# Patient Record
Sex: Male | Born: 1955
Health system: Southern US, Community
[De-identification: ages and names within clinical notes are randomized; demographics above are authoritative.]

## PROBLEM LIST (undated history)

## (undated) DIAGNOSIS — F909 Attention-deficit hyperactivity disorder, unspecified type: Secondary | ICD-10-CM

## (undated) DIAGNOSIS — M1990 Unspecified osteoarthritis, unspecified site: Secondary | ICD-10-CM

## (undated) DIAGNOSIS — N529 Male erectile dysfunction, unspecified: Secondary | ICD-10-CM

## (undated) DIAGNOSIS — T7840XA Allergy, unspecified, initial encounter: Secondary | ICD-10-CM

## (undated) DIAGNOSIS — K648 Other hemorrhoids: Secondary | ICD-10-CM

## (undated) DIAGNOSIS — G44209 Tension-type headache, unspecified, not intractable: Secondary | ICD-10-CM

## (undated) DIAGNOSIS — Z8669 Personal history of other diseases of the nervous system and sense organs: Secondary | ICD-10-CM

## (undated) DIAGNOSIS — K602 Anal fissure, unspecified: Secondary | ICD-10-CM

## (undated) DIAGNOSIS — G479 Sleep disorder, unspecified: Secondary | ICD-10-CM

## (undated) DIAGNOSIS — K649 Unspecified hemorrhoids: Secondary | ICD-10-CM

## (undated) DIAGNOSIS — R480 Dyslexia and alexia: Secondary | ICD-10-CM

## (undated) HISTORY — DX: Tension-type headache, unspecified, not intractable: G44.209

## (undated) HISTORY — DX: Allergy, unspecified, initial encounter: T78.40XA

## (undated) HISTORY — DX: Anal fissure, unspecified: K60.2

## (undated) HISTORY — DX: Sleep disorder, unspecified: G47.9

## (undated) HISTORY — DX: Unspecified hemorrhoids: K64.9

## (undated) HISTORY — DX: Unspecified osteoarthritis, unspecified site: M19.90

## (undated) HISTORY — DX: Personal history of other diseases of the nervous system and sense organs: Z86.69

## (undated) HISTORY — DX: Male erectile dysfunction, unspecified: N52.9

## (undated) HISTORY — PX: HEMORRHOID BANDING: SHX5850

## (undated) HISTORY — DX: Other hemorrhoids: K64.8

## (undated) HISTORY — PX: TONSILLECTOMY: SUR1361

## (undated) HISTORY — DX: Attention-deficit hyperactivity disorder, unspecified type: F90.9

## (undated) HISTORY — PX: APPENDECTOMY: SHX54

## (undated) HISTORY — DX: Dyslexia and alexia: R48.0

---

## 1998-03-19 ENCOUNTER — Emergency Department (HOSPITAL_COMMUNITY): Admission: EM | Admit: 1998-03-19 | Discharge: 1998-03-19 | Payer: Self-pay | Admitting: Emergency Medicine

## 2002-10-13 ENCOUNTER — Encounter: Admission: RE | Admit: 2002-10-13 | Discharge: 2002-10-13 | Payer: Self-pay | Admitting: Family Medicine

## 2002-10-13 ENCOUNTER — Encounter: Payer: Self-pay | Admitting: Family Medicine

## 2004-08-24 HISTORY — PX: COLONOSCOPY: SHX174

## 2005-01-07 ENCOUNTER — Ambulatory Visit: Payer: Self-pay | Admitting: Gastroenterology

## 2005-01-27 ENCOUNTER — Ambulatory Visit: Payer: Self-pay | Admitting: Gastroenterology

## 2005-02-05 ENCOUNTER — Ambulatory Visit: Payer: Self-pay | Admitting: Gastroenterology

## 2005-02-05 LAB — HM COLONOSCOPY: HM Colonoscopy: NORMAL

## 2006-03-19 ENCOUNTER — Ambulatory Visit: Payer: Self-pay | Admitting: Family Medicine

## 2006-04-20 ENCOUNTER — Ambulatory Visit: Payer: Self-pay | Admitting: Family Medicine

## 2006-08-27 ENCOUNTER — Ambulatory Visit: Payer: Self-pay | Admitting: Family Medicine

## 2006-09-14 ENCOUNTER — Ambulatory Visit: Payer: Self-pay | Admitting: Family Medicine

## 2006-11-22 ENCOUNTER — Ambulatory Visit: Payer: Self-pay | Admitting: Family Medicine

## 2007-06-30 ENCOUNTER — Ambulatory Visit: Payer: Self-pay | Admitting: Family Medicine

## 2007-07-18 ENCOUNTER — Ambulatory Visit: Payer: Self-pay | Admitting: Family Medicine

## 2007-09-06 ENCOUNTER — Ambulatory Visit: Payer: Self-pay | Admitting: Family Medicine

## 2007-09-21 ENCOUNTER — Ambulatory Visit: Payer: Self-pay | Admitting: Family Medicine

## 2007-10-12 ENCOUNTER — Ambulatory Visit: Payer: Self-pay | Admitting: Family Medicine

## 2007-10-27 ENCOUNTER — Ambulatory Visit: Payer: Self-pay | Admitting: Family Medicine

## 2008-02-17 ENCOUNTER — Ambulatory Visit: Payer: Self-pay | Admitting: Family Medicine

## 2008-08-28 ENCOUNTER — Ambulatory Visit: Payer: Self-pay | Admitting: Family Medicine

## 2008-09-04 ENCOUNTER — Ambulatory Visit: Payer: Self-pay | Admitting: Family Medicine

## 2008-11-13 ENCOUNTER — Ambulatory Visit: Payer: Self-pay | Admitting: Family Medicine

## 2009-01-25 ENCOUNTER — Ambulatory Visit: Payer: Self-pay | Admitting: Family Medicine

## 2009-02-15 ENCOUNTER — Ambulatory Visit: Payer: Self-pay | Admitting: Family Medicine

## 2009-02-19 ENCOUNTER — Telehealth: Payer: Self-pay | Admitting: Gastroenterology

## 2009-02-20 ENCOUNTER — Ambulatory Visit: Payer: Self-pay | Admitting: Gastroenterology

## 2009-02-20 DIAGNOSIS — K625 Hemorrhage of anus and rectum: Secondary | ICD-10-CM | POA: Insufficient documentation

## 2009-02-20 DIAGNOSIS — K602 Anal fissure, unspecified: Secondary | ICD-10-CM | POA: Insufficient documentation

## 2009-05-22 ENCOUNTER — Ambulatory Visit: Payer: Self-pay | Admitting: Family Medicine

## 2009-07-01 ENCOUNTER — Ambulatory Visit: Payer: Self-pay | Admitting: Family Medicine

## 2009-07-15 ENCOUNTER — Ambulatory Visit: Payer: Self-pay | Admitting: Family Medicine

## 2009-12-20 ENCOUNTER — Ambulatory Visit: Payer: Self-pay | Admitting: Family Medicine

## 2010-03-11 ENCOUNTER — Ambulatory Visit: Payer: Self-pay | Admitting: Family Medicine

## 2010-07-08 ENCOUNTER — Ambulatory Visit: Payer: Self-pay | Admitting: Family Medicine

## 2010-09-10 ENCOUNTER — Encounter
Admission: RE | Admit: 2010-09-10 | Discharge: 2010-09-10 | Payer: Self-pay | Source: Home / Self Care | Attending: Family Medicine | Admitting: Family Medicine

## 2010-09-10 ENCOUNTER — Ambulatory Visit
Admission: RE | Admit: 2010-09-10 | Discharge: 2010-09-10 | Payer: Self-pay | Source: Home / Self Care | Attending: Family Medicine | Admitting: Family Medicine

## 2010-09-14 ENCOUNTER — Encounter: Payer: Self-pay | Admitting: Family Medicine

## 2010-09-23 NOTE — Progress Notes (Signed)
Summary: TRIAGE  Phone Note From Other Clinic Call back at [phone]   Initial call taken by: Guadlupe Spanish [facility],  February 19, 2009 2:04 PM Caller: [email or web address]@ DR Susann Givens Call For: [facility]  Reason for Call: Schedule Patient Appt Summary of Call: would like pt seen sooner than 7/26 pt has an anal fissure  Initial call taken by: Guadlupe Spanish [facility],  February 19, 2009 2:05 PM  Follow-up for Phone Call        Appt. rescheduled to 02-20-09 at 9am with [facility]. Anna with Dr.Lalonde, will advise pt. of new appt. date/time, med.list/co-pay/cx. policy.   Follow-up by: Laureen Ochs LPN,  February 19, 2009 4:20 PM

## 2010-09-23 NOTE — Assessment & Plan Note (Signed)
Summary: ANAL FISSURE            (DR.KAPLAN PT.)     DEBORAH   History of Present Illness Visit Type: Initial Consult Primary GI MD: Melvia Heaps MD [facility] Primary Provider: Billy Fischer Chief Complaint: anal fissures History of Present Illness:   55 YO MALE KNOWN TO DR Arlyce Dice FROM PRIOR EVALUATION IN 2006 FOR RECTAL BLEEDING. COLONOSCOPY 6/06 WAS NORMAL. HE IS REFERRED TODAY FOR SEVERAL MONTH HX OF ANAL IRRITATION, INTERMITTENT BLEEDING OF SMALL AMTS OF BRB. HE DENIES ANY ABDOMINAL PAIN OR CHANGE IN BOWEL HABITS. HAS BM once daily, NO EXCESSIVE STRAINING ETC. HE SAYS HE HAS AREAS OF RAWNESS THAT SEEM TO COME AND GO, PLACES THAT OPEN THEN CLEAR. HE USES PREPH WITH SOME BENEFIT. HE WAS SEEN BY DR Susann Givens 6/4 ;FELT TO HAVE A FISSURE AT 12 AND 6 POSITION, NO DEFINITE WARTS.   GI Review of Systems      Denies abdominal pain, acid reflux, belching, bloating, chest pain, dysphagia with liquids, dysphagia with solids, heartburn, loss of appetite, nausea, vomiting, vomiting blood, and  weight loss.      Reports rectal bleeding and  rectal pain.     Denies anal fissure, black tarry stools, change in bowel habit, constipation, diarrhea, diverticulosis, fecal incontinence, heme positive stool, hemorrhoids, irritable bowel syndrome, jaundice, light color stool, and  liver problems.   Current Medications (verified): 1)  Fexofenadine Hcl 180 Mg Tabs (Fexofenadine Hcl) .Marland Kitchen.. 1 By Mouth Once Daily 2)  Zolpidem Tartrate 10 Mg Tabs (Zolpidem Tartrate) .... As Needed 3)  Concerta 36 Mg Cr-Tabs (Methylphenidate Hcl) .Marland Kitchen.. 1 By Mouth Once Daily 4)  Nefazodone Hcl 100 Mg Tabs (Nefazodone Hcl) .Marland Kitchen.. 1 By Mouth Once Daily 5)  Topamax 25 Mg Tabs (Topiramate) .... 2 By Mouth At Bedtime  Allergies (verified): No Known Drug Allergies  Past History:  Past Medical History: CHRONIC HEADACHES/MIGRAINES SEASONAL ALLERGIES  Past Surgical History: Appendectomy  Family History: No FH of Colon Cancer:  Social  History: Patient has never smoked.  Alcohol Use - no Illicit Drug Use - no Patient gets regular exercise. SINGLE Smoking Status:  never Drug Use:  no Does Patient Exercise:  yes  Review of Systems  The patient denies allergy/sinus, anemia, anxiety-new, arthritis/joint pain, back pain, blood in urine, breast changes/lumps, change in vision, confusion, cough, coughing up blood, depression-new, fainting, fatigue, fever, headaches-new, hearing problems, heart murmur, heart rhythm changes, itching, menstrual pain, muscle pains/cramps, night sweats, nosebleeds, pregnancy symptoms, shortness of breath, skin rash, sleeping problems, sore throat, swelling of feet/legs, swollen lymph glands, thirst - excessive , urination - excessive , urination changes/pain, urine leakage, vision changes, and voice change.    Vital Signs:  Patient profile:   55 year old male Height:      72 inches Weight:      191.4 pounds BMI:     26.05 Pulse rate:   78 / minute Pulse rhythm:   regular BP sitting:   118 / 82  (left arm)  Vitals Entered By: Merri Ray CMA (February 20, 2009 9:01 AM)  Physical Exam  General:  Well developed, well nourished, no acute distress. Head:  Normocephalic and atraumatic. Eyes:  PERRLA, no icterus. Lungs:  Clear throughout to auscultation. Heart:  Regular rate and rhythm; no murmurs, rubs,  or bruits. Abdomen:  SOFT,NONTENDER, NO MASS OR HSM ,BS+ Rectal:  RECTAL EXAM AND ANOSCOPY;SMALL  SHALLOW FISSURE AT 6 OCLOCK,2 OTHER SMALL PERIANAL TEARS,NONTENDER EXAM Msk:  Symmetrical with no gross  deformities. Normal posture. Neurologic:  Alert and  oriented x4;  grossly normal neurologically. Psych:  Alert and cooperative. Normal mood and affect.   Impression & Recommendations:  Problem # 1:  ANAL FISSURE (ICD-565.0) Assessment New 55 YO MALE WITH VERY SMALL ANAL TEARS/FISSURE RECTAL CARE INSTRUCTIONS;AVOID ANY ANAL TRAUMA /INTERCOURSE FOR 2-3 WEEKS MOISTENED TISSUE,USE BALNEOL  LOTION 2-3 TIMES DILY ANALPRAM 2.5 % 2-3 X DAILY  PT ADVISED TO CALL FOR ROV IF SXS DO NOT IMPROVE IN THE NEXT FEW WEEKS.  Problem # 2:  COLON NEOPLASIA SURVEILLANCE Assessment: Comment Only LAST COLON 2006 NORMAL /FOLLOW UP 2016 DR. KAPLAN  Patient Instructions: 1)  We sent a prescription for Analpram cream to [facility]. Use 2-3 times daily x 14 days. Samples provided.  2)  Use the Balneol lotion samples after bowel movements.  Use a moist tissue, baby wipes are good.  You can get the Balneol lotion at the pharmacy, [facility] may have it as well. 3)  Copy sent to : Sharlot Gowda, MD 4)  Call in 3-4 weeks if symptoms not resolved and we will make you an appointment to come in.   5)  The medication list was reviewed and reconciled.  All changed / newly prescribed medications were explained.  A complete medication list was provided to the patient / caregiver.  Prescriptions: ANALPRAM-HC 1-2.5 % CREA (HYDROCORTISONE ACE-PRAMOXINE) Use 2-3 times daily x 14 days then as needed  #1 tube x 0   Entered by:   Lowry Ram CMA   Authorized by:   Sammuel Cooper PA-c   Signed by:   Lowry Ram CMA on 02/20/2009   Method used:   Electronically to        [facility]* (retail)       [address]       [place], [place]  [id number]       Ph: [phone]       Fax: [phone]   RxID:   [id number]

## 2010-09-25 ENCOUNTER — Ambulatory Visit (INDEPENDENT_AMBULATORY_CARE_PROVIDER_SITE_OTHER): Payer: BC Managed Care – PPO | Admitting: Family Medicine

## 2010-09-25 DIAGNOSIS — R51 Headache: Secondary | ICD-10-CM

## 2010-09-25 DIAGNOSIS — Z209 Contact with and (suspected) exposure to unspecified communicable disease: Secondary | ICD-10-CM

## 2010-10-02 ENCOUNTER — Other Ambulatory Visit: Payer: Self-pay | Admitting: Neurology

## 2010-10-02 DIAGNOSIS — G4453 Primary thunderclap headache: Secondary | ICD-10-CM

## 2010-10-03 ENCOUNTER — Other Ambulatory Visit: Payer: BC Managed Care – PPO

## 2010-10-03 ENCOUNTER — Ambulatory Visit
Admission: RE | Admit: 2010-10-03 | Discharge: 2010-10-03 | Disposition: A | Discharge status: Home / Self Care | Payer: BC Managed Care – PPO | Source: Ambulatory Visit | Attending: Neurology | Admitting: Neurology

## 2010-10-03 DIAGNOSIS — G4453 Primary thunderclap headache: Secondary | ICD-10-CM

## 2010-10-03 MED ORDER — GADOBENATE DIMEGLUMINE 529 MG/ML IV SOLN: 17.0000 mL | Freq: Once | INTRAVENOUS | Status: AC | PRN

## 2010-10-13 ENCOUNTER — Other Ambulatory Visit: Payer: Self-pay | Admitting: Neurology

## 2010-10-13 ENCOUNTER — Ambulatory Visit
Admission: RE | Admit: 2010-10-13 | Discharge: 2010-10-13 | Disposition: A | Discharge status: Home / Self Care | Payer: BC Managed Care – PPO | Source: Ambulatory Visit | Attending: Neurology | Admitting: Neurology

## 2010-10-13 DIAGNOSIS — M542 Cervicalgia: Secondary | ICD-10-CM

## 2011-01-06 ENCOUNTER — Telehealth: Payer: Self-pay | Admitting: Family Medicine

## 2011-02-06 NOTE — Telephone Encounter (Signed)
DONE

## 2011-03-16 ENCOUNTER — Other Ambulatory Visit: Payer: Self-pay

## 2011-03-16 MED ORDER — BUPROPION HCL ER (SR) 150 MG PO TB12: 150.0000 mg | ORAL_TABLET | Freq: Every day | ORAL | Status: DC

## 2011-03-17 ENCOUNTER — Other Ambulatory Visit: Payer: Self-pay | Admitting: Family Medicine

## 2011-03-17 MED ORDER — METHYLPHENIDATE HCL ER (OSM) 36 MG PO TBCR: 36.0000 mg | EXTENDED_RELEASE_TABLET | Freq: Every day | ORAL | Status: DC

## 2011-04-06 ENCOUNTER — Ambulatory Visit (INDEPENDENT_AMBULATORY_CARE_PROVIDER_SITE_OTHER): Payer: BC Managed Care – PPO | Admitting: Family Medicine

## 2011-04-06 ENCOUNTER — Encounter: Payer: Self-pay | Admitting: Family Medicine

## 2011-04-06 ENCOUNTER — Other Ambulatory Visit: Payer: Self-pay | Admitting: Family Medicine

## 2011-04-06 VITALS — BP 118/82 | HR 73 | Temp 97.8°F | Wt 188.0 lb

## 2011-04-06 DIAGNOSIS — K219 Gastro-esophageal reflux disease without esophagitis: Secondary | ICD-10-CM

## 2011-04-06 DIAGNOSIS — R5383 Other fatigue: Secondary | ICD-10-CM

## 2011-04-06 DIAGNOSIS — R5381 Other malaise: Secondary | ICD-10-CM

## 2011-04-06 LAB — CBC WITH DIFFERENTIAL/PLATELET
Basophils Absolute: 0 10*3/uL (ref 0.0–0.1)
Basophils Relative: 0 % (ref 0–1)
Eosinophils Absolute: 0.1 10*3/uL (ref 0.0–0.7)
Eosinophils Relative: 1 % (ref 0–5)
HCT: 44.7 % (ref 39.0–52.0)
Hemoglobin: 15.8 g/dL (ref 13.0–17.0)
Lymphocytes Relative: 29 % (ref 12–46)
Lymphs Abs: 2.3 10*3/uL (ref 0.7–4.0)
MCH: 29.8 pg (ref 26.0–34.0)
MCHC: 35.3 g/dL (ref 30.0–36.0)
MCV: 84.2 fL (ref 78.0–100.0)
Monocytes Absolute: 0.8 10*3/uL (ref 0.1–1.0)
Monocytes Relative: 10 % (ref 3–12)
Neutro Abs: 4.7 10*3/uL (ref 1.7–7.7)
Neutrophils Relative %: 59 % (ref 43–77)
Platelets: 224 10*3/uL (ref 150–400)
RBC: 5.31 MIL/uL (ref 4.22–5.81)
RDW: 12.2 % (ref 11.5–15.5)
WBC: 8 10*3/uL (ref 4.0–10.5)

## 2011-04-06 LAB — T4: T4, Total: 9 ug/dL (ref 5.0–12.5)

## 2011-04-06 LAB — COMPREHENSIVE METABOLIC PANEL
ALT: 15 U/L (ref 0–53)
AST: 17 U/L (ref 0–37)
Albumin: 4.8 g/dL (ref 3.5–5.2)
Alkaline Phosphatase: 49 U/L (ref 39–117)
BUN: 13 mg/dL (ref 6–23)
CO2: 24 mEq/L (ref 19–32)
Calcium: 9.9 mg/dL (ref 8.4–10.5)
Chloride: 106 mEq/L (ref 96–112)
Creat: 1.03 mg/dL (ref 0.50–1.35)
Glucose, Bld: 80 mg/dL (ref 70–99)
Potassium: 4.1 mEq/L (ref 3.5–5.3)
Sodium: 138 mEq/L (ref 135–145)
Total Bilirubin: 0.4 mg/dL (ref 0.3–1.2)
Total Protein: 6.4 g/dL (ref 6.0–8.3)

## 2011-04-06 LAB — T3: T3, Total: 112.2 ng/dL (ref 80.0–204.0)

## 2011-04-06 LAB — TSH: TSH: 2.013 u[IU]/mL (ref 0.350–4.500)

## 2011-04-06 LAB — TESTOSTERONE: Testosterone: 619.49 ng/dL (ref 250–890)

## 2011-04-06 NOTE — Patient Instructions (Signed)
Take 2 Prilosec every night before you go to bed

## 2011-04-06 NOTE — Progress Notes (Signed)
  Subjective:    Patient ID: Parker Silva, male    DOB: 1956/04/06, 55 y.o.   MRN: [id number]  HPI He complains of a six-month history of having increasing difficulty with hoarse voice especially after he uses his voice for long period of time. He also complains of decreased stamina as well as strength and fatigue. He has had intermittent fever and chills. He is also noted difficulty with a cough mainly in the evenings. He also complains of difficulty with not being able to lose weight. He has had no skin or hair changes. He does not describe hot or cold intolerance.   Review of Systems Negative except as above    Objective:   Physical Exam alert and in no distress. Tympanic membranes and canals are normal. Throat is clear. Tonsils are normal. Neck is supple without adenopathy or thyromegaly. Cardiac exam shows a regular sinus rhythm without murmurs or gallops. Lungs are clear to auscultation. Abdominal exam shows no masses or tenderness. Reflexes are normal.        Assessment & Plan:  Fatigue. Fever or chills. Hoarse voice. Routine blood screening including thyroid function and testosterone. Also recommend he try Prilosec 40 mg at night.

## 2011-04-07 LAB — HIV ANTIBODY (ROUTINE TESTING W REFLEX): HIV: NONREACTIVE

## 2011-04-07 MED ORDER — AMOXICILLIN-POT CLAVULANATE 875-125 MG PO TABS: 1.0000 | ORAL_TABLET | Freq: Two times a day (BID) | ORAL | Status: AC

## 2011-04-07 NOTE — Progress Notes (Signed)
Addended by: Ronnald Nian on: 04/07/2011 01:21 PM   Modules accepted: Orders

## 2011-04-07 NOTE — Progress Notes (Signed)
  Subjective:    Patient ID: Parker Silva, male    DOB: 05/20/1956, 55 y.o.   MRN: [id number]  HPI    Review of Systems     Objective:   Physical Exam        Assessment & Plan:  His blood work came back essentially normal. I discussed this with him in detail. Although there is no clear indication for an infection, I will place him on Augmentin and see how he does.

## 2011-04-15 ENCOUNTER — Telehealth: Payer: Self-pay | Admitting: Family Medicine

## 2011-04-16 NOTE — Telephone Encounter (Signed)
Schedule him to come back for an exam whenever it's good for his schedule.

## 2011-04-17 ENCOUNTER — Ambulatory Visit (INDEPENDENT_AMBULATORY_CARE_PROVIDER_SITE_OTHER): Payer: BC Managed Care – PPO | Admitting: Family Medicine

## 2011-04-17 ENCOUNTER — Telehealth: Payer: Self-pay

## 2011-04-17 ENCOUNTER — Encounter: Payer: Self-pay | Admitting: Family Medicine

## 2011-04-17 VITALS — BP 110/70 | HR 67 | Wt 185.0 lb

## 2011-04-17 DIAGNOSIS — N4889 Other specified disorders of penis: Secondary | ICD-10-CM

## 2011-04-17 DIAGNOSIS — N489 Disorder of penis, unspecified: Secondary | ICD-10-CM

## 2011-04-17 DIAGNOSIS — R0602 Shortness of breath: Secondary | ICD-10-CM

## 2011-04-17 DIAGNOSIS — R5381 Other malaise: Secondary | ICD-10-CM

## 2011-04-17 DIAGNOSIS — R5383 Other fatigue: Secondary | ICD-10-CM

## 2011-04-17 LAB — RPR

## 2011-04-17 LAB — SEDIMENTATION RATE: Sed Rate: 1 mm/hr (ref 0–16)

## 2011-04-17 LAB — C-REACTIVE PROTEIN: CRP: 0.1 mg/dL (ref <0.6)

## 2011-04-17 LAB — CK: Total CK: 111 U/L (ref 7–232)

## 2011-04-17 MED ORDER — TRIAMCINOLONE ACETONIDE 0.5 % EX OINT: TOPICAL_OINTMENT | Freq: Two times a day (BID) | CUTANEOUS | Status: DC

## 2011-04-17 NOTE — Telephone Encounter (Signed)
Called pt to let him know Dr.Lalonde would like for him to make appt left message

## 2011-04-17 NOTE — Progress Notes (Signed)
  Subjective:    Patient ID: Renee Rival, male    DOB: 08-08-1956, 55 y.o.   MRN: [id number]  HPI He is here for recheck. He is really noted no improvement since being placed on the Augmentin. He continues to complain of myalgias, fatigue, weakness. He has had no chest pain, earache, sore throat or shortness of breath. He continues to have difficulty with a lesion present near the glans penis that does respond to steroids but does not completely go away.   Review of Systems     Objective:   Physical Exam alert and in no distress. Tympanic membranes and canals are normal. Throat is clear. Tonsils are normal. Neck is supple without adenopathy or thyromegaly. Cardiac exam shows a regular sinus rhythm without murmurs or gallops. Lungs are clear to auscultation. Abdominal exam shows no masses or tenderness with normal bowel sounds. Penile exam does show a linear fissure-type lesion.       Assessment & Plan:  Etiology of his symptoms is unclear. Penile lesion  I will do more blood work including CMP, sedimentation rate, CRP, CPK, Lyme disease titer. If these are not helpful, I will refer to infectious disease.

## 2011-04-20 ENCOUNTER — Telehealth: Payer: Self-pay

## 2011-04-20 LAB — CYTOMEGALOVIRUS ANTIBODY, IGG: Cytomegalovirus(CMV) Antibody, IgG: POSITIVE — AB

## 2011-04-20 LAB — B. BURGDORFI ANTIBODIES: B burgdorferi Ab IgG+IgM: 0.16 {ISR}

## 2011-04-20 NOTE — Telephone Encounter (Signed)
Called pt to advise him of appt with Dr.Truslow Sept. 4 @ 10;30 am he  Wants to talk to you first

## 2011-04-20 NOTE — Telephone Encounter (Signed)
Got pt appt with Dr.Truslow sept 4 @ 10:30 pt dosent want appt till he talks with Dr.Lalonde

## 2011-04-25 ENCOUNTER — Other Ambulatory Visit: Payer: Self-pay | Admitting: Family Medicine

## 2011-05-02 ENCOUNTER — Other Ambulatory Visit: Payer: Self-pay | Admitting: Family Medicine

## 2011-05-02 MED ORDER — AZITHROMYCIN 500 MG PO TABS: 500.0000 mg | ORAL_TABLET | Freq: Every day | ORAL | Status: AC

## 2011-05-19 ENCOUNTER — Encounter: Payer: Self-pay | Admitting: Family Medicine

## 2011-05-20 ENCOUNTER — Ambulatory Visit: Payer: BC Managed Care – PPO | Admitting: Family Medicine

## 2011-05-22 ENCOUNTER — Ambulatory Visit: Payer: BC Managed Care – PPO | Admitting: Family Medicine

## 2011-05-26 ENCOUNTER — Encounter: Payer: Self-pay | Admitting: Family Medicine

## 2011-05-26 ENCOUNTER — Ambulatory Visit (INDEPENDENT_AMBULATORY_CARE_PROVIDER_SITE_OTHER): Payer: BC Managed Care – PPO | Admitting: Family Medicine

## 2011-05-26 VITALS — BP 114/78 | HR 80 | Wt 180.0 lb

## 2011-05-26 DIAGNOSIS — N489 Disorder of penis, unspecified: Secondary | ICD-10-CM

## 2011-05-26 DIAGNOSIS — F909 Attention-deficit hyperactivity disorder, unspecified type: Secondary | ICD-10-CM

## 2011-05-26 DIAGNOSIS — N4889 Other specified disorders of penis: Secondary | ICD-10-CM

## 2011-05-26 MED ORDER — SILDENAFIL CITRATE 100 MG PO TABS: 100.0000 mg | ORAL_TABLET | ORAL | Status: DC | PRN

## 2011-05-26 MED ORDER — METHYLPHENIDATE HCL ER (OSM) 27 MG PO TBCR: 27.0000 mg | EXTENDED_RELEASE_TABLET | Freq: Every day | ORAL | Status: DC

## 2011-05-26 NOTE — Patient Instructions (Signed)
Call me next week and let me know how the reduced dosing of Concerta is working.

## 2011-05-26 NOTE — Progress Notes (Signed)
  Subjective:    Patient ID: Parker Silva, male    DOB: 1956/02/28, 55 y.o.   MRN: [id number]  HPI He is here for consultation. He was recently seen by rheumatology. The note was reviewed and indicates that his ADD medication might possibly be causing his sleep disturbance. He also continues to have difficulty with a penile lesion that does tend to respond to steroid but does not go away completely.  Review of Systems     Objective:   Physical Exam Alert and in no distress. Exam of the penis does show a healing lesion present on the left side near the glans       Assessment & Plan:  Penile lesion, etiology unclear.  I will refer to dermatology to have a lesion further evaluated. ADD. Sleep disturbance I discussed various options with him concerning his ADD and the use of other medicines or possibly reducing the dosing on his present medicine. At this time he would like to try a reduced dose of the Concerta. If this is not beneficial, I will entertain the use of other ADD meds.

## 2011-06-17 ENCOUNTER — Other Ambulatory Visit: Payer: Self-pay | Admitting: Family Medicine

## 2011-06-17 NOTE — Telephone Encounter (Signed)
rx refill for Requip.

## 2011-07-20 ENCOUNTER — Telehealth: Payer: Self-pay | Admitting: Family Medicine

## 2011-07-20 MED ORDER — METHYLPHENIDATE HCL ER (OSM) 27 MG PO TBCR: 27.0000 mg | EXTENDED_RELEASE_TABLET | Freq: Every day | ORAL | Status: DC

## 2011-07-20 NOTE — Telephone Encounter (Signed)
Concerta refilled.  

## 2011-07-20 NOTE — Telephone Encounter (Signed)
DONE

## 2011-10-19 ENCOUNTER — Telehealth: Payer: Self-pay | Admitting: Internal Medicine

## 2011-10-19 MED ORDER — METHYLPHENIDATE HCL ER (OSM) 36 MG PO TBCR: 36.0000 mg | EXTENDED_RELEASE_TABLET | ORAL | Status: DC

## 2011-10-19 NOTE — Telephone Encounter (Signed)
Concerta renewed at 36 mg. Apparently the 27 mg not strong enough.

## 2011-11-17 ENCOUNTER — Other Ambulatory Visit: Payer: Self-pay | Admitting: Family Medicine

## 2011-11-30 ENCOUNTER — Encounter: Payer: Self-pay | Admitting: Family Medicine

## 2011-11-30 ENCOUNTER — Ambulatory Visit (INDEPENDENT_AMBULATORY_CARE_PROVIDER_SITE_OTHER): Payer: BC Managed Care – PPO | Admitting: Family Medicine

## 2011-11-30 VITALS — BP 108/72 | HR 72 | Ht 72.0 in | Wt 181.0 lb

## 2011-11-30 DIAGNOSIS — IMO0002 Reserved for concepts with insufficient information to code with codable children: Secondary | ICD-10-CM

## 2011-11-30 DIAGNOSIS — M545 Low back pain, unspecified: Secondary | ICD-10-CM

## 2011-11-30 DIAGNOSIS — M5416 Radiculopathy, lumbar region: Secondary | ICD-10-CM

## 2011-11-30 LAB — POCT URINALYSIS DIPSTICK
Bilirubin, UA: NEGATIVE
Blood, UA: NEGATIVE
Glucose, UA: NEGATIVE
Ketones, UA: NEGATIVE
Leukocytes, UA: NEGATIVE
Nitrite, UA: NEGATIVE
Protein, UA: NEGATIVE
Spec Grav, UA: 1.02
Urobilinogen, UA: NEGATIVE
pH, UA: 7

## 2011-11-30 MED ORDER — METHYLPREDNISOLONE 4 MG PO KIT: PACK | ORAL | Status: AC

## 2011-11-30 MED ORDER — HYDROCODONE-ACETAMINOPHEN 5-500 MG PO TABS: ORAL_TABLET | ORAL | Status: DC

## 2011-11-30 NOTE — Patient Instructions (Signed)
Take the steroids as directed. Use the vicodin as needed for severe pain.  This may make you sleepy--back off on the trazadone so you aren't too drowsy from taking both together.   Do not take any other pain medications (ie no ketoprofen), and you can EITHER take Tylenol OR the vicodin/pain pill--the pain pill contains tylenol, so don't take both together or there is increased risk for liver toxicity.  Continue with heat and/or ice, and gentle stretches.

## 2011-11-30 NOTE — Progress Notes (Signed)
Chief complaint:  bent over on Friday and twisted and hurt his back. Pain was on the left side and down through his hamstring. Now pain is B/L. Tried to obtain urine specimen to perform UA and patient had used restroom while in watiting room  HPI:  Acute onset of sharp pain after standing up from bending down--wasn't lifting anything heavy.  Pain began 4 days ago.  Tried ketoprofen, which helped some, but was afraid to keep taking. Hot showers helped initially.  Saw chiropractor last week, Friday with partial improvement.  Having ongoing pain in is low back, and into his buttocks and upper hamstrings.  Pain initially was at L lower back (SI joint)--this pain improved after manipulation at chiropractor.  Currently pain is across the sacrum, and radiates around to the ASIS (anterior hips), and runs down from the buttocks, down the back of the hamstrings.  Hurts to sit, hurts to lie down, can't sit still.  Can't find any position to relieve the pain.  Describes his hips as being "on fire".  Muscular pain in the buttocks, but periodical electric "surges" of pain.  He feels like it started out as muscular, but now in the nerves.  Denies significant weakness, numbness or tingling.  Last dose of ketoprofen was this morning.  Past Medical History  Diagnosis Date  . Allergy     RHINITIS  . ADD (attention deficit disorder)   . Dyslexia   . History of migraine headaches   . Tension headache     Past Surgical History  Procedure Date  . Appendectomy     History   Social History  . Marital Status: Single    Spouse Name: N/A    Number of Children: N/A  . Years of Education: N/A   Occupational History  . 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 Smoking status: Never Smoker   . Smokeless tobacco: Never Used  . Alcohol Use: No  . Drug Use: No  . Sexually Active: Not on file   Other Topics Concern  . Not on file   Social History Narrative  . No narrative on file     History reviewed. No pertinent family history.  Current Outpatient Prescriptions on File Prior to Visit  Medication Sig Dispense Refill  . fexofenadine (ALLEGRA) 180 MG tablet Take 180 mg by mouth daily.        Marland Kitchen FLONASE 50 MCG/ACT nasal spray USE 2 SPRAYS IN EACH NOSTRIL ONCE DAILY  16 g  1  . ketoprofen (ORUDIS) 50 MG capsule Take 50 mg by mouth 3 (three) times daily as needed.        . methylphenidate (CONCERTA) 36 MG CR tablet Take 1 tablet (36 mg total) by mouth every morning.  30 tablet  0  . methylphenidate (CONCERTA) 36 MG CR tablet Take 1 tablet (36 mg total) by mouth every morning.  30 tablet  0  . rizatriptan (MAXALT) 10 MG tablet Take 10 mg by mouth as needed. May repeat in 2 hours if needed       . sildenafil (VIAGRA) 100 MG tablet Take 1 tablet (100 mg total) by mouth as needed for erectile dysfunction.  4 tablet  11  . topiramate (TOPAMAX) 25 MG capsule Take 50 mg by mouth daily.       . traZODone (DESYREL) 50 MG tablet Take 200 mg by mouth at bedtime.      Brigitte Pulse SR 150 MG 12 hr tablet  TAKE 1 TABLET ONCE DAILY.  60 each  11  . methylphenidate (CONCERTA) 36 MG CR tablet Take 1 tablet (36 mg total) by mouth 1 day or 1 dose.  30 tablet  0    No Known Allergies  ROS:  Denies fevers, urinary complaints, hematuria, numbness, tingling, skin rash, bladder or bowel complaints.  PHYSICAL EXAM: BP 108/72  Pulse 72  Ht 6' (1.829 m)  Wt 181 lb (82.101 kg)  BMI 24.55 kg/m2 Considerable discomfort, unable to sit comfortably, periodically moving around room, changing position. Heart: regular rate and rhythm Lungs: clear Back: Tender across lower lumbar spine (L2 down).  No significant muscle spasm in paraspinous muscles or pyriformis muscles.  Also tender L>R SI joint, and tender across both buttock muscles. DTR's are 2+ at both knees, somewhat diminished but symmetric at both ankles.  Normal strength, sensation in lower extremities.  +SLR--pain in low back with testing on  R, and pain into L buttock/hamstring on L Skin without rashes  ASSESSMENT/PLAN: 1. Lumbago  HYDROcodone-acetaminophen (VICODIN) 5-500 MG per tablet, POCT Urinalysis Dipstick  2. Lumbar radiculopathy  methylPREDNISolone (MEDROL, PAK,) 4 MG tablet, POCT Urinalysis Dipstick   Medrol dosepak. Stop ketoprofen while on steroids. No significant spasm noted. Pain pills--risks/side effects of steroids and narcotics reviewed. Stretches/heat recommended  F/u 1 week with Dr. Susann Givens.  If no better, may need further evaluation/imaging, possibly PT

## 2011-12-29 ENCOUNTER — Ambulatory Visit: Payer: BC Managed Care – PPO | Admitting: Family Medicine

## 2012-01-14 ENCOUNTER — Other Ambulatory Visit: Payer: Self-pay | Admitting: Family Medicine

## 2012-01-20 ENCOUNTER — Telehealth: Payer: Self-pay | Admitting: Family Medicine

## 2012-01-20 MED ORDER — METHYLPHENIDATE HCL ER (OSM) 36 MG PO TBCR: 36.0000 mg | EXTENDED_RELEASE_TABLET | ORAL | Status: DC

## 2012-01-20 NOTE — Telephone Encounter (Signed)
Concerta renewed 

## 2012-01-20 NOTE — Telephone Encounter (Signed)
LM

## 2012-02-02 ENCOUNTER — Other Ambulatory Visit: Payer: Self-pay | Admitting: Family Medicine

## 2012-04-22 ENCOUNTER — Telehealth: Payer: Self-pay | Admitting: Family Medicine

## 2012-04-22 ENCOUNTER — Other Ambulatory Visit: Payer: Self-pay | Admitting: Medical

## 2012-04-22 MED ORDER — METHYLPHENIDATE HCL ER (OSM) 36 MG PO TBCR: 36.0000 mg | EXTENDED_RELEASE_TABLET | ORAL | Status: DC

## 2012-04-22 MED ORDER — FLUTICASONE PROPIONATE 50 MCG/ACT NA SUSP: 1.0000 | Freq: Every day | NASAL | Status: DC

## 2012-04-22 NOTE — Telephone Encounter (Signed)
SHANE WROTE SCRIPT. CALLED PT TO ADVISE THAT SCRIPT READY FOR PICK UP

## 2012-04-26 ENCOUNTER — Ambulatory Visit (INDEPENDENT_AMBULATORY_CARE_PROVIDER_SITE_OTHER): Payer: BC Managed Care – PPO | Admitting: Family Medicine

## 2012-04-26 ENCOUNTER — Encounter: Payer: Self-pay | Admitting: Family Medicine

## 2012-04-26 ENCOUNTER — Ambulatory Visit: Payer: BC Managed Care – PPO | Admitting: Family Medicine

## 2012-04-26 VITALS — BP 110/70 | HR 69 | Wt 182.0 lb

## 2012-04-26 DIAGNOSIS — L259 Unspecified contact dermatitis, unspecified cause: Secondary | ICD-10-CM

## 2012-04-26 DIAGNOSIS — F909 Attention-deficit hyperactivity disorder, unspecified type: Secondary | ICD-10-CM

## 2012-04-26 DIAGNOSIS — L309 Dermatitis, unspecified: Secondary | ICD-10-CM

## 2012-04-26 MED ORDER — METHYLPHENIDATE HCL ER (OSM) 36 MG PO TBCR: 36.0000 mg | EXTENDED_RELEASE_TABLET | ORAL | Status: DC

## 2012-04-26 NOTE — Progress Notes (Signed)
  Subjective:    Patient ID: Parker Silva, male    DOB: 11-Feb-1956, 56 y.o.   MRN: [id number]  HPI He is here for evaluation of skin lesions that he is noted over the last several days. He did remove one dear check and is concerned about this but has not developed a rash from it, fever or chills. Most of his lesions on his feet. He did indicate his house to eliminate any possibility of fleas. Does not complain of any itching in his hands or scrotal area. He continues on his Concerta and gets roughly 12 hours from it. He needs to use this every day. He states when he doesn't take it he can have withdrawal symptoms. It does not cause any anorexia.   Review of Systems     Objective:   Physical Exam Alert and in no distress. Exam of the skin does show several erythematous macular lesions present on his ankles but none on his hands. He does have 2 small warts present on one of his right toes. No large erythematous areas noted.       Assessment & Plan:   1. Dermatitis   2. ADHD (attention deficit hyperactivity disorder)    I did renew his Concerta and did get 31 pills for October. Recommend conservative care for the dermatitis.

## 2012-04-26 NOTE — Telephone Encounter (Signed)
TSD  

## 2012-06-21 ENCOUNTER — Telehealth: Payer: Self-pay | Admitting: Family Medicine

## 2012-06-21 NOTE — Telephone Encounter (Signed)
Verbal permission was given for them to fill a prescription of Concerta early.

## 2012-07-19 ENCOUNTER — Other Ambulatory Visit: Payer: Self-pay | Admitting: Family Medicine

## 2012-07-22 ENCOUNTER — Telehealth: Payer: Self-pay | Admitting: Family Medicine

## 2012-07-22 MED ORDER — METHYLPHENIDATE HCL ER (OSM) 36 MG PO TBCR: 36.0000 mg | EXTENDED_RELEASE_TABLET | ORAL | Status: DC

## 2012-07-22 NOTE — Telephone Encounter (Signed)
Needs refill on Concerta, he is out, please call when ready

## 2012-09-13 ENCOUNTER — Other Ambulatory Visit: Payer: Self-pay | Admitting: Family Medicine

## 2012-10-11 ENCOUNTER — Other Ambulatory Visit: Payer: Self-pay | Admitting: Family Medicine

## 2012-10-20 ENCOUNTER — Telehealth: Payer: Self-pay | Admitting: Internal Medicine

## 2012-10-20 MED ORDER — METHYLPHENIDATE HCL ER (OSM) 36 MG PO TBCR: 36.0000 mg | EXTENDED_RELEASE_TABLET | ORAL | Status: DC

## 2012-10-20 NOTE — Telephone Encounter (Signed)
Concerta renewed 

## 2012-12-29 ENCOUNTER — Telehealth: Payer: Self-pay | Admitting: Internal Medicine

## 2012-12-29 MED ORDER — FLUTICASONE PROPIONATE 50 MCG/ACT NA SUSP: NASAL | Status: DC

## 2012-12-29 NOTE — Telephone Encounter (Signed)
Sent in flonase  

## 2012-12-29 NOTE — Telephone Encounter (Signed)
Refill request for fluticasone to [facility]

## 2013-01-19 ENCOUNTER — Telehealth: Payer: Self-pay | Admitting: Family Medicine

## 2013-01-19 MED ORDER — METHYLPHENIDATE HCL ER (OSM) 36 MG PO TBCR: 36.0000 mg | EXTENDED_RELEASE_TABLET | ORAL | Status: DC

## 2013-01-19 NOTE — Telephone Encounter (Signed)
LM

## 2013-01-19 NOTE — Telephone Encounter (Signed)
PT CALLED AND REQUESTED REFILL ON CONCERTA. CALL WHEN READY.

## 2013-03-22 ENCOUNTER — Other Ambulatory Visit: Payer: Self-pay | Admitting: Family Medicine

## 2013-03-22 NOTE — Telephone Encounter (Signed)
IS THIS OK 

## 2013-03-25 ENCOUNTER — Other Ambulatory Visit: Payer: Self-pay | Admitting: Family Medicine

## 2013-04-20 ENCOUNTER — Telehealth: Payer: Self-pay | Admitting: Family Medicine

## 2013-04-20 NOTE — Telephone Encounter (Signed)
rx ready, but he will need f/u with Dr. Susann Givens before he runs out.  No more refills until appt.  Last visit here 9/13

## 2013-04-21 ENCOUNTER — Other Ambulatory Visit: Payer: Self-pay | Admitting: Medical

## 2013-04-21 MED ORDER — METHYLPHENIDATE HCL ER (OSM) 36 MG PO TBCR: 36.0000 mg | EXTENDED_RELEASE_TABLET | ORAL | Status: DC

## 2013-04-21 NOTE — Telephone Encounter (Signed)
Called pt to inform that script ready for pick up and that he will need to schedule F/U appt with Dr Susann Givens in order to get another refill. Pt states he will schedule appt when he comes in to pick up script.

## 2013-04-28 ENCOUNTER — Telehealth: Payer: Self-pay | Admitting: Internal Medicine

## 2013-04-28 MED ORDER — FLUTICASONE PROPIONATE 50 MCG/ACT NA SUSP: NASAL | Status: DC

## 2013-04-28 NOTE — Telephone Encounter (Signed)
Refill request for fluticasone #[id number] to [facility]

## 2013-05-24 ENCOUNTER — Telehealth: Payer: Self-pay | Admitting: Family Medicine

## 2013-05-24 NOTE — Telephone Encounter (Signed)
Pt is requesting a refill on his Concerta.. He will be out tomorrow, but he is coming in for a visit on Fri 05/26/13.  He wanted to go ahead and get a refill before visit so the med. Doesn't throw him off schedule.

## 2013-05-25 ENCOUNTER — Ambulatory Visit: Payer: Self-pay | Admitting: Family Medicine

## 2013-05-25 MED ORDER — METHYLPHENIDATE HCL ER (OSM) 36 MG PO TBCR: 36.0000 mg | EXTENDED_RELEASE_TABLET | ORAL | Status: DC

## 2013-05-26 ENCOUNTER — Encounter: Payer: Self-pay | Admitting: Family Medicine

## 2013-05-26 ENCOUNTER — Ambulatory Visit (INDEPENDENT_AMBULATORY_CARE_PROVIDER_SITE_OTHER): Payer: BC Managed Care – PPO | Admitting: Family Medicine

## 2013-05-26 VITALS — BP 118/72 | HR 74 | Wt 182.0 lb

## 2013-05-26 DIAGNOSIS — I83893 Varicose veins of bilateral lower extremities with other complications: Secondary | ICD-10-CM

## 2013-05-26 DIAGNOSIS — F909 Attention-deficit hyperactivity disorder, unspecified type: Secondary | ICD-10-CM

## 2013-05-26 DIAGNOSIS — Z8669 Personal history of other diseases of the nervous system and sense organs: Secondary | ICD-10-CM

## 2013-05-26 DIAGNOSIS — Z23 Encounter for immunization: Secondary | ICD-10-CM

## 2013-05-26 DIAGNOSIS — N529 Male erectile dysfunction, unspecified: Secondary | ICD-10-CM | POA: Insufficient documentation

## 2013-05-26 DIAGNOSIS — G43009 Migraine without aura, not intractable, without status migrainosus: Secondary | ICD-10-CM | POA: Insufficient documentation

## 2013-05-26 DIAGNOSIS — G479 Sleep disorder, unspecified: Secondary | ICD-10-CM | POA: Insufficient documentation

## 2013-05-26 DIAGNOSIS — J309 Allergic rhinitis, unspecified: Secondary | ICD-10-CM | POA: Insufficient documentation

## 2013-05-26 MED ORDER — SILDENAFIL CITRATE 100 MG PO TABS: ORAL_TABLET | ORAL | Status: DC

## 2013-05-26 NOTE — Progress Notes (Signed)
  Subjective:    Patient ID: Parker Silva, male    DOB: Jan 14, 1956, 58 y.o.   MRN: [id number]  HPI He is here for an ADD check. He continues on Concerta 36 mg. He gets roughly 10 hours worth of benefit out of it. When it wears off he really has no symptoms. He also notes that the Wellbutrin does help with his ADD as well as his mood.  He does have a history of migraine headaches and is being followed at the headache clinic. They seem to be under good control his present medications. He also takes trazodone to help with sleep which also helps with his headaches. He would like a refill on his Viagra. He does have an imaging difficulty with ED. He also complains of bilateral medial calf discomfort when he stands for long periods of time. He also notes swelling in the veins in that area. His work continues to go well. Family and social history is unchanged.  Review of Systems     Objective:   Physical Exam alert and in no distress. Tympanic membranes and canals are normal. Throat is clear. Tonsils are normal. Neck is supple without adenopathy or thyromegaly. Cardiac exam shows a regular sinus rhythm without murmurs or gallops. Lungs are clear to auscultation. Varicosities noted in the medial calf bilaterally       Assessment & Plan:  ADHD (attention deficit hyperactivity disorder)  Need for prophylactic vaccination and inoculation against influenza  History of migraine headaches  Sleep disturbance  Allergic rhinitis  ED (erectile dysfunction)  Need for prophylactic vaccination and inoculation against unspecified single disease  Varicose veins of lower extremities with other complications He will continue on his present medication regimen. I will call inViagra. His immunizations were updated with tetanus and flu. Risks and benefits were discussed.

## 2013-06-23 ENCOUNTER — Other Ambulatory Visit: Payer: Self-pay | Admitting: Family Medicine

## 2013-08-23 ENCOUNTER — Telehealth: Payer: Self-pay | Admitting: Family Medicine

## 2013-08-23 MED ORDER — BUPROPION HCL ER (SR) 150 MG PO TB12: ORAL_TABLET | ORAL | Status: DC

## 2013-08-23 MED ORDER — METHYLPHENIDATE HCL ER (OSM) 36 MG PO TBCR: 36.0000 mg | EXTENDED_RELEASE_TABLET | ORAL | Status: DC

## 2013-08-23 NOTE — Telephone Encounter (Signed)
Concerta and Wellbutrin renewed

## 2013-08-23 NOTE — Telephone Encounter (Signed)
Needs refill Conerta,on last pill    - needs today    Please call when ready

## 2013-09-11 ENCOUNTER — Ambulatory Visit (INDEPENDENT_AMBULATORY_CARE_PROVIDER_SITE_OTHER): Payer: BC Managed Care – PPO | Admitting: Family Medicine

## 2013-09-11 ENCOUNTER — Encounter: Payer: Self-pay | Admitting: Family Medicine

## 2013-09-11 VITALS — BP 120/78 | HR 98 | Wt 185.0 lb

## 2013-09-11 DIAGNOSIS — J309 Allergic rhinitis, unspecified: Secondary | ICD-10-CM

## 2013-09-11 DIAGNOSIS — J019 Acute sinusitis, unspecified: Secondary | ICD-10-CM

## 2013-09-11 MED ORDER — AMOXICILLIN 875 MG PO TABS: 875.0000 mg | ORAL_TABLET | Freq: Two times a day (BID) | ORAL | Status: DC

## 2013-09-11 NOTE — Progress Notes (Signed)
   Subjective:    Patient ID: Parker Silva, male    DOB: December 08, 1955, 58 y.o.   MRN: [id number]  HPI\ He has a two-week history of nasal congestion rhinorrhea, sneezing, postnasal drainage with frontal headache, sore throat, upper tooth discomfort. He also feels as ears are congested. Yesterday he noted purulent nasal and postnasal drainage as well as increased difficulty with fatigue. He does have underlying allergies and continues on treatment for them. He does not smoke.   Review of Systems     Objective:   Physical Exam alert and in no distress. Tympanic membranes and canals are normal. Throat is clear. Tonsils are normal. Neck is supple without adenopathy or thyromegaly. Cardiac exam shows a regular sinus rhythm without murmurs or gallops. Lungs are clear to auscultation. Nasal mucosa is red with tenderness over frontal and maxillary sinuses.       Assessment & Plan:  Allergic rhinitis  Acute sinusitis - Plan: amoxicillin (AMOXIL) 875 MG tablet  Encouraged him to call me if not entirely better when he finishes the course of the antibiotics. He is to continue on his allergy medications.

## 2013-09-11 NOTE — Patient Instructions (Addendum)
Take all the antibiotic and if not totally back to normal at the end give me a call. Stay on your allergy meds

## 2013-09-21 ENCOUNTER — Telehealth: Payer: Self-pay | Admitting: Internal Medicine

## 2013-09-21 MED ORDER — AMOXICILLIN-POT CLAVULANATE 875-125 MG PO TABS: 1.0000 | ORAL_TABLET | Freq: Two times a day (BID) | ORAL | Status: DC

## 2013-09-21 NOTE — Telephone Encounter (Signed)
Pt states that he is not any better and would like another round of antibiotics. Send to [facility]

## 2013-11-22 ENCOUNTER — Telehealth: Payer: Self-pay | Admitting: Family Medicine

## 2013-11-22 MED ORDER — METHYLPHENIDATE HCL ER (OSM) 36 MG PO TBCR: 36.0000 mg | EXTENDED_RELEASE_TABLET | Freq: Every day | ORAL | Status: DC

## 2013-11-22 MED ORDER — METHYLPHENIDATE HCL ER (OSM) 36 MG PO TBCR: 36.0000 mg | EXTENDED_RELEASE_TABLET | ORAL | Status: DC

## 2013-11-22 NOTE — Telephone Encounter (Signed)
Concerta was renewed

## 2013-11-23 ENCOUNTER — Telehealth: Payer: Self-pay | Admitting: Family Medicine

## 2013-11-23 NOTE — Telephone Encounter (Signed)
Called pt reached voice mail lm that rx ready to pick up

## 2014-01-02 ENCOUNTER — Ambulatory Visit (INDEPENDENT_AMBULATORY_CARE_PROVIDER_SITE_OTHER): Payer: BC Managed Care – PPO | Admitting: Family Medicine

## 2014-01-02 ENCOUNTER — Encounter: Payer: Self-pay | Admitting: Family Medicine

## 2014-01-02 VITALS — BP 112/70 | HR 64 | Wt 177.0 lb

## 2014-01-02 DIAGNOSIS — M67919 Unspecified disorder of synovium and tendon, unspecified shoulder: Secondary | ICD-10-CM

## 2014-01-02 DIAGNOSIS — F909 Attention-deficit hyperactivity disorder, unspecified type: Secondary | ICD-10-CM

## 2014-01-02 DIAGNOSIS — M7582 Other shoulder lesions, left shoulder: Secondary | ICD-10-CM

## 2014-01-02 DIAGNOSIS — M719 Bursopathy, unspecified: Secondary | ICD-10-CM

## 2014-01-02 MED ORDER — LIDOCAINE HCL 1 % IJ SOLN
10.0000 mL | Freq: Once | INTRAMUSCULAR | Status: AC
  Administered 2014-01-02: 10 mL via INTRADERMAL

## 2014-01-02 MED ORDER — TRIAMCINOLONE ACETONIDE 40 MG/ML IJ SUSP
40.0000 mg | Freq: Once | INTRAMUSCULAR | Status: AC
  Administered 2014-01-02: 40 mg via INTRAMUSCULAR

## 2014-01-02 NOTE — Progress Notes (Signed)
   Subjective:    Patient ID: Parker Silva, male    DOB: 12-Apr-1956, 58 y.o.   MRN: [id number]  HPI He has a several month history of left shoulder pain. No history of injury to the shoulder. He notes pain with abduction and external rotation as well as abduction and internal rotation. He also notes when he drives and uses his left arm he has more difficulty. He will sometimes wake up at night when he rolls over on the left side. He continues on his Concerta. He gets roughly 10 hours of benefit out of it. He cannot really tell when it wears off. It does not interfere with his eating.   Review of Systems     Objective:   Physical Exam Full motion of the shoulder. There is some pain in the posterior aspect of the left shoulder with motion. The arm test was uncomfortable. Supraspinatus testing was also uncomfortable. No laxity. Neer's and Hawkins test negative.       Assessment & Plan:  Tendinitis of left rotator cuff - Plan: lidocaine (XYLOCAINE) 1 % (with pres) injection 10 mL, triamcinolone acetonide (KENALOG-40) injection 40 mg  ADHD (attention deficit hyperactivity disorder)  after discussion with him he decided to have it injected. The left shoulder was prepped posteriorly with Betadine. 40 mg of Kenalog and 3 cc of Xylocaine was injected into the subacromial bursa without difficulty. Within several minutes he did obtain partial leaf of his symptoms. He will also continue on his ADHD medications.

## 2014-02-21 ENCOUNTER — Other Ambulatory Visit: Payer: Self-pay | Admitting: Family Medicine

## 2014-02-21 ENCOUNTER — Telehealth: Payer: Self-pay | Admitting: Family Medicine

## 2014-02-21 MED ORDER — METHYLPHENIDATE HCL ER (OSM) 36 MG PO TBCR: 36.0000 mg | EXTENDED_RELEASE_TABLET | Freq: Every day | ORAL | Status: DC

## 2014-02-21 MED ORDER — METHYLPHENIDATE HCL ER (OSM) 36 MG PO TBCR: 36.0000 mg | EXTENDED_RELEASE_TABLET | ORAL | Status: DC

## 2014-02-21 NOTE — Telephone Encounter (Signed)
Concerta renewed 

## 2014-02-21 NOTE — Telephone Encounter (Signed)
Called pt to inform that three Concerta 36 MG #31 scripts dated 02/22/14, 03/25/14, 04/25/14. Placed the scripts in the folder for pick up

## 2014-02-22 NOTE — Telephone Encounter (Signed)
Is this okay to refill? 

## 2014-04-25 ENCOUNTER — Telehealth: Payer: Self-pay | Admitting: Family Medicine

## 2014-04-26 ENCOUNTER — Encounter: Payer: Self-pay | Admitting: Family Medicine

## 2014-04-26 ENCOUNTER — Telehealth: Payer: Self-pay

## 2014-04-26 ENCOUNTER — Ambulatory Visit (INDEPENDENT_AMBULATORY_CARE_PROVIDER_SITE_OTHER): Payer: BC Managed Care – PPO | Admitting: Family Medicine

## 2014-04-26 VITALS — BP 102/70 | HR 84 | Wt 173.0 lb

## 2014-04-26 DIAGNOSIS — G8929 Other chronic pain: Secondary | ICD-10-CM

## 2014-04-26 DIAGNOSIS — M542 Cervicalgia: Secondary | ICD-10-CM

## 2014-04-26 DIAGNOSIS — M25512 Pain in left shoulder: Secondary | ICD-10-CM

## 2014-04-26 DIAGNOSIS — M25519 Pain in unspecified shoulder: Secondary | ICD-10-CM

## 2014-04-26 NOTE — Telephone Encounter (Signed)
CALLED TO INFORM PT OF MRI 04/28/14 [facility] AND DR.ZAK SMITH [facility] 05/14/14 AT 10:30 AM LEFT PT WORD FOR WORD MESSAGE

## 2014-04-26 NOTE — Progress Notes (Signed)
   Subjective:    Patient ID: Parker Silva, male    DOB: 08-21-1956, 58 y.o.   MRN: [id number]  HPI He is here for evaluation of continued difficulty with neck pain. He has a several year history of difficulty with this. He has a previous MRI which did show some degenerative changes. Over the last year the pain has gotten worse especially in the last several months. He now complains of pain radiating down his arm to the area of the thumb and index finger. He also complains of dropping issues and pain in her during with his sleep. He has been through physical therapy as well as chiropractic manipulation without much success. He also continues to have difficulty with left shoulder pain. Now he notes any abduction, internal or external rotation will cause some pain. The chiropractor has helped with this but he continues to have trouble.   Review of Systems     Objective:   Physical Exam Alert and complaining of neck pain. Motion in any direction causes discomfort. Negative compression test. Strength is normal. DTRs normal. Sensation is questionably decreased in the C6 distribution in his thumb and index finger. Left shoulder exam shows pain with abduction and any rotation. He complains of posterior shoulder pain. No laxity noted. Drop arm test was negative but Neer's and Hawkins test could not be done due to pain. Review of x-rays from chiropractor indicates significant degenerative changes in the C5-6 area as well as narrowing.      Assessment & Plan:  Chronic neck pain - Plan: MR Cervical Spine Wo Contrast  Left shoulder pain  I will also set him up to have ultrasound of the left shoulder with a Dr. Renella Cunas, [phone]

## 2014-04-26 NOTE — Telephone Encounter (Signed)
Pt made appt for 04/26/14

## 2014-04-27 ENCOUNTER — Telehealth: Payer: Self-pay | Admitting: Family Medicine

## 2014-04-27 MED ORDER — TRAMADOL HCL 50 MG PO TABS: 50.0000 mg | ORAL_TABLET | Freq: Four times a day (QID) | ORAL | Status: DC | PRN

## 2014-04-27 NOTE — Telephone Encounter (Signed)
Pt is lot of pain with shoulder & neck, can't sleep, wants to know if you can give him something for pain and/or muscle relaxer.  Having MRI tomorrow.  Also wants to know what are we doing about the neck, it's hurting badly.  Says appt with Dr. Lewie Loron is 05/14/14, but it's only for his shoulder.   Uses [facility].  Asked that you call him ASAP

## 2014-04-27 NOTE — Telephone Encounter (Signed)
Per JCL called in Tramadol & called pt & advised MRI for neck & [place] appt with Dr Lewie Loron for shoulder.

## 2014-04-28 ENCOUNTER — Ambulatory Visit
Admission: RE | Admit: 2014-04-28 | Discharge: 2014-04-28 | Disposition: A | Discharge status: Home / Self Care | Payer: BC Managed Care – PPO | Source: Ambulatory Visit | Attending: Family Medicine | Admitting: Family Medicine

## 2014-04-28 DIAGNOSIS — M542 Cervicalgia: Principal | ICD-10-CM

## 2014-04-28 DIAGNOSIS — G8929 Other chronic pain: Secondary | ICD-10-CM

## 2014-05-01 ENCOUNTER — Telehealth: Payer: Self-pay | Admitting: Family Medicine

## 2014-05-01 ENCOUNTER — Encounter: Payer: Self-pay | Admitting: Family Medicine

## 2014-05-01 NOTE — Telephone Encounter (Signed)
Pt says has appt at [facility] Dr. Ethelene Hal office 05/07/14 & they can do ultrasound and injection both at this visit.  They need an order for them to do the ultrasound.   Said no need to call him back unless there is a problem with sending the order.

## 2014-05-01 NOTE — Telephone Encounter (Signed)
Cancel the visit with Dr. Katrinka Blazing and send authorization for shoulder ultrasound to evaluate for rotator cuff

## 2014-05-01 NOTE — Progress Notes (Signed)
   Subjective:    Patient ID: Parker Silva, male    DOB: 1955/11/26, 58 y.o.   MRN: [id number]  HPI    Review of Systems     Objective:   Physical Exam        Assessment & Plan:  His MRI did, showing stenosis. He will followup with orthopedics concerning this.

## 2014-05-02 NOTE — Telephone Encounter (Signed)
I HAVE CALLED AND CANCELED APPOINTMENT WITH DR.SMITH

## 2014-05-02 NOTE — Telephone Encounter (Signed)
I have sent note to dr.ramos office for the ultra sound

## 2014-05-08 ENCOUNTER — Telehealth: Payer: Self-pay | Admitting: Family Medicine

## 2014-05-08 NOTE — Telephone Encounter (Signed)
I HAVE TAKEN CARE OF THIS HAD TO SEND TO [facility]

## 2014-05-14 ENCOUNTER — Ambulatory Visit: Payer: BC Managed Care – PPO | Admitting: Family Medicine

## 2014-05-25 ENCOUNTER — Telehealth: Payer: Self-pay | Admitting: Family Medicine

## 2014-05-25 ENCOUNTER — Other Ambulatory Visit: Payer: Self-pay | Admitting: Medical

## 2014-05-25 MED ORDER — METHYLPHENIDATE HCL ER (OSM) 36 MG PO TBCR: 36.0000 mg | EXTENDED_RELEASE_TABLET | ORAL | Status: DC

## 2014-05-25 NOTE — Telephone Encounter (Signed)
Dr. Susann GivensLalonde called and stated this pt called him on his private line. He is requesting that you give this pt 30 days worth today.

## 2014-05-25 NOTE — Telephone Encounter (Signed)
Pt needs refill on Concerta. Pt is completely out. Pt was informed JCL not in and states he cant wait. Please call [phone] when ready.

## 2014-05-25 NOTE — Telephone Encounter (Signed)
done

## 2014-05-25 NOTE — Telephone Encounter (Signed)
Pt informed rx is ready.  

## 2014-06-22 ENCOUNTER — Telehealth: Payer: Self-pay | Admitting: Family Medicine

## 2014-06-22 MED ORDER — METHYLPHENIDATE HCL ER (OSM) 36 MG PO TBCR: 36.0000 mg | EXTENDED_RELEASE_TABLET | Freq: Every day | ORAL | Status: DC

## 2014-06-22 MED ORDER — METHYLPHENIDATE HCL ER (OSM) 36 MG PO TBCR: 36.0000 mg | EXTENDED_RELEASE_TABLET | ORAL | Status: DC

## 2014-06-22 NOTE — Telephone Encounter (Signed)
Pt picked up Rx's & states only has 1 pill left.  Called pharmacy & verified that pt only got #30 pills & gave ok to fill early per JCL-lm

## 2014-06-22 NOTE — Telephone Encounter (Signed)
Pt called and requested a refill on concerta. Please call [phone] when ready.

## 2014-07-03 ENCOUNTER — Encounter: Payer: Self-pay | Admitting: Family Medicine

## 2014-07-03 ENCOUNTER — Ambulatory Visit (INDEPENDENT_AMBULATORY_CARE_PROVIDER_SITE_OTHER): Payer: BC Managed Care – PPO | Admitting: Family Medicine

## 2014-07-03 VITALS — BP 120/72 | HR 86 | Wt 166.0 lb

## 2014-07-03 DIAGNOSIS — R634 Abnormal weight loss: Secondary | ICD-10-CM

## 2014-07-03 DIAGNOSIS — M25512 Pain in left shoulder: Secondary | ICD-10-CM

## 2014-07-03 LAB — CBC WITH DIFFERENTIAL/PLATELET
Basophils Absolute: 0.1 10*3/uL (ref 0.0–0.1)
Basophils Relative: 1 % (ref 0–1)
Eosinophils Absolute: 0.1 10*3/uL (ref 0.0–0.7)
Eosinophils Relative: 1 % (ref 0–5)
HCT: 45.8 % (ref 39.0–52.0)
Hemoglobin: 15.9 g/dL (ref 13.0–17.0)
Lymphocytes Relative: 39 % (ref 12–46)
Lymphs Abs: 2.5 10*3/uL (ref 0.7–4.0)
MCH: 29.7 pg (ref 26.0–34.0)
MCHC: 34.7 g/dL (ref 30.0–36.0)
MCV: 85.4 fL (ref 78.0–100.0)
Monocytes Absolute: 0.6 10*3/uL (ref 0.1–1.0)
Monocytes Relative: 9 % (ref 3–12)
Neutro Abs: 3.2 10*3/uL (ref 1.7–7.7)
Neutrophils Relative %: 50 % (ref 43–77)
Platelets: 234 10*3/uL (ref 150–400)
RBC: 5.36 MIL/uL (ref 4.22–5.81)
RDW: 12.6 % (ref 11.5–15.5)
WBC: 6.4 10*3/uL (ref 4.0–10.5)

## 2014-07-03 LAB — COMPREHENSIVE METABOLIC PANEL
ALT: 15 U/L (ref 0–53)
AST: 16 U/L (ref 0–37)
Albumin: 4.7 g/dL (ref 3.5–5.2)
Alkaline Phosphatase: 55 U/L (ref 39–117)
BUN: 11 mg/dL (ref 6–23)
CO2: 26 mEq/L (ref 19–32)
Calcium: 9.6 mg/dL (ref 8.4–10.5)
Chloride: 105 mEq/L (ref 96–112)
Creat: 1.22 mg/dL (ref 0.50–1.35)
Glucose, Bld: 93 mg/dL (ref 70–99)
Potassium: 3.9 mEq/L (ref 3.5–5.3)
Sodium: 138 mEq/L (ref 135–145)
Total Bilirubin: 0.6 mg/dL (ref 0.2–1.2)
Total Protein: 6.4 g/dL (ref 6.0–8.3)

## 2014-07-03 NOTE — Progress Notes (Signed)
   Subjective:    Patient ID: Parker Silva, male    DOB: January 24, 1956, 58 y.o.   MRN: [id number]  HPI He is here for consultation concerning continued difficulty with left shoulder pain. He was seen by Dr. Penni BombardKendall and apparently given an injection. I do not have that record to evaluate it. He states shot really give him very little relief. He is very frustrated over lack of good communication with that office in terms of getting pain medications. He did have an epidural injection for his cervical disease and states that the neck is doing much better. Review his record also indicates a significant weight loss. He has not had any abdominal pain, early satiety. He states his appetite is fairly good although he has been under stress recently due to the death of his mother.   Review of Systems     Objective:   Physical Exam Alert and in no distress otherwise not examined       Assessment & Plan:  Left shoulder pain  Loss of weight - Plan: CBC with Differential, Comprehensive metabolic panel I discussed extensively the left shoulder pain with him. I will get the information from Dr. Markus JarvisKendall's office. I will help expedite his care. I will also do routine blood screening on him concerning his weight loss and reevaluate later.

## 2014-07-04 ENCOUNTER — Telehealth: Payer: Self-pay | Admitting: Family Medicine

## 2014-07-04 NOTE — Telephone Encounter (Signed)
Pt stopped by and wanted lab results. Pt was informed that JCL not in today. Pt requested that labs be available as soon as possible. You may reach pt on cell phone.

## 2014-07-05 ENCOUNTER — Other Ambulatory Visit: Payer: Self-pay

## 2014-07-05 DIAGNOSIS — R634 Abnormal weight loss: Secondary | ICD-10-CM

## 2014-07-09 ENCOUNTER — Encounter: Payer: Self-pay | Admitting: Family Medicine

## 2014-09-19 ENCOUNTER — Encounter: Payer: Self-pay | Admitting: Gastroenterology

## 2014-09-20 ENCOUNTER — Telehealth: Payer: Self-pay | Admitting: Family Medicine

## 2014-09-20 NOTE — Telephone Encounter (Signed)
Requesting refill on Concerta 36 MG. Also pt wants to know what OTC meds he can take for sinus issues he started having last night. He has pressure behind eyes and across forehead, ears stopped up, nose stopped up. Call [phone] when scripts ready for pick up

## 2014-09-21 ENCOUNTER — Encounter: Payer: Self-pay | Admitting: Physician Assistant

## 2014-09-21 ENCOUNTER — Ambulatory Visit (INDEPENDENT_AMBULATORY_CARE_PROVIDER_SITE_OTHER): Payer: BLUE CROSS/BLUE SHIELD | Admitting: Physician Assistant

## 2014-09-21 VITALS — BP 110/76 | HR 88 | Ht 71.5 in | Wt 172.1 lb

## 2014-09-21 DIAGNOSIS — K648 Other hemorrhoids: Secondary | ICD-10-CM

## 2014-09-21 DIAGNOSIS — K644 Residual hemorrhoidal skin tags: Secondary | ICD-10-CM

## 2014-09-21 MED ORDER — HYDROCORTISONE ACETATE 25 MG RE SUPP: 25.0000 mg | Freq: Two times a day (BID) | RECTAL | Status: DC

## 2014-09-21 MED ORDER — METHYLPHENIDATE HCL ER (OSM) 36 MG PO TBCR: 36.0000 mg | EXTENDED_RELEASE_TABLET | ORAL | Status: DC

## 2014-09-21 MED ORDER — METHYLPHENIDATE HCL ER (OSM) 36 MG PO TBCR: 36.0000 mg | EXTENDED_RELEASE_TABLET | Freq: Every day | ORAL | Status: DC

## 2014-09-21 NOTE — Progress Notes (Signed)
Patient ID: TIMOFEY Silva, male   DOB: 1955/12/23, 59 y.o.   MRN: [id number]    HPI:   Mall is a 59 year old male referred for evaluation by Dr. Susann Givens due to rectal bleeding as well as needed for surveillance colonoscopy.  Parker Silva had a colonoscopy in June 2006 with Dr. Arlyce Dice. She says it was normal and he was had advised to have surveillance in 10 years. He would like to wait until June to schedule this.  Parker Silva is here today because over the past several months he has had bright red blood per rectum with just about every bowel movement. He was evaluated by his PCP and told he may have a fissure. He was given a trial of Analpram cream but continues to have bleeding. He denies the sensation of passing glass or razors when he moves his bowels. He has a formed bowel movement on a daily basis and doesn't strain. He reports that he has an occasional soreness near the rectum. He frequently has a sensation of incomplete evacuation and has to wipe copiously after move minutes to be clean. He has no abdominal pain. He has had no loss of appetite or unexplained weight loss. There is no known family history of colon cancer, colon polyps, or inflammatory bowel disease.  Past Medical History  Diagnosis Date  . Allergy     RHINITIS  . ADHD (attention deficit hyperactivity disorder)   . Dyslexia   . History of migraine headaches   . Tension headache   . Sleep disturbance   . ED (erectile dysfunction)   . Anal fissure     Past Surgical History  Procedure Laterality Date  . Appendectomy    . Tonsillectomy     Family History  Problem Relation Age of Onset  . Breast cancer Mother    History  Substance Use Topics  . Smoking status: Never Smoker   . Smokeless tobacco: Never Used  . Alcohol Use: Yes     Comment: rare   Current Outpatient Prescriptions  Medication Sig Dispense Refill  . buPROPion (WELLBUTRIN SR) 150 MG 12 hr tablet TAKE 1 TABLET ONCE DAILY. 90 tablet 1  . fexofenadine  (ALLEGRA) 180 MG tablet Take 180 mg by mouth daily.      . fluticasone (FLONASE) 50 MCG/ACT nasal spray USE 2 SPRAYS IN EACH NOSTRIL ONCE DAILY 16 g 11  . Multiple Vitamins-Minerals (MULTIVITAMIN WITH MINERALS) tablet Take 1 tablet by mouth daily.    . rizatriptan (MAXALT) 10 MG tablet Take 10 mg by mouth as needed. May repeat in 2 hours if needed     . sildenafil (VIAGRA) 100 MG tablet TAKE (1) TABLET AS NEEDED. 4 tablet 11  . topiramate (TOPAMAX) 25 MG capsule Take 75 mg by mouth daily.     . traZODone (DESYREL) 50 MG tablet Take 200 mg by mouth at bedtime.    . hydrocortisone (ANUSOL-HC) 25 MG suppository Place 1 suppository (25 mg total) rectally 2 (two) times daily. 20 suppository 1  . [START ON 09/25/2014] methylphenidate (CONCERTA) 36 MG PO CR tablet Take 1 tablet (36 mg total) by mouth every morning. 31 tablet 0  . [START ON 10/24/2014] methylphenidate (CONCERTA) 36 MG PO CR tablet Take 1 tablet (36 mg total) by mouth daily. 31 tablet 0  . [START ON 11/24/2014] methylphenidate (CONCERTA) 36 MG PO CR tablet Take 1 tablet (36 mg total) by mouth daily. 31 tablet 0   No current facility-administered medications for this visit.   No  Known Allergies   Review of Systems: Gen: Denies any fever, chills, sweats, anorexia, fatigue, weakness, malaise, weight loss, and sleep disorder CV: Denies chest pain, angina, palpitations, syncope, orthopnea, PND, peripheral edema, and claudication. Resp: Denies dyspnea at rest, dyspnea with exercise, cough, sputum, wheezing, coughing up blood, and pleurisy. GI: Denies vomiting blood, jaundice, and fecal incontinence.   Denies dysphagia or odynophagia. GU : Denies urinary burning, blood in urine, urinary frequency, urinary hesitancy, nocturnal urination, and urinary incontinence. MS: Denies joint pain, limitation of movement, and swelling, stiffness, low back pain, extremity pain. Denies muscle weakness, cramps, atrophy.  Derm: Denies rash, itching, dry skin,  hives, moles, warts, or unhealing ulcers.  Psych: Denies depression, anxiety, memory loss, suicidal ideation, hallucinations, paranoia, and confusion. Heme: Denies bruising, bleeding, and enlarged lymph nodes. Neuro:  Denies any headaches, dizziness, paresthesias. Endo:  Denies any problems with DM, thyroid, adrenal function   Prior Endoscopies:   Patient had a colonoscopy in June 2006 which was reportedly normal and he was advised to have surveillance in 10 years. Unfortunately, the report is not available at this time.  Physical Exam: BP 110/76 mmHg  Pulse 88  Ht 5' 11.5" (1.816 m)  Wt 172 lb 2 oz (78.075 kg)  BMI 23.67 kg/m2 Constitutional: Pleasant,well-developed,male in no acute distress. HEENT: Normocephalic and atraumatic. Conjunctivae are normal. No scleral icterus. Neck supple.  Cardiovascular: Normal rate, regular rhythm.  Pulmonary/chest: Effort normal and breath sounds normal. No wheezing, rales or rhonchi. Abdominal: Soft, nondistended, nontender. Bowel sounds active throughout. There are no masses palpable. No hepatomegaly. Rectal: small external hemorrhoid, anoscopy not painful for pt. Internal hemorrhoid noted posteriorly. No fissure appreciated at this exam. Extremities: no edema Lymphadenopathy: No cervical adenopathy noted. Neurological: Alert and oriented to person place and time. Skin: Skin is warm and dry. No rashes noted. Psychiatric: Normal mood and affect. Behavior is normal.  ASSESSMENT AND PLAN: #1. Internal and external hemorrhoids. He's been advised to use Tucks wipes. He will be given a trial of Anusol HCC suppositories 1 per rectum twice daily for 10 days. He is to call us in 10-14 days to let us know if his bleeding is improving and if there has been improvement in his sensation of incomplete evacuation. He may be a candidate at some point for hemorrhoidal banding.  #2. Colorectal cancer screening. Patient is due for surveillance in June. He was offered  the option to be scheduled sooner but prefers to wait until June and will be put on the colonoscopy recall list for June 2016.    Berkley Cronkright, Tollie PizzaLori P PA-C 09/21/2014, 5:04 PM

## 2014-09-21 NOTE — Patient Instructions (Signed)
We have sent the following medications to your pharmacy for you to pick up at your convenience:  [facility] Suppositories  You may use Tucks wipes.  Call our office in 10-14 days and let us know if the bleeding is improving.  You are scheduled for a recall colonoscopy in June 2016.  You will receive a letter a month ahead of time reminding you to call to schedule it

## 2014-09-26 NOTE — Progress Notes (Signed)
Reviewed and agree with management.  He may be a candidate for band ligation of internal hemorrhoids.  I would defer this procedure until he has his colonoscopy. Barbette Hairobert D. Arlyce DiceKaplan, M.D., [facility]

## 2014-10-08 ENCOUNTER — Ambulatory Visit (INDEPENDENT_AMBULATORY_CARE_PROVIDER_SITE_OTHER): Payer: BLUE CROSS/BLUE SHIELD | Admitting: Family Medicine

## 2014-10-08 ENCOUNTER — Encounter: Payer: Self-pay | Admitting: Family Medicine

## 2014-10-08 VITALS — BP 116/74 | HR 89 | Wt 173.0 lb

## 2014-10-08 DIAGNOSIS — J019 Acute sinusitis, unspecified: Secondary | ICD-10-CM

## 2014-10-08 DIAGNOSIS — J301 Allergic rhinitis due to pollen: Secondary | ICD-10-CM

## 2014-10-08 DIAGNOSIS — L503 Dermatographic urticaria: Secondary | ICD-10-CM

## 2014-10-08 MED ORDER — AMOXICILLIN-POT CLAVULANATE 875-125 MG PO TABS: 1.0000 | ORAL_TABLET | Freq: Two times a day (BID) | ORAL | Status: DC

## 2014-10-08 NOTE — Patient Instructions (Signed)
Avoid taking long, hot and soapy showers. Use cortisone cream on the areas that itch.

## 2014-10-08 NOTE — Progress Notes (Signed)
   Subjective:    Patient ID: Parker Silva, male    DOB: 07-11-1956, 59 y.o.   MRN: [id number]  HPI He has a two-week history that started with slight sore throat followed by sneezing, itchy watery eyes, nasal congestion, PND. He does have underlying allergies. He did use a Netti pot as well as Benadryl with some relief. He now also has difficulty with redness and itching especially when he gets out of the shower in the morning. He has been using cortisone cream on this. He does not smoke.   Review of Systems     Objective:   Physical Exam Alert and in no distress. Tympanic membranes and canals are normal. Pharyngeal area is normal. Neck is supple without adenopathy or thyromegaly. Cardiac exam shows a regular sinus rhythm without murmurs or gallops. Lungs are clear to auscultation. Nasal mucosa is red with tenderness over both maxillary sinuses. Scan does demonstrate dermatographia.        Assessment & Plan:  Dermatographia  Acute sinusitis, recurrence not specified, unspecified location - Plan: amoxicillin-clavulanate (AUGMENTIN) 875-125 MG per tablet  Allergic rhinitis due to pollen  recommend avoiding long, hot and soapy showers and also using cortisone cream on the areas that itch. [place] and I discussed the use of sunscreens.

## 2014-10-22 ENCOUNTER — Other Ambulatory Visit: Payer: Self-pay | Admitting: Family Medicine

## 2014-10-22 ENCOUNTER — Telehealth: Payer: Self-pay | Admitting: Family Medicine

## 2014-10-22 MED ORDER — METHYLPHENIDATE HCL ER (OSM) 36 MG PO TBCR: 36.0000 mg | EXTENDED_RELEASE_TABLET | ORAL | Status: DC

## 2014-10-22 MED ORDER — METHYLPHENIDATE HCL ER (OSM) 36 MG PO TBCR: 36.0000 mg | EXTENDED_RELEASE_TABLET | Freq: Every day | ORAL | Status: DC

## 2014-10-22 NOTE — Telephone Encounter (Signed)
[facility] called and states she received the handwritten rx for Concerta for pt and it is dated for 10/24/14 and this needs to be 10/22/14 as last fill was 09/20/14 and the insurance only allows #30.  This would also change the date for the next one to be 11/20/14 #30.  Ok [facility] [employer] [facility].    #31 written but can't be given.

## 2014-11-20 ENCOUNTER — Other Ambulatory Visit: Payer: Self-pay | Admitting: Family Medicine

## 2014-11-26 ENCOUNTER — Other Ambulatory Visit: Payer: Self-pay | Admitting: Family Medicine

## 2014-11-27 NOTE — Telephone Encounter (Signed)
Is this Okay?

## 2014-12-01 ENCOUNTER — Other Ambulatory Visit: Payer: Self-pay | Admitting: Family Medicine

## 2014-12-01 MED ORDER — AZITHROMYCIN 500 MG PO TABS: 500.0000 mg | ORAL_TABLET | Freq: Every day | ORAL | Status: DC

## 2014-12-17 ENCOUNTER — Encounter: Payer: Self-pay | Admitting: Gastroenterology

## 2014-12-19 ENCOUNTER — Telehealth: Payer: Self-pay | Admitting: Family Medicine

## 2014-12-19 MED ORDER — METHYLPHENIDATE HCL ER (OSM) 36 MG PO TBCR: 36.0000 mg | EXTENDED_RELEASE_TABLET | ORAL | Status: DC

## 2014-12-19 MED ORDER — METHYLPHENIDATE HCL ER (OSM) 36 MG PO TBCR: 36.0000 mg | EXTENDED_RELEASE_TABLET | Freq: Every day | ORAL | Status: DC

## 2014-12-19 NOTE — Telephone Encounter (Signed)
Pt called for refills of concerta. He is out. Please call [phone] when ready.

## 2014-12-19 NOTE — Telephone Encounter (Signed)
Advised pt Rx Concerta x3 ready for pick up

## 2015-01-23 ENCOUNTER — Ambulatory Visit (AMBULATORY_SURGERY_CENTER): Payer: Self-pay | Admitting: *Deleted

## 2015-01-23 VITALS — Ht 72.0 in | Wt 169.2 lb

## 2015-01-23 DIAGNOSIS — Z1211 Encounter for screening for malignant neoplasm of colon: Secondary | ICD-10-CM

## 2015-01-23 MED ORDER — NA SULFATE-K SULFATE-MG SULF 17.5-3.13-1.6 GM/177ML PO SOLN: 1.0000 | Freq: Once | ORAL | Status: DC

## 2015-01-23 NOTE — Progress Notes (Signed)
No egg or soy allergy-soy did increase headaches at one point but no allergy  No home 02 use  No diet pills  No issues with past sedation  emmi video declined  Pt concerned about not eating for a day and a half.  Instructed on clears, no solids and he states he may call and RS until a morning procedure. We looked and first available was 8-1 at 0830 am . Pt aware and for now wishes to keep 6-14 as scheduled.

## 2015-01-30 ENCOUNTER — Encounter: Payer: Self-pay | Admitting: Gastroenterology

## 2015-02-01 ENCOUNTER — Ambulatory Visit (INDEPENDENT_AMBULATORY_CARE_PROVIDER_SITE_OTHER): Payer: BLUE CROSS/BLUE SHIELD | Admitting: Family Medicine

## 2015-02-01 VITALS — BP 100/70 | Wt 168.0 lb

## 2015-02-01 DIAGNOSIS — Z8669 Personal history of other diseases of the nervous system and sense organs: Secondary | ICD-10-CM | POA: Diagnosis not present

## 2015-02-01 DIAGNOSIS — R1012 Left upper quadrant pain: Secondary | ICD-10-CM | POA: Diagnosis not present

## 2015-02-01 NOTE — Progress Notes (Signed)
   Subjective:    Patient ID: Parker Silva, male    DOB: [birth year], 59 y.o.   MRN: [id number]  HPI He complains of a six-day history of anorexia, left upper quadrant pain and some slight nausea but no vomiting, diarrhea, black tarry stools. He is presently being treated for his migraine headaches and has been on an NSAID. He did state that food made the pain worse but when he would get a smoothie, the pain would be relieved. He has not tried milk or Maalox. He is scheduled for a routine colonoscopy next Tuesday.  Review of Systems     Objective:   Physical Exam Alert and in moderate distress with his headache. Cardiac exam shows regular rhythm without murmurs or gallops. Lungs are clear to auscultation. Abdominal exam shows decreased bowel sounds without masses and minimal discomfort in the left upper quadrant but no rebound.       Assessment & Plan:  Abdominal pain, left upper quadrant  History of migraine headaches The abdominal symptoms could possibly be NSAID induced and I have therefore asked him to hold the use of NSAIDs and switch to Tylenol as well as his triptan.He is to take 40 mg of Prilosec, maintain adequate hydration. Recommend smooth these since he can tolerate them. He is to call me Monday and if continued difficulty I will alert Dr. Arlyce Dice to possibly evaluate for NSAID induced ulcer.

## 2015-02-01 NOTE — Patient Instructions (Signed)
You can use Tylenol for pain relief but avoid the NSAID. Take 40 mg of Prilosec daily. Keep using the smoothies to keep yourself hydrated and get some calories. Eat whatever you comfortable eating

## 2015-02-04 ENCOUNTER — Telehealth: Payer: Self-pay | Admitting: Family Medicine

## 2015-02-04 NOTE — Telephone Encounter (Signed)
Pt was to call you and let you know if his stomach was better and it it not better.  Please add scope to colonoscopy for tomorrow with Dr Arlyce Dice

## 2015-02-05 ENCOUNTER — Encounter: Payer: Self-pay | Admitting: Gastroenterology

## 2015-02-05 ENCOUNTER — Ambulatory Visit (AMBULATORY_SURGERY_CENTER): Payer: BLUE CROSS/BLUE SHIELD | Admitting: Gastroenterology

## 2015-02-05 VITALS — BP 117/65 | HR 78 | Temp 97.7°F | Resp 16 | Ht 72.0 in | Wt 169.0 lb

## 2015-02-05 DIAGNOSIS — Z1211 Encounter for screening for malignant neoplasm of colon: Secondary | ICD-10-CM

## 2015-02-05 DIAGNOSIS — K921 Melena: Secondary | ICD-10-CM

## 2015-02-05 DIAGNOSIS — K648 Other hemorrhoids: Secondary | ICD-10-CM

## 2015-02-05 MED ORDER — SODIUM CHLORIDE 0.9 % IV SOLN: 500.0000 mL | INTRAVENOUS | Status: DC

## 2015-02-05 MED ORDER — PANTOPRAZOLE SODIUM 40 MG PO TBEC: 40.0000 mg | DELAYED_RELEASE_TABLET | Freq: Every day | ORAL | Status: DC

## 2015-02-05 MED ORDER — HYOSCYAMINE SULFATE ER 0.375 MG PO TBCR: EXTENDED_RELEASE_TABLET | ORAL | Status: DC

## 2015-02-05 NOTE — Progress Notes (Signed)
Report to PACU, RN, vss, BBS= Clear.  

## 2015-02-05 NOTE — Patient Instructions (Addendum)
Call office in one week to report progress regarding abdominal pain. Pick up new medications from your pharmacy.   Hemorrhoids seen today, handouts given on hemorrhoids and banding.  Repeat colonoscopy in 10 years.   YOU HAD AN ENDOSCOPIC PROCEDURE TODAY AT THE [facility]:   Refer to the procedure report that was given to you for any specific questions about what was found during the examination.  If the procedure report does not answer your questions, please call your gastroenterologist to clarify.  If you requested that your care partner not be given the details of your procedure findings, then the procedure report has been included in a sealed envelope for you to review at your convenience later.  YOU SHOULD EXPECT: Some feelings of bloating in the abdomen. Passage of more gas than usual.  Walking can help get rid of the air that was put into your GI tract during the procedure and reduce the bloating. If you had a lower endoscopy (such as a colonoscopy or flexible sigmoidoscopy) you may notice spotting of blood in your stool or on the toilet paper. If you underwent a bowel prep for your procedure, you may not have a normal bowel movement for a few days.  Please Note:  You might notice some irritation and congestion in your nose or some drainage.  This is from the oxygen used during your procedure.  There is no need for concern and it should clear up in a day or so.  SYMPTOMS TO REPORT IMMEDIATELY:   Following lower endoscopy (colonoscopy or flexible sigmoidoscopy):  Excessive amounts of blood in the stool  Significant tenderness or worsening of abdominal pains  Swelling of the abdomen that is new, acute  Fever of 100F or higher   For urgent or emergent issues, a gastroenterologist can be reached at any hour by calling [phone].   DIET: Your first meal following the procedure should be a small meal and then it is ok to progress to your normal diet. Heavy or fried foods  are harder to digest and may make you feel nauseous or bloated.  Likewise, meals heavy in dairy and vegetables can increase bloating.  Drink plenty of fluids but you should avoid alcoholic beverages for 24 hours.  ACTIVITY:  You should plan to take it easy for the rest of today and you should NOT DRIVE or use heavy machinery until tomorrow (because of the sedation medicines used during the test).    FOLLOW UP: Our staff will call the number listed on your records the next business day following your procedure to check on you and address any questions or concerns that you may have regarding the information given to you following your procedure. If we do not reach you, we will leave a message.  However, if you are feeling well and you are not experiencing any problems, there is no need to return our call.  We will assume that you have returned to your regular daily activities without incident.  If any biopsies were taken you will be contacted by phone or by letter within the next 1-3 weeks.  Please call us at [phone] if you have not heard about the biopsies in 3 weeks.    SIGNATURES/CONFIDENTIALITY: You and/or your care partner have signed paperwork which will be entered into your electronic medical record.  These signatures attest to the fact that that the information above on your After Visit Summary has been reviewed and is understood.  Full responsibility of  the confidentiality of this discharge information lies with you and/or your care-partner. 

## 2015-02-05 NOTE — Op Note (Signed)
[facility] [address] [place], [address]   COLONOSCOPY PROCEDURE REPORT  PATIENT: Parker Silva, Parker Silva  MR#: [id number] BIRTHDATE: Apr 30, 1956 , 58  yrs. old GENDER: male ENDOSCOPIST: Louis Meckel, MD REFERRED ML:[id number] Susann Givens, M.D. PROCEDURE DATE:  02/05/2015 PROCEDURE:   Colonoscopy, diagnostic First Screening Colonoscopy - Avg.  risk and is 50 yrs.  old or older - No.  Prior Negative Screening - Now for repeat screening. 10 or more years since last screening  History of Adenoma - Now for follow-up colonoscopy & has been > or = to 3 yrs.  N/A  Polyps removed today? No Recommend repeat exam, <10 yrs? No ASA CLASS:   Class II INDICATIONS:Colorectal Neoplasm Risk Assessment for this procedure is average risk and anal bleeding. MEDICATIONS: Monitored anesthesia care and Propofol 200 mg IV  DESCRIPTION OF PROCEDURE:   After the risks benefits and alternatives of the procedure were thoroughly explained, informed consent was obtained.  The digital rectal exam revealed no abnormalities of the rectum.   The LB Olympus Loaner [id number] endoscope was introduced through the anus and advanced to the ileum. No adverse events experienced.   The quality of the prep was (Suprep was used) excellent.  The instrument was then slowly withdrawn as the colon was fully examined. Estimated blood loss is zero unless otherwise noted in this procedure report.      COLON FINDINGS: Internal hemorrhoids were found.   The examination was otherwise normal.  Retroflexed views revealed no abnormalities. The time to cecum = 3.4 Withdrawal time = 6.4   The scope was withdrawn and the procedure completed. COMPLICATIONS: There were no immediate complications.  ENDOSCOPIC IMPRESSION: 1.   Internal hemorrhoids - Source for rectal bleeding 2.   The examination was otherwise normal  RECOMMENDATIONS: Band ligation of internal hemorrhoids repeat colonoscopy 10 years  eSigned:  Louis Meckel, MD 02/05/2015 4:10 PM   cc:   PATIENT NAME:  Yossi, Donze MR#: [id number]

## 2015-02-06 ENCOUNTER — Telehealth: Payer: Self-pay | Admitting: *Deleted

## 2015-02-06 NOTE — Telephone Encounter (Signed)
Please find a time that he can be worked in for an office visit sometime in the next 1-2 weeks for hemorrhoidal banding

## 2015-02-06 NOTE — Telephone Encounter (Signed)
  Follow up Call-  Call back number 02/05/2015  Post procedure Call Back phone  # [phone]  Permission to leave phone message Yes     Patient questions:  Do you have a fever, pain , or abdominal swelling? No. Pain Score  0 *  Have you tolerated food without any problems? Yes.    Have you been able to return to your normal activities? Yes.    Do you have any questions about your discharge instructions: Diet   No. Medications  No. Follow up visit  No.  Do you have questions or concerns about your Care? Yes.    Pt. States that he is very "disappointed" that he was not able to have his hemorrhoid banding with colonoscopy.  He states that he was told prior to colonoscopy that if he did Have the hemorrhoids, they could be removed during the colonoscopy procedure.  I told him that the banding was a separate procedure in office. He was advised that he could not be seen until August.  He is bleeding and is not happy About waiting until August.  Is there any way that he could be worked in before that date?  Actions: * If pain score is 4 or above: No action needed, pain <4.

## 2015-02-06 NOTE — Telephone Encounter (Signed)
appt 03/08/15

## 2015-02-07 ENCOUNTER — Encounter: Payer: Self-pay | Admitting: Gastroenterology

## 2015-02-07 ENCOUNTER — Telehealth: Payer: Self-pay | Admitting: Gastroenterology

## 2015-02-07 ENCOUNTER — Telehealth: Payer: Self-pay | Admitting: Family Medicine

## 2015-02-07 NOTE — Telephone Encounter (Signed)
Schedule him to go to Dr. Loreta Ave or Dr. Elnoria Howard for follow-up on abdominal pain

## 2015-02-07 NOTE — Telephone Encounter (Signed)
Pt called and wants you to call him.  They did not do the scope.  Pt ph [phone]

## 2015-02-07 NOTE — Telephone Encounter (Signed)
Patient in office. Discussed options for appointment for banding. Per his request I called his office. He had an assistant speak with me and check his schedule and availability. An appointment is scheduled for 03/04/15 for the first banding. He is interested in changing doctors within the practice. Discussed the policy. He does want to go forward with this, but keep the appointment for the banding.

## 2015-02-11 ENCOUNTER — Telehealth: Payer: Self-pay

## 2015-02-11 NOTE — Telephone Encounter (Signed)
Spoke with the patient. Offered him an appointment on 02/13/15 at 11:15 am. He agrees to this appointment.

## 2015-02-12 NOTE — Telephone Encounter (Signed)
I have talked with Dr.Mann and Dr.Hung office they want records sent first called pt to inform him

## 2015-02-12 NOTE — Telephone Encounter (Signed)
Patient was informed Their office would need his records asked if he could sign a release there and have them send them to [facility] office . He said he has appointment tomorrow to have scope done that if it didn't go well he would have records sent

## 2015-02-13 ENCOUNTER — Ambulatory Visit (INDEPENDENT_AMBULATORY_CARE_PROVIDER_SITE_OTHER): Payer: BLUE CROSS/BLUE SHIELD | Admitting: Gastroenterology

## 2015-02-13 ENCOUNTER — Encounter: Payer: Self-pay | Admitting: Gastroenterology

## 2015-02-13 ENCOUNTER — Telehealth: Payer: Self-pay | Admitting: Family Medicine

## 2015-02-13 ENCOUNTER — Telehealth: Payer: Self-pay | Admitting: *Deleted

## 2015-02-13 VITALS — BP 106/70 | HR 88 | Ht 72.0 in | Wt 169.6 lb

## 2015-02-13 DIAGNOSIS — K648 Other hemorrhoids: Secondary | ICD-10-CM | POA: Diagnosis not present

## 2015-02-13 DIAGNOSIS — G8929 Other chronic pain: Secondary | ICD-10-CM

## 2015-02-13 DIAGNOSIS — R1013 Epigastric pain: Secondary | ICD-10-CM

## 2015-02-13 HISTORY — DX: Other hemorrhoids: K64.8

## 2015-02-13 NOTE — Patient Instructions (Signed)
HEMORRHOID BANDING PROCEDURE    FOLLOW-UP CARE   1. The procedure you have had should have been relatively painless since the banding of the area involved does not have nerve endings and there is no pain sensation.  The rubber band cuts off the blood supply to the hemorrhoid and the band may fall off as soon as 48 hours after the banding (the band may occasionally be seen in the toilet bowl following a bowel movement). You may notice a temporary feeling of fullness in the rectum which should respond adequately to plain Tylenol or Motrin.  2. Following the banding, avoid strenuous exercise that evening and resume full activity the next day.  A sitz bath (soaking in a warm tub) or bidet is soothing, and can be useful for cleansing the area after bowel movements.     3. To avoid constipation, take two tablespoons of natural wheat bran, natural oat bran, flax, Benefiber or any over the counter fiber supplement and increase your water intake to 7-8 glasses daily.    4. Unless you have been prescribed anorectal medication, do not put anything inside your rectum for two weeks: No suppositories, enemas, fingers, etc.  5. Occasionally, you may have more bleeding than usual after the banding procedure.  This is often from the untreated hemorrhoids rather than the treated one.  Don't be concerned if there is a tablespoon or so of blood.  If there is more blood than this, lie flat with your bottom higher than your head and apply an ice pack to the area. If the bleeding does not stop within a half an hour or if you feel faint, call our office at [phone] or go to the emergency room.  6. Problems are not common; however, if there is a substantial amount of bleeding, severe pain, chills, fever or difficulty passing urine (very rare) or other problems, you should call us at [phone] or report to the nearest emergency room.  7. Do not stay seated continuously for more than 2-3 hours for a day or two  after the procedure.  Tighten your buttock muscles 10-15 times every two hours and take 10-15 deep breaths every 1-2 hours.  Do not spend more than a few minutes on the toilet if you cannot empty your bowel; instead re-visit the toilet at a later time.  Your 2nd banding is scheduled on 04/15/2015 at 8:30am  Hold NSAIDS if no better in 2 weeks call the office back

## 2015-02-13 NOTE — Telephone Encounter (Signed)
Called patient to inform 2nd banding will be on 7-11  Instead of that appt being a follow up we will just do 2nd banding at that time  3rd banding on 8/22 with patient on Wait list    Patient aware of all appointments

## 2015-02-13 NOTE — Assessment & Plan Note (Signed)
Abdominal pain very likely is medication-related.  Patient has been taking NSAIDs or Excedrin for migraine headaches.  He may have active ulcer.  He was empirically placed on Protonix and instructed to use hyomax as needed.  Recommendations #1 hold NSAIDs and ASA products; if not improved in the next 2 weeks would proceed with upper endoscopy #2 to consider therapy with Cytotec for his headaches if he requires NSAIDs for headache control.  Should this be decided then I would proceed with upper endoscopy to make sure that he does not have active ulcer before resuming NSAIDs  CC Dr. Lalonde    

## 2015-02-13 NOTE — Progress Notes (Signed)
_                                                                                                                History of Present Illness:  Mr. Parker Silva is a 59 year old white male referred at the request of Dr. Susann Givens for evaluation of abdominal pain and rectal bleeding.  He's had burning and fairly constant left upper quadrant pain for at least 2 months.  He has been taking NSAIDs regularly for headaches.  He recently switched from NSAID to Excedrin.  Symptoms are worse postprandially and a copy by bloating.  He occasionally has pyrosis.  Symptoms have slightly improved with Protonix.  He denies nausea or dysphagia.  He also complains of fairly constant rectal bleeding with a bowel movement.  There is blood on the stools and in the water.  Colonoscopy last week demonstrated internal hemorrhoids.  He is without rectal pain.   Past Medical History  Diagnosis Date  . ADHD (attention deficit hyperactivity disorder)   . Dyslexia   . History of migraine headaches   . Tension headache   . Sleep disturbance   . ED (erectile dysfunction)   . Anal fissure   . Hemorrhoids   . Allergy     RHINITIS-seasonal  . Arthritis     back,neck   Past Surgical History  Procedure Laterality Date  . Appendectomy    . Tonsillectomy    . Colonoscopy  2006   family history includes Breast cancer in his mother. There is no history of Colon cancer, Rectal cancer, or Stomach cancer. Current Outpatient Prescriptions  Medication Sig Dispense Refill  . buPROPion (WELLBUTRIN SR) 150 MG 12 hr tablet TAKE 1 TABLET ONCE DAILY. 90 tablet 0  . fexofenadine (ALLEGRA) 180 MG tablet Take 180 mg by mouth daily.      . fluticasone (FLONASE) 50 MCG/ACT nasal spray USE 2 SPRAYS IN EACH NOSTRIL ONCE DAILY 16 g 11  . Hyoscyamine Sulfate 0.375 MG TBCR Take one tab twice a day for 5 days, then as needed, abdominal pain 25 tablet 1  . methylphenidate (CONCERTA) 36 MG PO CR tablet Take 1 tablet (36 mg total) by  mouth daily. 30 tablet 0  . Multiple Vitamins-Minerals (MULTIVITAMIN WITH MINERALS) tablet Take 1 tablet by mouth daily.    . pantoprazole (PROTONIX) 40 MG tablet Take 1 tablet (40 mg total) by mouth daily. 90 tablet 3  . rizatriptan (MAXALT) 10 MG tablet Take 10 mg by mouth as needed (not had in 1 year as of 01-23-2015). May repeat in 2 hours if needed    . topiramate (TOPAMAX) 25 MG capsule Take 75 mg by mouth daily.     . traZODone (DESYREL) 50 MG tablet Take 250 mg by mouth at bedtime.     . flurbiprofen (ANSAID) 50 MG tablet Take 50 mg by mouth as needed.     No current facility-administered medications for this visit.   Allergies as of 02/13/2015  . (No Known Allergies)    reports  that he has never smoked. He has never used smokeless tobacco. He reports that he drinks alcohol. He reports that he does not use illicit drugs.   Review of Systems: Pertinent positive and negative review of systems were noted in the above HPI section. All other review of systems were otherwise negative.  Vital signs were reviewed in today's medical record Physical Exam: General: Well developed , well nourished, no acute distress Skin: anicteric Head: Normocephalic and atraumatic Eyes:  sclerae anicteric, EOMI Ears: Normal auditory acuity Mouth: No deformity or lesions Neck: Supple, no masses or thyromegaly Lymph Nodes: no lymphadenopathy Lungs: Clear throughout to auscultation Heart: Regular rate and rhythm; no murmurs, rubs or bruits Gastroinestinal: Soft, non tender and non distended. No masses, hepatosplenomegaly or hernias noted. Normal Bowel sounds Rectal:deferred Musculoskeletal: Symmetrical with no gross deformities  Skin: No lesions on visible extremities Pulses:  Normal pulses noted Extremities: No clubbing, cyanosis, edema or deformities noted Neurological: Alert oriented x 4, grossly nonfocal Cervical Nodes:  No significant cervical adenopathy Inguinal Nodes: No significant inguinal  adenopathy Psychological:  Alert and cooperative. Normal mood and affect  PROCEDURE NOTE: The patient presents with symptomatic grade *2**  hemorrhoids, requesting rubber band ligation of his/her hemorrhoidal disease.  All risks, benefits and alternative forms of therapy were described and informed consent was obtained.   The anorectum was pre-medicated with lubricant and nitroglycerine ointment The decision was made to band the *right posterior** internal hemorrhoid, and the [facility] O'Regan System was used to perform band ligation without complication.  Digital anorectal examination was then performed to assure proper positioning of the band, and to adjust the banded tissue as required.  The patient was discharged home without pain or other issues.  Dietary and behavioral recommendations were given and along with follow-up instructions.    The patient will return in *4** weeks for  follow-up and possible additional banding as required. No complications were encountered and the patient tolerated the procedure well.

## 2015-02-13 NOTE — Telephone Encounter (Signed)
Pt asked that Cheri call him when she retuns tomorrow. He says he was to talk to [facility] about something

## 2015-02-14 NOTE — Telephone Encounter (Signed)
I have called patient and Dr.kaplen has taken care of all issues

## 2015-02-19 ENCOUNTER — Other Ambulatory Visit: Payer: Self-pay | Admitting: Family Medicine

## 2015-02-19 NOTE — Telephone Encounter (Signed)
Is this okay?

## 2015-03-04 ENCOUNTER — Ambulatory Visit (INDEPENDENT_AMBULATORY_CARE_PROVIDER_SITE_OTHER): Payer: BLUE CROSS/BLUE SHIELD | Admitting: Gastroenterology

## 2015-03-04 ENCOUNTER — Encounter: Payer: Self-pay | Admitting: Gastroenterology

## 2015-03-04 VITALS — BP 110/70 | HR 76 | Ht 71.5 in | Wt 166.2 lb

## 2015-03-04 DIAGNOSIS — R1013 Epigastric pain: Principal | ICD-10-CM

## 2015-03-04 DIAGNOSIS — K641 Second degree hemorrhoids: Secondary | ICD-10-CM | POA: Diagnosis not present

## 2015-03-04 DIAGNOSIS — K648 Other hemorrhoids: Secondary | ICD-10-CM

## 2015-03-04 DIAGNOSIS — G8929 Other chronic pain: Secondary | ICD-10-CM

## 2015-03-04 NOTE — Progress Notes (Signed)
PROCEDURE NOTE: The patient presents with symptomatic grade **2*  hemorrhoids, requesting rubber band ligation of his/her hemorrhoidal disease.  All risks, benefits and alternative forms of therapy were described and informed consent was obtained.   The anorectum was pre-medicated with lubricant and nitroglycerine ointment The decision was made to band the *right anterior** internal hemorrhoid, and the [facility] O'Regan System was used to perform band ligation without complication.  Digital anorectal examination was then performed to assure proper positioning of the band, and to adjust the banded tissue as required.  The patient was discharged home without pain or other issues.  Dietary and behavioral recommendations were given and along with follow-up instructions.    The patient will return in *4** weeks for  follow-up and possible additional banding as required. No complications were encountered and the patient tolerated the procedure well.  2

## 2015-03-04 NOTE — Assessment & Plan Note (Signed)
Abdominal pain very likely is medication-related.  Patient has been taking NSAIDs or Excedrin for migraine headaches.  He may have active ulcer.  He was empirically placed on Protonix and instructed to use hyomax as needed.  Recommendations #1 hold NSAIDs and ASA products; if not improved in the next 2 weeks would proceed with upper endoscopy #2 to consider therapy with Cytotec for his headaches if he requires NSAIDs for headache control.  Should this be decided then I would proceed with upper endoscopy to make sure that he does not have active ulcer before resuming NSAIDs  CC Dr. Susann GivensLalonde

## 2015-03-04 NOTE — Patient Instructions (Signed)
HEMORRHOID BANDING PROCEDURE    FOLLOW-UP CARE   1. The procedure you have had should have been relatively painless since the banding of the area involved does not have nerve endings and there is no pain sensation.  The rubber band cuts off the blood supply to the hemorrhoid and the band may fall off as soon as 48 hours after the banding (the band may occasionally be seen in the toilet bowl following a bowel movement). You may notice a temporary feeling of fullness in the rectum which should respond adequately to plain Tylenol or Motrin.  2. Following the banding, avoid strenuous exercise that evening and resume full activity the next day.  A sitz bath (soaking in a warm tub) or bidet is soothing, and can be useful for cleansing the area after bowel movements.     3. To avoid constipation, take two tablespoons of natural wheat bran, natural oat bran, flax, Benefiber or any over the counter fiber supplement and increase your water intake to 7-8 glasses daily.    4. Unless you have been prescribed anorectal medication, do not put anything inside your rectum for two weeks: No suppositories, enemas, fingers, etc.  5. Occasionally, you may have more bleeding than usual after the banding procedure.  This is often from the untreated hemorrhoids rather than the treated one.  Don't be concerned if there is a tablespoon or so of blood.  If there is more blood than this, lie flat with your bottom higher than your head and apply an ice pack to the area. If the bleeding does not stop within a half an hour or if you feel faint, call our office at [phone] or go to the emergency room.  6. Problems are not common; however, if there is a substantial amount of bleeding, severe pain, chills, fever or difficulty passing urine (very rare) or other problems, you should call us at [phone] or report to the nearest emergency room.  7. Do not stay seated continuously for more than 2-3 hours for a day or two  after the procedure.  Tighten your buttock muscles 10-15 times every two hours and take 10-15 deep breaths every 1-2 hours.  Do not spend more than a few minutes on the toilet if you cannot empty your bowel; instead re-visit the toilet at a later time.  Your 3rd banding appointment is scheduled on 04/15/2015 at 8:30am

## 2015-03-08 ENCOUNTER — Encounter: Payer: BLUE CROSS/BLUE SHIELD | Admitting: Gastroenterology

## 2015-03-19 ENCOUNTER — Telehealth: Payer: Self-pay | Admitting: Internal Medicine

## 2015-03-19 MED ORDER — METHYLPHENIDATE HCL ER (OSM) 36 MG PO TBCR: 36.0000 mg | EXTENDED_RELEASE_TABLET | Freq: Every day | ORAL | Status: DC

## 2015-03-19 NOTE — Telephone Encounter (Signed)
Pt called and needs a refill on his concerta . Call when ready @ [phone]

## 2015-04-15 ENCOUNTER — Ambulatory Visit (INDEPENDENT_AMBULATORY_CARE_PROVIDER_SITE_OTHER): Payer: BLUE CROSS/BLUE SHIELD | Admitting: Gastroenterology

## 2015-04-15 ENCOUNTER — Encounter: Payer: Self-pay | Admitting: Gastroenterology

## 2015-04-15 VITALS — BP 102/70 | HR 72 | Ht 71.5 in | Wt 166.0 lb

## 2015-04-15 DIAGNOSIS — K648 Other hemorrhoids: Secondary | ICD-10-CM

## 2015-04-15 NOTE — Progress Notes (Signed)
PROCEDURE NOTE: The patient presents with symptomatic grade *2**  hemorrhoids, requesting rubber band ligation of his/her hemorrhoidal disease.  All risks, benefits and alternative forms of therapy were described and informed consent was obtained.   The anorectum was pre-medicated with lubricant and nitroglycerine ointment The decision was made to band the *left lateral** internal hemorrhoid, and the [facility] O'Regan System was used to perform band ligation without complication.  Digital anorectal examination was then performed to assure proper positioning of the band, and to adjust the banded tissue as required.  The patient was discharged home without pain or other issues.  Dietary and behavioral recommendations were given and along with follow-up instructions.    The patient will return in **4* weeks for  follow-up and possible additional banding as required. No complications were encountered and the patient tolerated the procedure well.   

## 2015-04-15 NOTE — Patient Instructions (Signed)
HEMORRHOID BANDING PROCEDURE    FOLLOW-UP CARE   1. The procedure you have had should have been relatively painless since the banding of the area involved does not have nerve endings and there is no pain sensation.  The rubber band cuts off the blood supply to the hemorrhoid and the band may fall off as soon as 48 hours after the banding (the band may occasionally be seen in the toilet bowl following a bowel movement). You may notice a temporary feeling of fullness in the rectum which should respond adequately to plain Tylenol or Motrin.  2. Following the banding, avoid strenuous exercise that evening and resume full activity the next day.  A sitz bath (soaking in a warm tub) or bidet is soothing, and can be useful for cleansing the area after bowel movements.     3. To avoid constipation, take two tablespoons of natural wheat bran, natural oat bran, flax, Benefiber or any over the counter fiber supplement and increase your water intake to 7-8 glasses daily.    4. Unless you have been prescribed anorectal medication, do not put anything inside your rectum for two weeks: No suppositories, enemas, fingers, etc.  5. Occasionally, you may have more bleeding than usual after the banding procedure.  This is often from the untreated hemorrhoids rather than the treated one.  Don't be concerned if there is a tablespoon or so of blood.  If there is more blood than this, lie flat with your bottom higher than your head and apply an ice pack to the area. If the bleeding does not stop within a half an hour or if you feel faint, call our office at [phone] or go to the emergency room.  6. Problems are not common; however, if there is a substantial amount of bleeding, severe pain, chills, fever or difficulty passing urine (very rare) or other problems, you should call us at [phone] or report to the nearest emergency room.  7. Do not stay seated continuously for more than 2-3 hours for a day or two  after the procedure.  Tighten your buttock muscles 10-15 times every two hours and take 10-15 deep breaths every 1-2 hours.  Do not spend more than a few minutes on the toilet if you cannot empty your bowel; instead re-visit the toilet at a later time.   Your follow up appointment is scheduled on 06/14/2015 at 3:30pm

## 2015-05-07 ENCOUNTER — Encounter: Payer: BLUE CROSS/BLUE SHIELD | Admitting: Gastroenterology

## 2015-05-31 ENCOUNTER — Telehealth: Payer: Self-pay | Admitting: Internal Medicine

## 2015-05-31 NOTE — Telephone Encounter (Signed)
Yes, my pleasure

## 2015-06-05 NOTE — Telephone Encounter (Signed)
Spoke w/pt.  Will CB to sch'd w/Dr. Marina GoodellPerry

## 2015-06-05 NOTE — Telephone Encounter (Signed)
His appointment with Dr Arlyce DiceKaplan needs to be cancelled.

## 2015-06-07 ENCOUNTER — Telehealth: Payer: Self-pay | Admitting: Internal Medicine

## 2015-06-07 NOTE — Telephone Encounter (Signed)
Patient was being treated by Dr. Arlyce DiceKaplan and has switched to Dr. Marina GoodellPerry.  Please advise and routed this patient to appropriate provider.

## 2015-06-07 NOTE — Telephone Encounter (Signed)
Dr. Marina GoodellPerry does not do the banding. Dr. Leone PayorGessner and Dr. Terri PiedraPyrlte do the banding in the office.

## 2015-06-10 NOTE — Telephone Encounter (Signed)
Left message for patient to call back to discuss scheduling with Dr. Leone PayorGessner or Dr. Rhea BeltonPyrtle for banding

## 2015-06-14 ENCOUNTER — Ambulatory Visit: Payer: BLUE CROSS/BLUE SHIELD | Admitting: Gastroenterology

## 2015-06-17 ENCOUNTER — Telehealth: Payer: Self-pay | Admitting: Family Medicine

## 2015-06-17 MED ORDER — METHYLPHENIDATE HCL ER (OSM) 36 MG PO TBCR: 36.0000 mg | EXTENDED_RELEASE_TABLET | Freq: Every day | ORAL | Status: DC

## 2015-06-17 NOTE — Telephone Encounter (Signed)
Pt needs refill Concerta, please call when ready

## 2015-08-01 ENCOUNTER — Telehealth: Payer: Self-pay

## 2015-08-01 NOTE — Telephone Encounter (Signed)
-----   Message from Hilarie FredricksonJohn N Perry, MD sent at 07/31/2015  5:07 PM EST ----- Regarding: Establish with Dr. Marianna FussPerry Linda, I spoke with Mr. Clarene DukeMcmanus (who I know well) this afternoon. He wishes to have me provide his primary GI care ([facility] Please contact him and let him know that I have reviewed his chart and will happily do this for him. Also, he wishes to have hemorrhoidal banding. Schedule him with Dr. Rhea BeltonPyrtle or Leone PayorGessner at a time that works for Mr. Clarene DukeMcmanus. Otherwise, I will provide his continuity GI care. Covert to phone note for record. Thanks  Dr. Marina GoodellPerry

## 2015-08-01 NOTE — Telephone Encounter (Signed)
Pt aware. Pt scheduled to see Dr. Leone PayorGessner 09/06/15@3 :15pm for hem banding. Pt aware of appt.

## 2015-08-14 ENCOUNTER — Other Ambulatory Visit: Payer: Self-pay | Admitting: Family Medicine

## 2015-08-14 NOTE — Telephone Encounter (Signed)
Is this ok to refill?  

## 2015-08-15 NOTE — Telephone Encounter (Signed)
done

## 2015-09-03 ENCOUNTER — Encounter: Payer: Self-pay | Admitting: Family Medicine

## 2015-09-03 ENCOUNTER — Other Ambulatory Visit: Payer: Self-pay | Admitting: Family Medicine

## 2015-09-03 ENCOUNTER — Ambulatory Visit (INDEPENDENT_AMBULATORY_CARE_PROVIDER_SITE_OTHER): Payer: BLUE CROSS/BLUE SHIELD | Admitting: Family Medicine

## 2015-09-03 VITALS — BP 130/80 | HR 83 | Ht 71.5 in | Wt 178.0 lb

## 2015-09-03 DIAGNOSIS — I868 Varicose veins of other specified sites: Secondary | ICD-10-CM

## 2015-09-03 DIAGNOSIS — N341 Nonspecific urethritis: Secondary | ICD-10-CM | POA: Diagnosis not present

## 2015-09-03 DIAGNOSIS — R300 Dysuria: Secondary | ICD-10-CM

## 2015-09-03 DIAGNOSIS — K648 Other hemorrhoids: Secondary | ICD-10-CM

## 2015-09-03 DIAGNOSIS — Z202 Contact with and (suspected) exposure to infections with a predominantly sexual mode of transmission: Secondary | ICD-10-CM | POA: Diagnosis not present

## 2015-09-03 DIAGNOSIS — F4310 Post-traumatic stress disorder, unspecified: Secondary | ICD-10-CM | POA: Diagnosis not present

## 2015-09-03 DIAGNOSIS — I8390 Asymptomatic varicose veins of unspecified lower extremity: Secondary | ICD-10-CM

## 2015-09-03 LAB — POCT URINALYSIS DIPSTICK
Bilirubin, UA: NEGATIVE
Blood, UA: NEGATIVE
Glucose, UA: NEGATIVE
Ketones, UA: NEGATIVE
Leukocytes, UA: NEGATIVE
Nitrite, UA: NEGATIVE
Protein, UA: NEGATIVE
Spec Grav, UA: >=1.03
Urobilinogen, UA: NEGATIVE
pH, UA: 6

## 2015-09-03 NOTE — Progress Notes (Signed)
   Subjective:    Patient ID: Parker Silva, male    DOB: 22-Feb-1956, 60 y.o.   MRN: [id number]  HPI He complains of a Four-week history of dysuria no discharge, pain, urgency or frequency. He did have sex proximally one week prior to that with the same partner. Also has concerns over slightly painful varicose veins especially on the right. He has a history of internal hemorrhoids and has had difficulty getting them control. He has had banding in the past however it was not totally successful. He is scheduled for these to be taking care of within the next month or 2. He also has concerns over intermittent episodes of depression. He admits that this is an ongoing issue and he recognizes that some of this goes back to his childhood and some trauma that he had as a child especially being bullied by his older brother. He has been in counseling in the past but apparently has not specifically on that.   Review of Systems     Objective:   Physical Exam Alert and in no distress. Varicose veins noted on the medial distal right leg. They're slightly tender to palpation.       Assessment & Plan:  Dysuria - Plan: POCT urinalysis dipstick, RPR, HIV antibody, GC/chlamydia probe amp, urine  STD exposure - Plan: RPR, HIV antibody, GC/chlamydia probe amp, urine  Varicose veins  Internal hemorrhoids  Stress disorder, post traumatic I discussed the STD exposure with him. We will wait on the results before treatment. Explained involvement of the health department if the tests are positive. Discussed treatment of the varicose veins in regard to possibly having them sclerosed. Also recommended using support stockings. He will follow-up with his gastroenterologist concerning the internal hemorrhoids. I discussed the stress with him in terms of trauma from his childhood. Encouraged him to get involved in counseling. He does admit that it still affects him today interfering with social interaction with other  people.Over 25 mnutes, greater than 50% of this spent in counseling and coordination of care.

## 2015-09-03 NOTE — Patient Instructions (Signed)
Parker Silva [phone] 

## 2015-09-04 LAB — GC/CHLAMYDIA PROBE AMP
CT Probe RNA: NOT DETECTED
GC Probe RNA: NOT DETECTED

## 2015-09-04 LAB — RPR

## 2015-09-04 LAB — HIV ANTIBODY (ROUTINE TESTING W REFLEX): HIV 1&2 Ab, 4th Generation: NONREACTIVE

## 2015-09-04 MED ORDER — DOXYCYCLINE HYCLATE 100 MG PO TABS
100.0000 mg | ORAL_TABLET | Freq: Two times a day (BID) | ORAL | Status: DC
Start: 2015-09-04 — End: 2015-10-16

## 2015-09-04 NOTE — Progress Notes (Signed)
   Subjective:    Patient ID: Parker Silva, male    DOB: 07-08-1956, 60 y.o.   MRN: [id number]  HPI    Review of Systems     Objective:   Physical Exam        Assessment & Plan:  All his lab data came back negative. This points towards nongonococcal urethritis. I will treat him with doxycycline.

## 2015-09-04 NOTE — Addendum Note (Signed)
Addended by: Ronnald NianLALONDE, Tylie Golonka C on: 09/04/2015 09:06 AM   Modules accepted: Orders

## 2015-09-06 ENCOUNTER — Encounter: Payer: BLUE CROSS/BLUE SHIELD | Admitting: Internal Medicine

## 2015-09-17 ENCOUNTER — Telehealth: Payer: Self-pay

## 2015-09-17 MED ORDER — METHYLPHENIDATE HCL ER (OSM) 36 MG PO TBCR: 36.0000 mg | EXTENDED_RELEASE_TABLET | Freq: Every day | ORAL | Status: DC

## 2015-09-17 NOTE — Telephone Encounter (Signed)
Pt called needing more Concerta . He would like to be able to come pick it up today.

## 2015-10-16 ENCOUNTER — Encounter: Payer: Self-pay | Admitting: Internal Medicine

## 2015-10-16 ENCOUNTER — Ambulatory Visit (INDEPENDENT_AMBULATORY_CARE_PROVIDER_SITE_OTHER): Payer: Self-pay | Admitting: Internal Medicine

## 2015-10-16 VITALS — BP 100/70 | HR 80 | Ht 71.5 in | Wt 178.2 lb

## 2015-10-16 DIAGNOSIS — K648 Other hemorrhoids: Secondary | ICD-10-CM

## 2015-10-16 NOTE — Patient Instructions (Signed)
HEMORRHOID BANDING PROCEDURE    FOLLOW-UP CARE   1. The procedure you have had should have been relatively painless since the banding of the area involved does not have nerve endings and there is no pain sensation.  The rubber band cuts off the blood supply to the hemorrhoid and the band may fall off as soon as 48 hours after the banding (the band may occasionally be seen in the toilet bowl following a bowel movement). You may notice a temporary feeling of fullness in the rectum which should respond adequately to plain Tylenol or Motrin.  2. Following the banding, avoid strenuous exercise that evening and resume full activity the next day.  A sitz bath (soaking in a warm tub) or bidet is soothing, and can be useful for cleansing the area after bowel movements.     3. To avoid constipation, take two tablespoons of natural wheat bran, natural oat bran, flax, Benefiber or any over the counter fiber supplement and increase your water intake to 7-8 glasses daily.    4. Unless you have been prescribed anorectal medication, do not put anything inside your rectum for two weeks: No suppositories, enemas, fingers, etc.  5. Occasionally, you may have more bleeding than usual after the banding procedure.  This is often from the untreated hemorrhoids rather than the treated one.  Don't be concerned if there is a tablespoon or so of blood.  If there is more blood than this, lie flat with your bottom higher than your head and apply an ice pack to the area. If the bleeding does not stop within a half an hour or if you feel faint, call our office at [phone] or go to the emergency room.  6. Problems are not common; however, if there is a substantial amount of bleeding, severe pain, chills, fever or difficulty passing urine (very rare) or other problems, you should call us at [phone] or report to the nearest emergency room.  7. Do not stay seated continuously for more than 2-3 hours for a day or two  after the procedure.  Tighten your buttock muscles 10-15 times every two hours and take 10-15 deep breaths every 1-2 hours.  Do not spend more than a few minutes on the toilet if you cannot empty your bowel; instead re-visit the toilet at a later time.    Please MyChart message Dr Leone Payor in 4 weeks with an update on how your doing.    I appreciate the opportunity to care for you. Stan Head, MD, [facility]

## 2015-10-16 NOTE — Progress Notes (Signed)
  Patient is here for follow-up of hemorrhoids. He has had 3 banding's of each column last year by my previous partner Dr. Arlyce Dice. He states that his bleeding never stopped. Last time was in August and the band fell off within a day. He has bright red rectal bleeding on the toilet paper usually sometimes a fair amount. Colonoscopy was performed in 2016 and showed hemorrhoids. He says he does not have significant straining to stool.no significant rectal pain. Informed consent regarding anoscopy and banding is obtained.  Anoscopy is performed in the presence of Patti [place] CMAand after premedication with nitroglycerin and lidocaine topically your 0.125% and 5% respectively. He has grade 2 internal hemorrhoids in the RP and LL position and grade 1 right anterior  PROCEDURE NOTE: The patient presents with symptomatic grade 2  hemorrhoids, requesting rubber band ligation of his/her hemorrhoidal disease.  All risks, benefits and alternative forms of therapy were described and informed consent was obtained.  The decision was made to band the RP and LL internal hemorrhoids, and the [facility] was used to perform band ligation without complication.  Digital anorectal examination was then performed to assure proper positioning of the band, and to adjust the banded tissue as required.  The patient was discharged home without pain or other issues.  Dietary and behavioral recommendations were given and along with follow-up instructions.      The patient will contact me in 1 month to update of his symptoms and we'll determine need for  follow-up and possible additional banding as required. No complications were encountered and the patient tolerated the procedure well.

## 2015-10-16 NOTE — Assessment & Plan Note (Signed)
RP and LL banded He will call or My Chart message in 4 weeks with update

## 2015-12-16 ENCOUNTER — Telehealth: Payer: Self-pay | Admitting: Family Medicine

## 2015-12-16 MED ORDER — METHYLPHENIDATE HCL ER (OSM) 36 MG PO TBCR: 36.0000 mg | EXTENDED_RELEASE_TABLET | Freq: Every day | ORAL | Status: DC

## 2015-12-16 MED ORDER — METHYLPHENIDATE HCL ER (OSM) 36 MG PO TBCR
36.0000 mg | EXTENDED_RELEASE_TABLET | Freq: Every day | ORAL | Status: DC
Start: 2016-02-15 — End: 2016-03-04

## 2015-12-16 NOTE — Telephone Encounter (Signed)
Pt called for refills of Concerta. Please call pt at [phone]. Pt is completely out.

## 2016-01-13 DIAGNOSIS — I8391 Asymptomatic varicose veins of right lower extremity: Secondary | ICD-10-CM | POA: Diagnosis not present

## 2016-01-13 DIAGNOSIS — L578 Other skin changes due to chronic exposure to nonionizing radiation: Secondary | ICD-10-CM | POA: Diagnosis not present

## 2016-01-13 DIAGNOSIS — L821 Other seborrheic keratosis: Secondary | ICD-10-CM | POA: Diagnosis not present

## 2016-01-13 DIAGNOSIS — L570 Actinic keratosis: Secondary | ICD-10-CM | POA: Diagnosis not present

## 2016-02-13 ENCOUNTER — Telehealth: Payer: Self-pay

## 2016-02-13 ENCOUNTER — Other Ambulatory Visit: Payer: Self-pay | Admitting: Family Medicine

## 2016-02-13 NOTE — Telephone Encounter (Signed)
Is this okay to refill? 

## 2016-02-13 NOTE — Telephone Encounter (Signed)
Pt called asking for Concerta refills, offered an appt and denied.

## 2016-03-02 DIAGNOSIS — G43019 Migraine without aura, intractable, without status migrainosus: Secondary | ICD-10-CM | POA: Diagnosis not present

## 2016-03-02 DIAGNOSIS — G4700 Insomnia, unspecified: Secondary | ICD-10-CM | POA: Diagnosis not present

## 2016-03-02 DIAGNOSIS — G44219 Episodic tension-type headache, not intractable: Secondary | ICD-10-CM | POA: Diagnosis not present

## 2016-03-04 ENCOUNTER — Ambulatory Visit (INDEPENDENT_AMBULATORY_CARE_PROVIDER_SITE_OTHER): Payer: BLUE CROSS/BLUE SHIELD | Admitting: Family Medicine

## 2016-03-04 ENCOUNTER — Encounter: Payer: Self-pay | Admitting: Family Medicine

## 2016-03-04 VITALS — BP 110/70 | HR 80 | Ht 73.0 in | Wt 181.6 lb

## 2016-03-04 DIAGNOSIS — I868 Varicose veins of other specified sites: Secondary | ICD-10-CM

## 2016-03-04 DIAGNOSIS — M1990 Unspecified osteoarthritis, unspecified site: Secondary | ICD-10-CM | POA: Diagnosis not present

## 2016-03-04 DIAGNOSIS — Z1159 Encounter for screening for other viral diseases: Secondary | ICD-10-CM

## 2016-03-04 DIAGNOSIS — Z0000 Encounter for general adult medical examination without abnormal findings: Secondary | ICD-10-CM | POA: Diagnosis not present

## 2016-03-04 DIAGNOSIS — Z202 Contact with and (suspected) exposure to infections with a predominantly sexual mode of transmission: Secondary | ICD-10-CM | POA: Diagnosis not present

## 2016-03-04 DIAGNOSIS — J301 Allergic rhinitis due to pollen: Secondary | ICD-10-CM

## 2016-03-04 DIAGNOSIS — G43909 Migraine, unspecified, not intractable, without status migrainosus: Secondary | ICD-10-CM

## 2016-03-04 DIAGNOSIS — F341 Dysthymic disorder: Secondary | ICD-10-CM

## 2016-03-04 DIAGNOSIS — I8390 Asymptomatic varicose veins of unspecified lower extremity: Secondary | ICD-10-CM

## 2016-03-04 DIAGNOSIS — F909 Attention-deficit hyperactivity disorder, unspecified type: Secondary | ICD-10-CM | POA: Diagnosis not present

## 2016-03-04 DIAGNOSIS — G479 Sleep disorder, unspecified: Secondary | ICD-10-CM | POA: Diagnosis not present

## 2016-03-04 DIAGNOSIS — Z125 Encounter for screening for malignant neoplasm of prostate: Secondary | ICD-10-CM | POA: Diagnosis not present

## 2016-03-04 DIAGNOSIS — R5382 Chronic fatigue, unspecified: Secondary | ICD-10-CM

## 2016-03-04 LAB — CBC WITH DIFFERENTIAL/PLATELET
Basophils Absolute: 59 cells/uL (ref 0–200)
Basophils Relative: 1 %
Eosinophils Absolute: 59 cells/uL (ref 15–500)
Eosinophils Relative: 1 %
HCT: 46.5 % (ref 38.5–50.0)
Hemoglobin: 16.1 g/dL (ref 13.2–17.1)
Lymphocytes Relative: 33 %
Lymphs Abs: 1947 cells/uL (ref 850–3900)
MCH: 29.8 pg (ref 27.0–33.0)
MCHC: 34.6 g/dL (ref 32.0–36.0)
MCV: 86.1 fL (ref 80.0–100.0)
MPV: 10.6 fL (ref 7.5–12.5)
Monocytes Absolute: 649 cells/uL (ref 200–950)
Monocytes Relative: 11 %
Neutro Abs: 3186 cells/uL (ref 1500–7800)
Neutrophils Relative %: 54 %
Platelets: 201 10*3/uL (ref 140–400)
RBC: 5.4 MIL/uL (ref 4.20–5.80)
RDW: 12.7 % (ref 11.0–15.0)
WBC: 5.9 10*3/uL (ref 4.0–10.5)

## 2016-03-04 LAB — POCT URINALYSIS DIPSTICK
Bilirubin, UA: NEGATIVE
Blood, UA: NEGATIVE
Glucose, UA: NEGATIVE
Ketones, UA: NEGATIVE
Leukocytes, UA: NEGATIVE
Nitrite, UA: NEGATIVE
Protein, UA: NEGATIVE
Spec Grav, UA: 1.02
Urobilinogen, UA: NEGATIVE
pH, UA: 6

## 2016-03-04 LAB — LIPID PANEL
Cholesterol: 164 mg/dL (ref 125–200)
HDL: 37 mg/dL — ABNORMAL LOW (ref >=40)
LDL Cholesterol: 106 mg/dL (ref <130)
Total CHOL/HDL Ratio: 4.4 Ratio (ref <=5.0)
Triglycerides: 105 mg/dL (ref <150)
VLDL: 21 mg/dL (ref <30)

## 2016-03-04 LAB — COMPREHENSIVE METABOLIC PANEL
ALT: 21 U/L (ref 9–46)
AST: 20 U/L (ref 10–35)
Albumin: 4.8 g/dL (ref 3.6–5.1)
Alkaline Phosphatase: 53 U/L (ref 40–115)
BUN: 15 mg/dL (ref 7–25)
CO2: 26 mmol/L (ref 20–31)
Calcium: 9.2 mg/dL (ref 8.6–10.3)
Chloride: 103 mmol/L (ref 98–110)
Creat: 1.17 mg/dL (ref 0.70–1.33)
Glucose, Bld: 77 mg/dL (ref 65–99)
Potassium: 3.9 mmol/L (ref 3.5–5.3)
Sodium: 137 mmol/L (ref 135–146)
Total Bilirubin: 0.7 mg/dL (ref 0.2–1.2)
Total Protein: 6.6 g/dL (ref 6.1–8.1)

## 2016-03-04 MED ORDER — METHYLPHENIDATE HCL ER (OSM) 36 MG PO TBCR
36.0000 mg | EXTENDED_RELEASE_TABLET | Freq: Every day | ORAL | Status: DC
Start: 2016-04-16 — End: 2016-05-11

## 2016-03-04 MED ORDER — METHYLPHENIDATE HCL ER (OSM) 36 MG PO TBCR: 36.0000 mg | EXTENDED_RELEASE_TABLET | Freq: Every day | ORAL | Status: DC

## 2016-03-04 MED ORDER — BUPROPION HCL ER (SR) 150 MG PO TB12: 150.0000 mg | ORAL_TABLET | Freq: Every day | ORAL | Status: DC

## 2016-03-04 NOTE — Progress Notes (Signed)
Subjective:    Patient ID: Parker Silva, male    DOB: 05/03/1956, 60 y.o.   MRN: [id number]  HPI He is here for complete examination. He has a several month history of fatigue and decreased strength. He's had no fever, chills, cough or congestion. He does have a history of migraine headaches and is actively being treated for this. He is on several medications including Topamax and Maxalt as well deseryl for sleep. He is potentially interested in Botox. In the past he has also tried chiropractic as well as acupuncture to help with this. He also complains of neck pain that sometimes precipitates the headaches. He apparently has had x-rays in the past which did show x-ray evidence of arthritis.  He states he does wake up 2 or 3 times per night to go urinate. He does drink glass of water before he goes to bed.He does have ADD and continues to quite nicely on Concerta 36 mg. The medication lasts him roughly 10 hours. He does not have any withdrawal symptoms. He continues to do well on Wellbutrin which does help keep him relatively stable psychologically. He does stand a lot and does note occasional difficulty with symptomatic varicose veins bilaterally. He is intermittently sexually active and would like to be STD tested to be safe. Presently he is not involved in a long-term relationship. Family history significant for father having CABG in his 58s. Otherwise family and social history as well as health maintenance and immunizations were reviewed.  Review of Systems  All other systems reviewed and are negative.      Objective:   Physical Exam BP 110/70 mmHg  Pulse 80  Ht  (1.854 m)  Wt 181 lb 9.6 oz (82.373 kg)  BMI 23.96 kg/m2  General Appearance:    Alert, cooperative, no distress, appears stated age  Head:    Normocephalic, without obvious abnormality, atraumatic  Eyes:    PERRL, conjunctiva/corneas clear, EOM's intact, fundi    benign  Ears:    Normal TM's and external ear canals    Nose:   Nares normal, mucosa normal, no drainage or sinus   tenderness  Throat:   Lips, mucosa, and tongue normal; teeth and gums normal  Neck:   Supple, no lymphadenopathy;  thyroid:  no   enlargement/tenderness/nodules; no carotid   bruit or JVD  Back:    Spine nontender, no curvature, ROM normal, no CVA     tenderness  Lungs:     Clear to auscultation bilaterally without wheezes, rales or     ronchi; respirations unlabored      Heart:    Regular rate and rhythm, S1 and S2 normal, no murmur, rub   or gallop     Abdomen:     Soft, non-tender, nondistended, normoactive bowel sounds,    no masses, no hepatosplenomegaly  Genitalia:    Normal male external genitalia without lesions.  Testicles without masses.  No inguinal hernias.     Extremities:   No clubbing, cyanosis or edema  Pulses:   2+ and symmetric all extremities  Skin:   Skin color, texture, turgor normal, no rashes or lesions  Lymph nodes:   Cervical, supraclavicular, and axillary nodes normal  Neurologic:   CNII-XII intact, normal strength, sensation and gait; reflexes 2+ and symmetric throughout          Psych:   Normal mood, affect, hygiene and grooming.    EKG shows no acute changes      Assessment &  Plan:  Routine general medical examination at a health care facility - Plan: POCT Urinalysis Dipstick, EKG 12-Lead, CBC with Differential/Platelet, Comprehensive metabolic panel, Lipid panel  STD exposure - Plan: HIV antibody  Varicose veins  Migraine without status migrainosus, not intractable, unspecified migraine type  Sleep disturbance  Attention deficit hyperactivity disorder (ADHD), unspecified ADHD type - Plan: methylphenidate (CONCERTA) 36 MG PO CR tablet, methylphenidate (CONCERTA) 36 MG PO CR tablet, methylphenidate (CONCERTA) 36 MG PO CR tablet, DISCONTINUED: methylphenidate (CONCERTA) 36 MG PO CR tablet, DISCONTINUED: methylphenidate (CONCERTA) 36 MG PO CR tablet, DISCONTINUED: methylphenidate (CONCERTA) 36  MG PO CR tablet  Allergic rhinitis due to pollen  Arthritis  Dysthymia - Plan: buPROPion (WELLBUTRIN SR) 150 MG 12 hr tablet  Chronic fatigue - Plan: CBC with Differential/Platelet, Comprehensive metabolic panel, Testosterone, TSH  Screening for prostate cancer - Plan: PSA  Need for hepatitis C screening test - Plan: Hepatitis C antibody Encouraged him to cut out the fluid consumption and to be relatively dehydrated at night to see if that will help with his fatigue and nocturia. Information concerning Botox injections was also given. No therapy for the varicose veins. After the encounter he then texture me his concern about Lyme disease and I texture back saying that that was not high on my list.

## 2016-03-05 LAB — TESTOSTERONE: Testosterone: 594 ng/dL (ref 250–827)

## 2016-03-05 LAB — HIV ANTIBODY (ROUTINE TESTING W REFLEX): HIV 1&2 Ab, 4th Generation: NONREACTIVE

## 2016-03-05 LAB — HEPATITIS C ANTIBODY: HCV Ab: NEGATIVE

## 2016-03-05 LAB — TSH: TSH: 3.44 mIU/L (ref 0.40–4.50)

## 2016-03-05 LAB — PSA: PSA: 0.59 ng/mL (ref <=4.00)

## 2016-03-13 ENCOUNTER — Other Ambulatory Visit: Payer: Self-pay

## 2016-03-13 ENCOUNTER — Encounter: Payer: Self-pay | Admitting: Family Medicine

## 2016-03-13 DIAGNOSIS — R51 Headache: Principal | ICD-10-CM

## 2016-03-13 DIAGNOSIS — R519 Headache, unspecified: Secondary | ICD-10-CM

## 2016-03-23 ENCOUNTER — Encounter: Payer: BLUE CROSS/BLUE SHIELD | Admitting: Family Medicine

## 2016-05-01 ENCOUNTER — Telehealth: Payer: Self-pay | Admitting: Family Medicine

## 2016-05-01 MED ORDER — AMOXICILLIN-POT CLAVULANATE 875-125 MG PO TABS: 1.0000 | ORAL_TABLET | Freq: Two times a day (BID) | ORAL | 0 refills | Status: DC

## 2016-05-01 NOTE — Telephone Encounter (Signed)
Pt states he thinks he now has a sinus infection, states talked to you some about it at last physical and has tried OTC meds, Flonase, Allegra, Neti pot, etc & only gotten worse, face aches, forehead & eyes.  [facility]

## 2016-05-01 NOTE — Telephone Encounter (Signed)
He complains of sinus congestion and drainage unresponsive to OCT's. I will call in Augmentin.

## 2016-05-11 ENCOUNTER — Encounter: Payer: Self-pay | Admitting: Emergency Medicine

## 2016-05-11 ENCOUNTER — Encounter: Payer: Self-pay | Admitting: Neurology

## 2016-05-11 ENCOUNTER — Ambulatory Visit (INDEPENDENT_AMBULATORY_CARE_PROVIDER_SITE_OTHER): Payer: BLUE CROSS/BLUE SHIELD | Admitting: Neurology

## 2016-05-11 VITALS — BP 98/70 | HR 64 | Ht 72.0 in | Wt 178.0 lb

## 2016-05-11 DIAGNOSIS — R51 Headache: Secondary | ICD-10-CM | POA: Diagnosis not present

## 2016-05-11 DIAGNOSIS — M5030 Other cervical disc degeneration, unspecified cervical region: Secondary | ICD-10-CM

## 2016-05-11 DIAGNOSIS — G4486 Cervicogenic headache: Secondary | ICD-10-CM

## 2016-05-11 MED ORDER — TIZANIDINE HCL 2 MG PO TABS: 2.0000 mg | ORAL_TABLET | Freq: Three times a day (TID) | ORAL | 0 refills | Status: DC

## 2016-05-11 NOTE — Patient Instructions (Signed)
1.  I will prescribe you tizanidine 2mg , a muscle relaxer.  I prescribed it as 3 times a day as needed, but just take it once at bedtime for now.  Caution for drowsiness. 2.  We will refer you to Dr. Ethelene Halamos for injections in the neck (which I believe is the source of your headaches. 3.  Follow up in 3 months.

## 2016-05-11 NOTE — Progress Notes (Signed)
NEUROLOGY CONSULTATION NOTE  NAT LOWENTHAL MRN: [id number] DOB: 1956-04-17  Referring provider: Dr. Susann Givens Primary care provider: Dr. Susann Givens  Reason for consult:  Headache  HISTORY OF PRESENT ILLNESS: Parker Silva is a 60 year old right-handed man who presents for headache.  History obtained by patient and PCP note.  He has had headaches "as long as I can remember".   Traditional migraines are stabbing needle pain behind the left eye, which are much better controlled.  They occur every 8 months and respond to Maxalt.  He was taking topiramate 75mg  daily, which he decreased to 25mg  daily since headaches reduced in frequency.  However, he has another more frequent headache.  It is bifrontal as well as bilateral shoulder up the neck and in the occipital region.  It is about 8/10.  It is non-throbbing.  There is some photophobia.  No nausea.  It lasts 1 to 3 days and occurs about 8 to 10 days out of the month.  He reports constant neck and back pain.  Past NSAIDS:  Ibuprofen, ketoprofen, naproxen Past analgesics:  Tylenol Past abortive triptans:  no Past muscle relaxants:  no Past antihypertensive medications:  no Past antidepressant medications:  no Past anticonvulsant medications:  no  Current NSAIDS:  Flurbiprofen 50mg  (1 to 2 days per week) Current analgesics:  no Current triptans:  Maxalt 10mg  Current anti-emetic:  no Current muscle relaxants:  no Current anti-anxiolytic:  no Current sleep aide:  trazodone Current Antihypertensive medications:  no Current Antidepressant medications:  bupropion Current Anticonvulsant medications:  topiramate 75mg  Current Vitamins/Herbal/Supplements:  no Current Antihistamines/Decongestants:  Flonase Other therapy:  Acupunture, massage Other medication:  methylphenidate  Caffeine:  no Alcohol:  no Smoker:  no Diet:  hydrates Exercise:  Not routine Depression/stress:  stable Sleep hygiene:  good Family history of headache:   Mother had headaches.  MRI of brain with and without contrast and MRA of head from 10/03/10 (to evaluate headache) were personally revealed and were unremarkable.  He had an MRI of cervical spine on 04/28/14, which was personally reviewed and revealed multilevel spondylosis and degenerative disc disease causing impingement at C3-4, C4-5, C5-6, C6-7 and less prominent at C7-T1 and T2-T3.  He reportedly had an injection in the past which helped.  Labs from July:  CBC and CMP normal.  HIV negative.  PAST MEDICAL HISTORY: Past Medical History:  Diagnosis Date  . ADHD (attention deficit hyperactivity disorder)   . Allergy    RHINITIS-seasonal  . Anal fissure   . Arthritis    back,neck  . Dyslexia   . ED (erectile dysfunction)   . Hemorrhoids   . Hemorrhoids, internal, with bleeding 02/13/2015   Grade 2 hemorrhoids 02/13/15 band ligation right posterior hemorrhoidal bundle 03/04/15 band ligation right anterior hemorrhoidal bundle 04/15/15 band ligation left lateral hemorrhoidal bundle   . History of migraine headaches   . Sleep disturbance   . Tension headache     PAST SURGICAL HISTORY: Past Surgical History:  Procedure Laterality Date  . APPENDECTOMY    . COLONOSCOPY  2006  . HEMORRHOID BANDING    . TONSILLECTOMY      MEDICATIONS: Current Outpatient Prescriptions on File Prior to Visit  Medication Sig Dispense Refill  . buPROPion (WELLBUTRIN SR) 150 MG 12 hr tablet Take 1 tablet (150 mg total) by mouth daily. 90 tablet 3  . fexofenadine (ALLEGRA) 30 MG tablet Take 30 mg by mouth 2 (two) times daily.    . flurbiprofen (ANSAID) 50 MG  tablet Take 50 mg by mouth as needed.    . fluticasone (FLONASE) 50 MCG/ACT nasal spray USE 2 SPRAYS IN EACH NOSTRIL ONCE DAILY 16 g 11  . methylphenidate (CONCERTA) 36 MG PO CR tablet Take 1 tablet (36 mg total) by mouth daily. 31 tablet 0  . Multiple Vitamins-Minerals (MULTIVITAMIN WITH MINERALS) tablet Take 1 tablet by mouth daily. Reported on 03/04/2016     . rizatriptan (MAXALT) 10 MG tablet Take 10 mg by mouth as needed (not had in 1 year as of 01-23-2015). May repeat in 2 hours if needed    . topiramate (TOPAMAX) 25 MG capsule Take 25 mg by mouth daily.     . traZODone (DESYREL) 50 MG tablet Take 250 mg by mouth at bedtime.      No current facility-administered medications on file prior to visit.     ALLERGIES: No Known Allergies  FAMILY HISTORY: Family History  Problem Relation Age of Onset  . Breast cancer Mother   . Diabetes type II Father   . Heart failure Father   . COPD Father   . Colon cancer Neg Hx   . Rectal cancer Neg Hx   . Stomach cancer Neg Hx     SOCIAL HISTORY: Social History   Social History  . Marital status: Single    Spouse name: N/A  . Number of children: 0  . Years of education: N/A   Occupational History  . owns [employer]   Social History Main Topics  . Smoking status: Never Smoker  . Smokeless tobacco: Never Used  . Alcohol use Yes     Comment: no  . Drug use: No  . Sexual activity: Not Currently   Other Topics Concern  . Not on file   Social History Narrative   [place] alone [place] one [place] in [place].    REVIEW OF SYSTEMS: Constitutional: No fevers, chills, or sweats, no generalized fatigue, change in appetite Eyes: No visual changes, double vision, eye pain Ear, nose and throat: No hearing loss, ear pain, nasal congestion, sore throat Cardiovascular: No chest pain, palpitations Respiratory:  No shortness of breath at rest or with exertion, wheezes GastrointestinaI: No nausea, vomiting, diarrhea, abdominal pain, fecal incontinence Genitourinary:  No dysuria, urinary retention or frequency Musculoskeletal:  No neck pain, back pain Integumentary: No rash, pruritus, skin lesions Neurological: as above Psychiatric: No depression, insomnia, anxiety Endocrine: No palpitations, fatigue, diaphoresis, mood swings, change in appetite, change in  weight, increased thirst Hematologic/Lymphatic:  No purpura, petechiae. Allergic/Immunologic: no itchy/runny eyes, nasal congestion, recent allergic reactions, rashes  PHYSICAL EXAM: Vitals:   05/11/16 0947  BP: 98/70  Pulse: 64   General: No acute distress.  Patient appears well-groomed.  Head:  Normocephalic/atraumatic Eyes:  fundi examined but not visualized Neck: supple, no paraspinal tenderness, full range of motion Back: No paraspinal tenderness Heart: regular rate and rhythm Lungs: Clear to auscultation bilaterally. Vascular: No carotid bruits. Neurological Exam: Mental status: alert and oriented to person, place, and time, recent and remote memory intact, fund of knowledge intact, attention and concentration intact, speech fluent and not dysarthric, language intact. Cranial nerves: CN I: not tested CN II: pupils equal, round and reactive to light, visual fields intact CN III, IV, VI:  full range of motion, no nystagmus, no ptosis CN V: facial sensation intact CN VII: upper and lower face symmetric CN VIII: hearing intact CN IX, X: gag intact, uvula midline CN XI: sternocleidomastoid and trapezius  muscles intact CN XII: tongue midline Bulk & Tone: normal, no fasciculations. Motor:  5/5 throughout  Sensation: temperature and vibration sensation intact. Deep Tendon Reflexes:  2+ throughout, toes downgoing.  Finger to nose testing:  Without dysmetria.  Heel to shin:  Without dysmetria.  Gait:  Normal station and stride.  Able to turn and tandem walk. Romberg negative.  IMPRESSION: Cervicogenic headache Spondylosis and degenerative disc disease of cervical spine  PLAN: 1.  Will prescribe tizanidine 2mg  to take. 2.  Refer to Dr. Ethelene Hal, pain specialist, for possible epidural injections.  He saw him previously several years ago and responded well. 3.  Follow up in 3 months.  Thank you for allowing me to take part in the care of this patient.  Shon Millet, DO  CC:    Sharlot Gowda, MD

## 2016-05-11 NOTE — Progress Notes (Signed)
Referral sent to Dr. Ethelene Halamos office

## 2016-05-15 ENCOUNTER — Telehealth: Payer: Self-pay | Admitting: Neurology

## 2016-05-15 NOTE — Telephone Encounter (Signed)
Parker Silva 2056/07/24. He called needing to see if he had a referral sent to Dr. Ethelene Halamos. He was unsure if he needed to do it or our office? His # is [phone]. Thank you

## 2016-05-15 NOTE — Telephone Encounter (Signed)
Did review chart. Referral was placed on May 11, 2016. Spoke with patient. Gave him Dr. Ethelene Halamos' information [facility] - [address], [place], [place], [id number] [facility]: [phone] Fax: [phone])

## 2016-06-01 DIAGNOSIS — G44219 Episodic tension-type headache, not intractable: Secondary | ICD-10-CM | POA: Diagnosis not present

## 2016-06-01 DIAGNOSIS — G4700 Insomnia, unspecified: Secondary | ICD-10-CM | POA: Diagnosis not present

## 2016-06-01 DIAGNOSIS — M542 Cervicalgia: Secondary | ICD-10-CM | POA: Diagnosis not present

## 2016-06-01 DIAGNOSIS — G43009 Migraine without aura, not intractable, without status migrainosus: Secondary | ICD-10-CM | POA: Diagnosis not present

## 2016-06-02 DIAGNOSIS — M47812 Spondylosis without myelopathy or radiculopathy, cervical region: Secondary | ICD-10-CM | POA: Diagnosis not present

## 2016-06-17 DIAGNOSIS — L738 Other specified follicular disorders: Secondary | ICD-10-CM | POA: Diagnosis not present

## 2016-06-17 DIAGNOSIS — L821 Other seborrheic keratosis: Secondary | ICD-10-CM | POA: Diagnosis not present

## 2016-06-17 DIAGNOSIS — L718 Other rosacea: Secondary | ICD-10-CM | POA: Diagnosis not present

## 2016-06-17 DIAGNOSIS — L570 Actinic keratosis: Secondary | ICD-10-CM | POA: Diagnosis not present

## 2016-06-19 ENCOUNTER — Other Ambulatory Visit: Payer: Self-pay | Admitting: Medical

## 2016-06-19 ENCOUNTER — Telehealth: Payer: Self-pay | Admitting: Family Medicine

## 2016-06-19 DIAGNOSIS — F909 Attention-deficit hyperactivity disorder, unspecified type: Secondary | ICD-10-CM

## 2016-06-19 MED ORDER — METHYLPHENIDATE HCL ER (OSM) 36 MG PO TBCR: 36.0000 mg | EXTENDED_RELEASE_TABLET | Freq: Every day | ORAL | 0 refills | Status: DC

## 2016-06-19 NOTE — Telephone Encounter (Signed)
Pt called back and stated he was having headaches from not having his meds. He is requesting that he get this filled today by a different provider.

## 2016-06-19 NOTE — Telephone Encounter (Signed)
Pt informed

## 2016-06-19 NOTE — Telephone Encounter (Signed)
rx ready 

## 2016-06-19 NOTE — Telephone Encounter (Signed)
Pt called and is requesting a refill on his concerta informed pt that DR lalonde is out the office, pt states that he is out and needs his RX, and would like this today, pt can be reached at [phone] when ready to be picked up

## 2016-06-30 DIAGNOSIS — G4700 Insomnia, unspecified: Secondary | ICD-10-CM | POA: Diagnosis not present

## 2016-06-30 DIAGNOSIS — S0990XS Unspecified injury of head, sequela: Secondary | ICD-10-CM | POA: Diagnosis not present

## 2016-06-30 DIAGNOSIS — F329 Major depressive disorder, single episode, unspecified: Secondary | ICD-10-CM | POA: Diagnosis not present

## 2016-06-30 DIAGNOSIS — R5383 Other fatigue: Secondary | ICD-10-CM | POA: Diagnosis not present

## 2016-06-30 DIAGNOSIS — G44309 Post-traumatic headache, unspecified, not intractable: Secondary | ICD-10-CM | POA: Diagnosis not present

## 2016-06-30 DIAGNOSIS — E559 Vitamin D deficiency, unspecified: Secondary | ICD-10-CM | POA: Diagnosis not present

## 2016-07-21 ENCOUNTER — Telehealth: Payer: Self-pay | Admitting: Family Medicine

## 2016-07-21 DIAGNOSIS — F909 Attention-deficit hyperactivity disorder, unspecified type: Secondary | ICD-10-CM

## 2016-07-21 MED ORDER — METHYLPHENIDATE HCL ER (OSM) 36 MG PO TBCR: 36.0000 mg | EXTENDED_RELEASE_TABLET | Freq: Every day | ORAL | 0 refills | Status: DC

## 2016-07-21 NOTE — Telephone Encounter (Signed)
Pt called requesting a refill on his concerta pt can be reached at [phone] when ready to be picked up

## 2016-07-30 DIAGNOSIS — G4700 Insomnia, unspecified: Secondary | ICD-10-CM | POA: Diagnosis not present

## 2016-07-30 DIAGNOSIS — F329 Major depressive disorder, single episode, unspecified: Secondary | ICD-10-CM | POA: Diagnosis not present

## 2016-07-30 DIAGNOSIS — R5383 Other fatigue: Secondary | ICD-10-CM | POA: Diagnosis not present

## 2016-07-30 DIAGNOSIS — M549 Dorsalgia, unspecified: Secondary | ICD-10-CM | POA: Diagnosis not present

## 2016-08-28 ENCOUNTER — Ambulatory Visit: Payer: BLUE CROSS/BLUE SHIELD | Admitting: Neurology

## 2016-09-18 ENCOUNTER — Telehealth: Payer: Self-pay

## 2016-09-18 NOTE — Telephone Encounter (Signed)
[facility] called to let us know that pt is requesting refill today instead of 09/20/2016 (when script is written) Is this ok? Last filled 08/20/2016. [facility] can be reached at [phone]

## 2016-09-18 NOTE — Telephone Encounter (Signed)
Per Dr. Susann GivensLalonde- ok to refill on 09/18/2016.   Called [facility] to inform them. Trixie Rude/RLB

## 2016-10-13 DIAGNOSIS — E559 Vitamin D deficiency, unspecified: Secondary | ICD-10-CM | POA: Diagnosis not present

## 2016-10-13 DIAGNOSIS — R5383 Other fatigue: Secondary | ICD-10-CM | POA: Diagnosis not present

## 2016-10-13 DIAGNOSIS — E291 Testicular hypofunction: Secondary | ICD-10-CM | POA: Diagnosis not present

## 2016-10-13 DIAGNOSIS — F39 Unspecified mood [affective] disorder: Secondary | ICD-10-CM | POA: Diagnosis not present

## 2016-10-20 ENCOUNTER — Other Ambulatory Visit: Payer: Self-pay | Admitting: Family Medicine

## 2016-10-20 ENCOUNTER — Encounter: Payer: Self-pay | Admitting: Family Medicine

## 2016-10-20 ENCOUNTER — Ambulatory Visit (INDEPENDENT_AMBULATORY_CARE_PROVIDER_SITE_OTHER): Payer: BLUE CROSS/BLUE SHIELD | Admitting: Family Medicine

## 2016-10-20 VITALS — BP 124/80 | HR 105 | Temp 98.8°F | Wt 178.0 lb

## 2016-10-20 DIAGNOSIS — J0100 Acute maxillary sinusitis, unspecified: Secondary | ICD-10-CM | POA: Diagnosis not present

## 2016-10-20 DIAGNOSIS — F909 Attention-deficit hyperactivity disorder, unspecified type: Secondary | ICD-10-CM

## 2016-10-20 DIAGNOSIS — H6691 Otitis media, unspecified, right ear: Secondary | ICD-10-CM | POA: Diagnosis not present

## 2016-10-20 MED ORDER — METHYLPHENIDATE HCL ER (OSM) 36 MG PO TBCR
36.0000 mg | EXTENDED_RELEASE_TABLET | Freq: Every day | ORAL | 0 refills | Status: DC
Start: 2016-10-21 — End: 2017-01-21

## 2016-10-20 MED ORDER — METHYLPHENIDATE HCL ER (OSM) 36 MG PO TBCR: 36.0000 mg | EXTENDED_RELEASE_TABLET | Freq: Every day | ORAL | 0 refills | Status: DC

## 2016-10-20 MED ORDER — AMOXICILLIN-POT CLAVULANATE 875-125 MG PO TABS: 1.0000 | ORAL_TABLET | Freq: Two times a day (BID) | ORAL | 0 refills | Status: DC

## 2016-10-20 NOTE — Progress Notes (Signed)
   Subjective:    Patient ID: Parker Silva, male    DOB: 1956/05/01, 61 y.o.   MRN: [id number]  HPI 2 weeks ago he noted the onset of right ear pressure as well as some sinus congestion, PND. This continued until a proximally 3 days ago when he noted also slight sore throat, fever, chills, dry cough, fatigue with malaise. He does not smoke. Is no underlying allergies. He continues to use Concerta and gets roughly 12 hours of benefit out of it. His helps him stay focused. When it wears off he does note increased difficulty with focus.   Review of Systems     Objective:   Physical Exam Alert and in no distress. Tympanic on the right is dull and slightly vascular, left is normal, canals are normal. Pharyngeal area is normal. Neck is supple without adenopathy or thyromegaly. Cardiac exam shows a regular sinus rhythm without murmurs or gallops. Lungs are clear to auscultation. Nasal mucosa is slightly reddish with tenderness over maxillary sinuses.        Assessment & Plan:  Right otitis media, unspecified otitis media type - Plan: amoxicillin-clavulanate (AUGMENTIN) 875-125 MG tablet  Acute maxillary sinusitis, recurrence not specified - Plan: amoxicillin-clavulanate (AUGMENTIN) 875-125 MG tablet  Attention deficit hyperactivity disorder (ADHD), unspecified ADHD type His Concerta was renewed. He will call me if not entirely better when he finishes the Augmentin.

## 2016-10-27 ENCOUNTER — Other Ambulatory Visit: Payer: Self-pay | Admitting: Family Medicine

## 2016-10-27 DIAGNOSIS — H6691 Otitis media, unspecified, right ear: Secondary | ICD-10-CM

## 2016-10-27 DIAGNOSIS — J0100 Acute maxillary sinusitis, unspecified: Secondary | ICD-10-CM

## 2016-10-27 MED ORDER — AMOXICILLIN-POT CLAVULANATE 875-125 MG PO TABS: 1.0000 | ORAL_TABLET | Freq: Two times a day (BID) | ORAL | 0 refills | Status: DC

## 2016-11-03 DIAGNOSIS — G44309 Post-traumatic headache, unspecified, not intractable: Secondary | ICD-10-CM | POA: Diagnosis not present

## 2016-11-03 DIAGNOSIS — F329 Major depressive disorder, single episode, unspecified: Secondary | ICD-10-CM | POA: Diagnosis not present

## 2016-11-03 DIAGNOSIS — R5383 Other fatigue: Secondary | ICD-10-CM | POA: Diagnosis not present

## 2016-11-03 DIAGNOSIS — E291 Testicular hypofunction: Secondary | ICD-10-CM | POA: Diagnosis not present

## 2016-11-11 ENCOUNTER — Telehealth: Payer: Self-pay

## 2016-11-11 MED ORDER — LEVOFLOXACIN 500 MG PO TABS: 500.0000 mg | ORAL_TABLET | Freq: Every day | ORAL | 0 refills | Status: DC

## 2016-11-11 NOTE — Telephone Encounter (Signed)
He  Was given 2 rounds of Augmentin did get better but not entirely over it. His symptoms have reoccurred. I will switch him to Levaquin.

## 2016-11-11 NOTE — Telephone Encounter (Signed)
Pt states that he has seen you in and outside the office for his sinus' and that you have given him 2 rounds of ABX. He is calling in to let you know that sinus sx have returned. [facility] [phone]

## 2016-11-13 ENCOUNTER — Ambulatory Visit (INDEPENDENT_AMBULATORY_CARE_PROVIDER_SITE_OTHER): Payer: BLUE CROSS/BLUE SHIELD | Admitting: Family Medicine

## 2016-11-13 ENCOUNTER — Ambulatory Visit
Admission: RE | Admit: 2016-11-13 | Discharge: 2016-11-13 | Disposition: A | Discharge status: Home / Self Care | Payer: BLUE CROSS/BLUE SHIELD | Source: Ambulatory Visit | Attending: Family Medicine | Admitting: Family Medicine

## 2016-11-13 ENCOUNTER — Encounter: Payer: Self-pay | Admitting: Family Medicine

## 2016-11-13 VITALS — BP 120/80 | HR 93 | Temp 98.2°F | Resp 16 | Wt 169.9 lb

## 2016-11-13 DIAGNOSIS — R3915 Urgency of urination: Secondary | ICD-10-CM

## 2016-11-13 DIAGNOSIS — R0602 Shortness of breath: Secondary | ICD-10-CM | POA: Diagnosis not present

## 2016-11-13 DIAGNOSIS — R058 Other specified cough: Secondary | ICD-10-CM

## 2016-11-13 DIAGNOSIS — J309 Allergic rhinitis, unspecified: Secondary | ICD-10-CM | POA: Diagnosis not present

## 2016-11-13 DIAGNOSIS — R05 Cough: Secondary | ICD-10-CM | POA: Diagnosis not present

## 2016-11-13 DIAGNOSIS — K146 Glossodynia: Secondary | ICD-10-CM

## 2016-11-13 DIAGNOSIS — J3489 Other specified disorders of nose and nasal sinuses: Secondary | ICD-10-CM

## 2016-11-13 LAB — BASIC METABOLIC PANEL
BUN: 14 mg/dL (ref 7–25)
CO2: 28 mmol/L (ref 20–31)
Calcium: 9.2 mg/dL (ref 8.6–10.3)
Chloride: 99 mmol/L (ref 98–110)
Creat: 1.26 mg/dL (ref 0.70–1.33)
Glucose, Bld: 82 mg/dL (ref 65–99)
Potassium: 3.9 mmol/L (ref 3.5–5.3)
Sodium: 137 mmol/L (ref 135–146)

## 2016-11-13 LAB — CBC WITH DIFFERENTIAL/PLATELET
Basophils Absolute: 0 cells/uL (ref 0–200)
Basophils Relative: 0 %
Eosinophils Absolute: 49 cells/uL (ref 15–500)
Eosinophils Relative: 1 %
HCT: 47.8 % (ref 38.5–50.0)
Hemoglobin: 16.8 g/dL (ref 13.2–17.1)
Lymphocytes Relative: 26 %
Lymphs Abs: 1274 cells/uL (ref 850–3900)
MCH: 29.9 pg (ref 27.0–33.0)
MCHC: 35.1 g/dL (ref 32.0–36.0)
MCV: 85.2 fL (ref 80.0–100.0)
MPV: 10.4 fL (ref 7.5–12.5)
Monocytes Absolute: 1225 cells/uL — ABNORMAL HIGH (ref 200–950)
Monocytes Relative: 25 %
Neutro Abs: 2352 cells/uL (ref 1500–7800)
Neutrophils Relative %: 48 %
Platelets: 217 10*3/uL (ref 140–400)
RBC: 5.61 MIL/uL (ref 4.20–5.80)
RDW: 12.7 % (ref 11.0–15.0)
WBC: 4.9 10*3/uL (ref 4.0–10.5)

## 2016-11-13 LAB — POCT URINALYSIS DIPSTICK
Bilirubin, UA: NEGATIVE
Glucose, UA: NEGATIVE
Leukocytes, UA: NEGATIVE
Nitrite, UA: NEGATIVE
Spec Grav, UA: 1.02 (ref 1.030–1.035)
Urobilinogen, UA: NEGATIVE (ref ?–2.0)
pH, UA: 6 (ref 5.0–8.0)

## 2016-11-13 MED ORDER — PREDNISONE 10 MG (21) PO TBPK: ORAL_TABLET | Freq: Every day | ORAL | 0 refills | Status: DC

## 2016-11-13 MED ORDER — FIRST-DUKES MOUTHWASH MT SUSP: OROMUCOSAL | 0 refills | Status: DC

## 2016-11-13 NOTE — Patient Instructions (Signed)
Continue on Levaquin. Start the steroid dose pack. Use the mouthwash as directed.  Drink plenty of fluids.  We will call you with lab and XR results.   Follow up with Dr. Susann GivensLalonde next week.

## 2016-11-13 NOTE — Progress Notes (Signed)
Subjective: Chief Complaint  Patient presents with  . not feeling better    been on 3 rounds of antibiotics, tongue doesn't feel right like hes got fuzz or something on it. shortness of breath. headache stopped up. ears stopped up, cough . started levofloxacin on 3/21 and having bumps on mouth     Parker Silva is a 61 y.o. male who presents for a 3 week history of URI symptoms including sinus pressure, right ear pressure, thick discolored nasal drainage, and cough. He has taken 2 rounds of Augmentin with some improvement initially but then his symptoms got worse. He started Levofloxacin 4 days ago. States he thinks he is getting worse in spite of antibiotics in regards to sinus symptoms, productive cough and now he is having body aches, loss of appetite and fatigue.  States he thinks he is having muscle soreness due to being in the bed.  States today he felt some shortness of breath and was having to take shallow breaths to avoid coughing. Complains of bumps and sores on his tongue for the past 2-3 days. Questions whether this could be related to the new antibiotic. States he feels like he has "hair" on his tongue.    Does not smoke. Denies history of diabetes, HIV or any autoimmune disorders.  He has a history of chronic allergies and is using Flonase twice daily and oral allegra.   Denies fever, chills, dizziness, chest pain, palpitations, vomiting, diarrhea.  He would also like to mention that he has had some urinary urgency for the past 1-2 weeks. Denies dysuria, hesitancy or changes to his urine stream.    Treatment to date: antibiotics, antihistamines and nasal steroids.  Denies sick contacts.  No other aggravating or relieving factors.  No other c/o.  ROS as in subjective.   Objective: Vitals:   11/13/16 1402  BP: 120/80  Pulse: 93  Resp: 16  Temp: 98.2 F (36.8 C)    General appearance: Alert, WD/WN, no distress, mildly ill appearing                             Skin:  warm, no rash                           Head: no sinus tenderness                            Eyes: conjunctiva normal, corneas clear, PERRLA                            Ears: slightly dull TMs left >right, external ear canals normal                          Nose: septum midline, turbinates swollen, with erythema and thick discharge             Mouth/throat: MMM, tongue slightly beefy red at tip with a thin white coating, mild pharyngeal erythema                           Neck: supple, no adenopathy, no thyromegaly, nontender                          Heart: RRR,  normal S1, S2, no murmurs                         Lungs: diminished in LLF, CTA otherwise, no wheezes, rales, or rhonchi      Assessment: Cough with sputum - Plan: CBC with Differential/Platelet, Basic metabolic panel, DG Chest 2 View  Purulent nasal discharge - Plan: CBC with Differential/Platelet, Basic metabolic panel  Urinary urgency - Plan: POCT urinalysis dipstick  Chronic allergic rhinitis, unspecified seasonality, unspecified trigger  Tongue sore    Plan: UA : spec grav 1.020, trace ket, trace pro Discussed diagnosis and treatment chronic sinusitis and cough. Due to length of illness I think it is reasonable to get some blood work and chest XR to look for an underlying etiology.  Continue on antibiotic. Start steroid dose pack. Dukes mouthwash prescribed per patient request.    Suggested symptomatic OTC remedies. Nasal saline spray and neti pot for congestion. Continue on allergy treatment.  Tylenol or Ibuprofen OTC for fever and malaise.  Call next week and let Dr. Susann GivensLalonde or me know how he is doing. He may need a referral to ENT if symptoms are not improving.

## 2016-12-08 DIAGNOSIS — G44219 Episodic tension-type headache, not intractable: Secondary | ICD-10-CM | POA: Diagnosis not present

## 2016-12-08 DIAGNOSIS — G43009 Migraine without aura, not intractable, without status migrainosus: Secondary | ICD-10-CM | POA: Diagnosis not present

## 2016-12-08 DIAGNOSIS — M542 Cervicalgia: Secondary | ICD-10-CM | POA: Diagnosis not present

## 2016-12-08 DIAGNOSIS — G4489 Other headache syndrome: Secondary | ICD-10-CM | POA: Diagnosis not present

## 2016-12-30 DIAGNOSIS — M47812 Spondylosis without myelopathy or radiculopathy, cervical region: Secondary | ICD-10-CM | POA: Diagnosis not present

## 2017-01-21 ENCOUNTER — Telehealth: Payer: Self-pay

## 2017-01-21 ENCOUNTER — Telehealth: Payer: Self-pay | Admitting: Family Medicine

## 2017-01-21 DIAGNOSIS — F909 Attention-deficit hyperactivity disorder, unspecified type: Secondary | ICD-10-CM

## 2017-01-21 MED ORDER — METHYLPHENIDATE HCL ER (OSM) 36 MG PO TBCR: 36.0000 mg | EXTENDED_RELEASE_TABLET | Freq: Every day | ORAL | 0 refills | Status: DC

## 2017-01-21 NOTE — Telephone Encounter (Signed)
Pt called for refill of Concerta.  Pt ph [phone]

## 2017-01-21 NOTE — Telephone Encounter (Signed)
Pt aware scripts ready for pick up. /RLB  

## 2017-02-10 DIAGNOSIS — H01021 Squamous blepharitis right upper eyelid: Secondary | ICD-10-CM | POA: Diagnosis not present

## 2017-02-10 DIAGNOSIS — H01022 Squamous blepharitis right lower eyelid: Secondary | ICD-10-CM | POA: Diagnosis not present

## 2017-02-10 DIAGNOSIS — H01025 Squamous blepharitis left lower eyelid: Secondary | ICD-10-CM | POA: Diagnosis not present

## 2017-02-10 DIAGNOSIS — H01024 Squamous blepharitis left upper eyelid: Secondary | ICD-10-CM | POA: Diagnosis not present

## 2017-02-15 DIAGNOSIS — R5383 Other fatigue: Secondary | ICD-10-CM | POA: Diagnosis not present

## 2017-02-15 DIAGNOSIS — E7212 Methylenetetrahydrofolate reductase deficiency: Secondary | ICD-10-CM | POA: Diagnosis not present

## 2017-02-15 DIAGNOSIS — E291 Testicular hypofunction: Secondary | ICD-10-CM | POA: Diagnosis not present

## 2017-02-15 DIAGNOSIS — F329 Major depressive disorder, single episode, unspecified: Secondary | ICD-10-CM | POA: Diagnosis not present

## 2017-03-10 ENCOUNTER — Other Ambulatory Visit: Payer: Self-pay | Admitting: Family Medicine

## 2017-03-10 MED ORDER — SILDENAFIL CITRATE 20 MG PO TABS: ORAL_TABLET | ORAL | 0 refills | Status: DC

## 2017-03-10 NOTE — Telephone Encounter (Signed)
Is this okay to refill? 

## 2017-03-17 ENCOUNTER — Ambulatory Visit (INDEPENDENT_AMBULATORY_CARE_PROVIDER_SITE_OTHER): Payer: BLUE CROSS/BLUE SHIELD | Admitting: Family Medicine

## 2017-03-17 ENCOUNTER — Encounter: Payer: Self-pay | Admitting: Family Medicine

## 2017-03-17 VITALS — BP 112/70 | HR 78 | Wt 180.0 lb

## 2017-03-17 DIAGNOSIS — G44209 Tension-type headache, unspecified, not intractable: Secondary | ICD-10-CM | POA: Insufficient documentation

## 2017-03-17 DIAGNOSIS — G43009 Migraine without aura, not intractable, without status migrainosus: Secondary | ICD-10-CM | POA: Diagnosis not present

## 2017-03-17 DIAGNOSIS — L739 Follicular disorder, unspecified: Secondary | ICD-10-CM | POA: Diagnosis not present

## 2017-03-17 DIAGNOSIS — G4700 Insomnia, unspecified: Secondary | ICD-10-CM | POA: Diagnosis not present

## 2017-03-17 DIAGNOSIS — G44219 Episodic tension-type headache, not intractable: Secondary | ICD-10-CM | POA: Diagnosis not present

## 2017-03-17 MED ORDER — DOXYCYCLINE HYCLATE 100 MG PO TABS: 100.0000 mg | ORAL_TABLET | Freq: Two times a day (BID) | ORAL | 0 refills | Status: DC

## 2017-03-17 NOTE — Patient Instructions (Signed)
Use Lever 2000 or Dial soap and you can also go get Hibiclens

## 2017-03-17 NOTE — Progress Notes (Signed)
   Subjective:    Patient ID: Parker Silva, male    DOB: 1956-06-20, 61 y.o.   MRN: [id number]  HPI He has an underlying history of migraine as well as tension type headaches. He has been seen by Dr. Vela ProseLewitt in the past however apparently this gentleman is retiring. He did bring records in here including MRI and cervical spine x-rays. Presently he is stable on Topamax 25 mg, trazodone 300 mg at bedtime and uses Maxalt when he has a bad headache which is sometimes 6 times per month. He would like to continue to get these medicines remain. Approximately one week ago he had his back waxed and since then has noted a rash that seems be getting worse.   Review of Systems     Objective:   Physical Exam Alert and in no distress. Exam of his back does show diffuse erythematous maculopapular type lesions.       Assessment & Plan:  Folliculitis - Plan: doxycycline (VIBRA-TABS) 100 MG tablet  Tension headache  Migraine without aura and without status migrainosus, not intractable I will use doxycycline and also recommend soap and possibly using Hibiclens. I will refill his headache medications as needed. Will also refer to [facility]

## 2017-03-18 ENCOUNTER — Encounter: Payer: Self-pay | Admitting: Family Medicine

## 2017-03-22 DIAGNOSIS — E538 Deficiency of other specified B group vitamins: Secondary | ICD-10-CM | POA: Diagnosis not present

## 2017-03-22 DIAGNOSIS — E559 Vitamin D deficiency, unspecified: Secondary | ICD-10-CM | POA: Diagnosis not present

## 2017-03-22 DIAGNOSIS — E291 Testicular hypofunction: Secondary | ICD-10-CM | POA: Diagnosis not present

## 2017-03-22 DIAGNOSIS — R5383 Other fatigue: Secondary | ICD-10-CM | POA: Diagnosis not present

## 2017-03-22 DIAGNOSIS — E039 Hypothyroidism, unspecified: Secondary | ICD-10-CM | POA: Diagnosis not present

## 2017-04-09 DIAGNOSIS — H01025 Squamous blepharitis left lower eyelid: Secondary | ICD-10-CM | POA: Diagnosis not present

## 2017-04-09 DIAGNOSIS — H01022 Squamous blepharitis right lower eyelid: Secondary | ICD-10-CM | POA: Diagnosis not present

## 2017-04-09 DIAGNOSIS — H01021 Squamous blepharitis right upper eyelid: Secondary | ICD-10-CM | POA: Diagnosis not present

## 2017-04-09 DIAGNOSIS — H01024 Squamous blepharitis left upper eyelid: Secondary | ICD-10-CM | POA: Diagnosis not present

## 2017-04-21 ENCOUNTER — Telehealth: Payer: Self-pay

## 2017-04-21 DIAGNOSIS — F909 Attention-deficit hyperactivity disorder, unspecified type: Secondary | ICD-10-CM

## 2017-04-21 MED ORDER — METHYLPHENIDATE HCL ER (OSM) 36 MG PO TBCR: 36.0000 mg | EXTENDED_RELEASE_TABLET | Freq: Every day | ORAL | 0 refills | Status: DC

## 2017-04-21 NOTE — Telephone Encounter (Signed)
Pt called to request refills of Concerta. CB # [phone]. Trixie Rude/RLB

## 2017-04-22 ENCOUNTER — Telehealth: Payer: Self-pay | Admitting: Family Medicine

## 2017-04-22 NOTE — Telephone Encounter (Signed)
yes

## 2017-04-22 NOTE — Telephone Encounter (Signed)
[facility] fax that his insurance allows #30 tabs only per fill.  He is due for refill on 04/22/17.  Is it ok to fill on 8/30 and document 2 other rx's to be filled on 05/22/17 and 06/21/17.  [facility] fax [phone]

## 2017-04-22 NOTE — Telephone Encounter (Signed)
CALLED [facility] GAVE OKAY PER [facility]

## 2017-05-16 ENCOUNTER — Other Ambulatory Visit: Payer: Self-pay | Admitting: Family Medicine

## 2017-05-16 DIAGNOSIS — F341 Dysthymic disorder: Secondary | ICD-10-CM

## 2017-05-24 ENCOUNTER — Telehealth: Payer: Self-pay | Admitting: Family Medicine

## 2017-05-24 NOTE — Telephone Encounter (Signed)
He apparently took feet 3 prescriptions to the drugstore however they cannot find the one stated for September 30 and October 31. I rewrote them for him #30

## 2017-05-24 NOTE — Telephone Encounter (Signed)
Pt requesting refill on Concerta 36 mg. Advised him that he should have a refill but he said he does not have any script, last received script in July 2018 and he checked with the pharmacy and they do not have it. Called [facility] and Irving Burton confirmed that pt lasted refilled Concerta on 04/22/17 and [facility] does not have script on file.

## 2017-06-17 ENCOUNTER — Other Ambulatory Visit: Payer: Self-pay | Admitting: Family Medicine

## 2017-06-17 NOTE — Telephone Encounter (Signed)
Is this okay to refill? 

## 2017-07-06 ENCOUNTER — Encounter: Payer: Self-pay | Admitting: Family Medicine

## 2017-07-06 ENCOUNTER — Ambulatory Visit: Payer: BLUE CROSS/BLUE SHIELD | Admitting: Family Medicine

## 2017-07-06 VITALS — BP 140/80 | HR 79 | Temp 98.2°F | Resp 18 | Wt 181.2 lb

## 2017-07-06 DIAGNOSIS — F909 Attention-deficit hyperactivity disorder, unspecified type: Secondary | ICD-10-CM | POA: Diagnosis not present

## 2017-07-06 DIAGNOSIS — J0101 Acute recurrent maxillary sinusitis: Secondary | ICD-10-CM

## 2017-07-06 MED ORDER — AMOXICILLIN-POT CLAVULANATE 875-125 MG PO TABS: 1.0000 | ORAL_TABLET | Freq: Two times a day (BID) | ORAL | 0 refills | Status: DC

## 2017-07-06 NOTE — Progress Notes (Signed)
   Subjective:    Patient ID: Parker Silva, male    DOB: 12/27/1955, 61 y.o.   MRN: [id number]  HPI He complains of a 2-week history of difficulty with upper tooth discomfort, nasal and ear congestion, pressure over his sinuses as well as purulent postnasal drainage and now a dry cough.  No fever, chills, sore throat or true earache. He also continues on some Concerta.  He does get roughly 12 hours of benefit out of it and has no difficulty while he is on it or withdrawal symptoms.   Review of Systems     Objective:   Physical Exam Alert and in no distress. Tympanic membranes and canals are normal. Pharyngeal area is normal. Neck is supple without adenopathy or thyromegaly. Cardiac exam shows a regular sinus rhythm without murmurs or gallops. Lungs are clear to auscultation. Nasal mucosa is red with tenderness over frontal as well as maxillary sinuses.      Assessment & Plan:  Attention deficit hyperactivity disorder (ADHD), unspecified ADHD type  Acute recurrent maxillary sinusitis - Plan: amoxicillin-clavulanate (AUGMENTIN) 875-125 MG tablet  He will call if not entirely better when he finishes the antibiotic.  Renew his Concerta when appropriate.

## 2017-07-19 ENCOUNTER — Other Ambulatory Visit: Payer: Self-pay | Admitting: Family Medicine

## 2017-07-19 DIAGNOSIS — J0101 Acute recurrent maxillary sinusitis: Secondary | ICD-10-CM

## 2017-07-19 MED ORDER — AMOXICILLIN-POT CLAVULANATE 875-125 MG PO TABS: 1.0000 | ORAL_TABLET | Freq: Two times a day (BID) | ORAL | 0 refills | Status: DC

## 2017-07-26 ENCOUNTER — Telehealth: Payer: Self-pay | Admitting: Family Medicine

## 2017-07-26 DIAGNOSIS — F909 Attention-deficit hyperactivity disorder, unspecified type: Secondary | ICD-10-CM

## 2017-07-26 MED ORDER — METHYLPHENIDATE HCL ER (OSM) 36 MG PO TBCR: 36.0000 mg | EXTENDED_RELEASE_TABLET | Freq: Every day | ORAL | 0 refills | Status: DC

## 2017-07-26 NOTE — Telephone Encounter (Signed)
Pt called and is requesting a refill on his concerta pt can be reached at [phone] when ready to be picked up

## 2017-08-23 DIAGNOSIS — E559 Vitamin D deficiency, unspecified: Secondary | ICD-10-CM | POA: Diagnosis not present

## 2017-08-23 DIAGNOSIS — R5383 Other fatigue: Secondary | ICD-10-CM | POA: Diagnosis not present

## 2017-08-23 DIAGNOSIS — E291 Testicular hypofunction: Secondary | ICD-10-CM | POA: Diagnosis not present

## 2017-08-23 DIAGNOSIS — E7212 Methylenetetrahydrofolate reductase deficiency: Secondary | ICD-10-CM | POA: Diagnosis not present

## 2017-09-07 ENCOUNTER — Other Ambulatory Visit: Payer: Self-pay | Admitting: Family Medicine

## 2017-09-07 DIAGNOSIS — R5383 Other fatigue: Secondary | ICD-10-CM | POA: Diagnosis not present

## 2017-09-07 DIAGNOSIS — E7212 Methylenetetrahydrofolate reductase deficiency: Secondary | ICD-10-CM | POA: Diagnosis not present

## 2017-09-07 DIAGNOSIS — F329 Major depressive disorder, single episode, unspecified: Secondary | ICD-10-CM | POA: Diagnosis not present

## 2017-09-07 DIAGNOSIS — E291 Testicular hypofunction: Secondary | ICD-10-CM | POA: Diagnosis not present

## 2017-09-07 NOTE — Telephone Encounter (Signed)
Can pt have a refill on meds  

## 2017-09-23 ENCOUNTER — Telehealth: Payer: Self-pay | Admitting: Family Medicine

## 2017-09-23 NOTE — Telephone Encounter (Signed)
Pt called and is requesting a refill on his concerta pt uses [facility] [place] Pt made a medcheck appt for Feb the 1st

## 2017-09-23 NOTE — Telephone Encounter (Signed)
Called pharmacy and they will fill medicine for tomorrow.

## 2017-09-23 NOTE — Telephone Encounter (Signed)
My notes say he should be able to pick the prescription up on February 3.  It should already be at the drugstore

## 2017-09-24 ENCOUNTER — Ambulatory Visit (INDEPENDENT_AMBULATORY_CARE_PROVIDER_SITE_OTHER): Payer: BLUE CROSS/BLUE SHIELD | Admitting: Family Medicine

## 2017-09-24 ENCOUNTER — Encounter: Payer: Self-pay | Admitting: Family Medicine

## 2017-09-24 VITALS — BP 120/82 | HR 85 | Wt 180.0 lb

## 2017-09-24 DIAGNOSIS — Z209 Contact with and (suspected) exposure to unspecified communicable disease: Secondary | ICD-10-CM

## 2017-09-24 DIAGNOSIS — J301 Allergic rhinitis due to pollen: Secondary | ICD-10-CM

## 2017-09-24 DIAGNOSIS — Z13220 Encounter for screening for lipoid disorders: Secondary | ICD-10-CM

## 2017-09-24 DIAGNOSIS — F909 Attention-deficit hyperactivity disorder, unspecified type: Secondary | ICD-10-CM

## 2017-09-24 DIAGNOSIS — G479 Sleep disorder, unspecified: Secondary | ICD-10-CM | POA: Diagnosis not present

## 2017-09-24 DIAGNOSIS — G43009 Migraine without aura, not intractable, without status migrainosus: Secondary | ICD-10-CM

## 2017-09-24 DIAGNOSIS — F341 Dysthymic disorder: Secondary | ICD-10-CM

## 2017-09-24 DIAGNOSIS — G44209 Tension-type headache, unspecified, not intractable: Secondary | ICD-10-CM

## 2017-09-24 LAB — POCT URINALYSIS DIP (PROADVANTAGE DEVICE)
Bilirubin, UA: NEGATIVE
Blood, UA: NEGATIVE
Glucose, UA: NEGATIVE mg/dL
Ketones, POC UA: NEGATIVE mg/dL
Leukocytes, UA: NEGATIVE
Nitrite, UA: NEGATIVE
Protein Ur, POC: NEGATIVE mg/dL
Specific Gravity, Urine: 1.025
Urobilinogen, Ur: 3.5
pH, UA: 6 (ref 5.0–8.0)

## 2017-09-24 LAB — LIPID PANEL
Chol/HDL Ratio: 4.6 ratio (ref 0.0–5.0)
Cholesterol, Total: 165 mg/dL (ref 100–199)
HDL: 36 mg/dL — ABNORMAL LOW (ref >39)
LDL Calculated: 109 mg/dL — ABNORMAL HIGH (ref 0–99)
Triglycerides: 99 mg/dL (ref 0–149)
VLDL Cholesterol Cal: 20 mg/dL (ref 5–40)

## 2017-09-24 NOTE — Progress Notes (Signed)
   Subjective:    Patient ID: Parker Silva, male    DOB: 04-21-1956, 62 y.o.   MRN: [id number]  HPI He is here for medication management visit.  He does have underlying ADHD and continues on Concerta.  He gets roughly 10 hours of benefit out of this.  He does use his religiously on a daily basis.  He continues on Wellbutrin for his underlying dysthymia but has still had some difficulty with depression-like symptoms.  Presently he is going to an alternative medicine practice in [place].  He presently is on testosterone as well as Armour Thyroid from them and apparently blood work does show a low testosterone as well as thyroid function levels however I do not have a copy of them.  He also is on multiple vitamins through them and states that he is feeling much better.  He states that his migraine headaches seem to be under much better control with his present medication regimen as well as acupuncture.  He is fasting and would like a lipid panel done. He was recently in a relationship however that broke up. Review of Systems     Objective:   Physical Exam Alert and in no distress otherwise not examined       Assessment & Plan:  Attention deficit hyperactivity disorder (ADHD), unspecified ADHD type  Allergic rhinitis due to pollen, unspecified seasonality  Migraine without aura and without status migrainosus, not intractable  Sleep disturbance  Tension headache  Dysthymia  Screening for lipid disorders - Plan: Lipid panel  Contact with or exposure to communicable disease - Plan: RPR+HIV+GC+CT Panel He is to bring by his results concerning testosterone and thyroid.  Otherwise he will continue on his present medication regimen.

## 2017-09-26 LAB — RPR+HIV+GC+CT PANEL
Chlamydia trachomatis, NAA: NEGATIVE
HIV Screen 4th Generation wRfx: NONREACTIVE
Neisseria gonorrhoeae by PCR: NEGATIVE
RPR Ser Ql: NONREACTIVE

## 2017-09-27 ENCOUNTER — Telehealth: Payer: Self-pay | Admitting: Family Medicine

## 2017-09-27 NOTE — Telephone Encounter (Signed)
Pt dropped off labs for you to go over, pt can be reached at [phone] put in your folder

## 2017-09-28 ENCOUNTER — Encounter: Payer: Self-pay | Admitting: Family Medicine

## 2017-10-22 ENCOUNTER — Other Ambulatory Visit: Payer: Self-pay | Admitting: Family Medicine

## 2017-10-22 DIAGNOSIS — F909 Attention-deficit hyperactivity disorder, unspecified type: Secondary | ICD-10-CM

## 2017-10-22 MED ORDER — METHYLPHENIDATE HCL ER (OSM) 36 MG PO TBCR: 36.0000 mg | EXTENDED_RELEASE_TABLET | Freq: Every day | ORAL | 0 refills | Status: DC

## 2017-10-22 NOTE — Telephone Encounter (Signed)
Please advise if Concert[facility] is ok to [facility]. Thanks [employer]

## 2017-11-23 ENCOUNTER — Telehealth: Payer: Self-pay | Admitting: Family Medicine

## 2017-11-23 ENCOUNTER — Other Ambulatory Visit: Payer: Self-pay | Admitting: Family Medicine

## 2017-11-23 DIAGNOSIS — J0101 Acute recurrent maxillary sinusitis: Secondary | ICD-10-CM

## 2017-11-23 MED ORDER — AMOXICILLIN-POT CLAVULANATE 875-125 MG PO TABS: 1.0000 | ORAL_TABLET | Freq: Two times a day (BID) | ORAL | 0 refills | Status: DC

## 2017-11-23 NOTE — Telephone Encounter (Signed)
New Message  Pt verbalized he has a sinus infection and wondering if someone can call him in an rx.  Please f/u with pt

## 2017-11-23 NOTE — Telephone Encounter (Signed)
Pleases advise if something can be called in for pt. He thinks he has a sinus infection. KH

## 2017-11-23 NOTE — Telephone Encounter (Signed)
Pt called back and states that he has had a sinus infection for the past 10 days he is has sinus pain  Pressure, sinus headache,  And had colored mucous greenish, yellow coming out, he has been taking OTC mucin ex and benadryl to try and help, pt uses  [facility] - [place], [place] Pt can be reached at [phone]

## 2017-11-23 NOTE — Telephone Encounter (Signed)
He is again having difficulty with sinus infection.  I will place him back on Augmentin.

## 2017-11-24 NOTE — Telephone Encounter (Signed)
[facility] is requesting to fill pt sildenafil. Please advise if this is ok. KH

## 2017-11-29 ENCOUNTER — Other Ambulatory Visit: Payer: Self-pay | Admitting: Family Medicine

## 2017-11-29 DIAGNOSIS — F341 Dysthymic disorder: Secondary | ICD-10-CM

## 2017-11-29 NOTE — Telephone Encounter (Signed)
[facility] is requesting to fill pt welbutrin.  Please advise. Kh

## 2017-12-07 DIAGNOSIS — R7989 Other specified abnormal findings of blood chemistry: Secondary | ICD-10-CM | POA: Diagnosis not present

## 2017-12-07 DIAGNOSIS — E039 Hypothyroidism, unspecified: Secondary | ICD-10-CM | POA: Diagnosis not present

## 2018-01-21 ENCOUNTER — Telehealth: Payer: Self-pay | Admitting: Family Medicine

## 2018-01-21 DIAGNOSIS — F909 Attention-deficit hyperactivity disorder, unspecified type: Secondary | ICD-10-CM

## 2018-01-21 MED ORDER — METHYLPHENIDATE HCL ER (OSM) 36 MG PO TBCR: 36.0000 mg | EXTENDED_RELEASE_TABLET | Freq: Every day | ORAL | 0 refills | Status: DC

## 2018-01-21 MED ORDER — METHYLPHENIDATE HCL ER (OSM) 36 MG PO TBCR: EXTENDED_RELEASE_TABLET | ORAL | 0 refills | Status: DC

## 2018-01-21 NOTE — Telephone Encounter (Signed)
Pt needs refill Concerta to [facility] runs out tomorrow

## 2018-02-04 ENCOUNTER — Encounter: Payer: Self-pay | Admitting: Family Medicine

## 2018-02-04 ENCOUNTER — Ambulatory Visit: Payer: BLUE CROSS/BLUE SHIELD | Admitting: Family Medicine

## 2018-02-04 VITALS — BP 120/76 | HR 76 | Temp 97.7°F | Resp 16 | Wt 183.8 lb

## 2018-02-04 DIAGNOSIS — H6691 Otitis media, unspecified, right ear: Secondary | ICD-10-CM | POA: Diagnosis not present

## 2018-02-04 DIAGNOSIS — J019 Acute sinusitis, unspecified: Secondary | ICD-10-CM | POA: Diagnosis not present

## 2018-02-04 MED ORDER — AMOXICILLIN-POT CLAVULANATE 875-125 MG PO TABS: 1.0000 | ORAL_TABLET | Freq: Two times a day (BID) | ORAL | 0 refills | Status: DC

## 2018-02-04 MED ORDER — METHYLPREDNISOLONE SODIUM SUCC 125 MG IJ SOLR
125.0000 mg | Freq: Once | INTRAMUSCULAR | Status: AC
  Administered 2018-02-04: 125 mg via INTRAMUSCULAR

## 2018-02-04 NOTE — Progress Notes (Signed)
Subjective:  Parker Silva is a 62 y.o. male who presents for a one month history of decreased hearing and itching in his right ear.  Using alcohol and peroxide. Felt like he had water in his right ear.  Then he noticed a frontal headache and sinus pressure.  Now he has severe nasal congestion and sore throat along with post nasal drainage. Feels like he is getting worse and not allergy related any longer.   Denies fever, chills, chest pain, cough, shortness of breath, abdominal pain, N/V/D.  He has underlying allergies.   Treatment to date: antihistamines and decongestants. Allegra and Flonase.  Denies sick contacts.  No other aggravating or relieving factors.  No other c/o.  ROS as in subjective.   Objective: Vitals:   02/04/18 0921  BP: 120/76  Pulse: 76  Resp: 16  Temp: 97.7 F (36.5 C)  SpO2: 97%    General appearance: Alert, WD/WN, no distress, mildly ill appearing                             Skin: warm, no rash                           Head: frontal sinus tenderness                            Eyes: conjunctiva normal, corneas clear, PERRLA                            Ears: right TM with erythema, retracted TM;pearly left TM, external ear canals normal                          Nose: septum midline, turbinates swollen, with erythema and thick discharge             Mouth/throat: MMM, tongue normal, mild pharyngeal erythema                           Neck: supple, no adenopathy, no thyromegaly, nontender                          Heart: RRR, normal S1, S2, no murmurs                         Lungs: CTA bilaterally, no wheezes, rales, or rhonchi      Assessment: Acute sinusitis with symptoms > 10 days - Plan: amoxicillin-clavulanate (AUGMENTIN) 875-125 MG tablet, methylPREDNISolone sodium succinate (SOLU-MEDROL) 125 mg/2 mL injection 125 mg  Acute otitis media, right - Plan: amoxicillin-clavulanate (AUGMENTIN) 875-125 MG tablet, methylPREDNISolone sodium succinate  (SOLU-MEDROL) 125 mg/2 mL injection 125 mg    Plan: Discussed diagnosis and treatment of acute sinusitis and otitis media. Steroid injection given in office. Augmentin prescribed.  Suggested symptomatic OTC remedies. Nasal saline spray for congestion.  Tylenol or Ibuprofen OTC for fever and malaise.  Call/return if not back to baseline after completing the antibiotic.

## 2018-02-17 ENCOUNTER — Telehealth: Payer: Self-pay | Admitting: Family Medicine

## 2018-02-17 NOTE — Telephone Encounter (Signed)
Rec[facility]'d [facility] pt going out of town 02/19/18 and needs to fill Concerta early, ok per [employer]

## 2018-02-25 ENCOUNTER — Encounter: Payer: Self-pay | Admitting: Family Medicine

## 2018-02-25 ENCOUNTER — Ambulatory Visit: Payer: BLUE CROSS/BLUE SHIELD | Admitting: Family Medicine

## 2018-02-25 ENCOUNTER — Telehealth: Payer: Self-pay

## 2018-02-25 VITALS — BP 110/70 | HR 84 | Temp 98.4°F | Resp 16 | Wt 188.8 lb

## 2018-02-25 DIAGNOSIS — H6693 Otitis media, unspecified, bilateral: Secondary | ICD-10-CM

## 2018-02-25 DIAGNOSIS — J329 Chronic sinusitis, unspecified: Secondary | ICD-10-CM

## 2018-02-25 DIAGNOSIS — Z9109 Other allergy status, other than to drugs and biological substances: Secondary | ICD-10-CM

## 2018-02-25 MED ORDER — AMOXICILLIN-POT CLAVULANATE 875-125 MG PO TABS: 1.0000 | ORAL_TABLET | Freq: Two times a day (BID) | ORAL | 0 refills | Status: DC

## 2018-02-25 NOTE — Progress Notes (Signed)
Subjective:  Parker Silva is a 62 y.o. male with a history of allergies who presents for a bilateral ear pain, muffled sounds,  nasal congestion, frontal headache, pressure behind his eyes, post nasal drainage and mild cough. States yesterday he felt dizzy for a couple He was prescribed an antibiotic and steroids on February 04 2018 for acute sinusitis with otitis media and states he did get significantly better but only for a few days and symptoms returned.   States he feels like he has asthma when he is working outside. Negative for asthma in 2012 per patient. He was worked up by an Proofreaderallergist at that time.  Reports history of recurrent sinusitis with a CT of his sinuses in 2012. Denies seeing ENT in the past.   Underlying allergies- taking Flonase and Allegra daily. Has been on Allegra for 10 years. Started using Benadryl at bedtime x past 3 nights as well.  States he has been using a neti pot daily for years.   Denies fever, chills, sore throat, chest pain, palpitations, abdominal pain, N/V/D.   No other aggravating or relieving factors.  No other c/o.  ROS as in subjective.   Objective: Vitals:   02/25/18 1419  BP: 110/70  Pulse: 84  Resp: 16  Temp: 98.4 F (36.9 C)  SpO2: 98%    General appearance: Alert, WD/WN, no distress, mildly ill appearing                             Skin: warm, no rash                           Head: + frontal sinus tenderness                            Eyes: conjunctiva normal, corneas clear, PERRLA                            Ears: TMs with erythema, retracted on left and scarring on right, external ear canals normal                          Nose: septum midline, turbinates swollen, with erythema and no discharge             Mouth/throat: MMM, tongue normal, mild pharyngeal erythema, no exudate                           Neck: supple, no adenopathy, no thyromegaly, nontender                          Heart: RRR, normal S1, S2, no murmurs         Lungs: CTA bilaterally, no wheezes, rales, or rhonchi      Assessment: Acute bilateral otitis media - Plan: amoxicillin-clavulanate (AUGMENTIN) 875-125 MG tablet  Recurrent sinusitis - Plan: amoxicillin-clavulanate (AUGMENTIN) 875-125 MG tablet  History of environmental allergies    Plan: Discussed diagnosis and treatment of recurrent sinusitis and otitis media. Augmentin prescribed and referral to ENT. He will see ENT next week.  Suggested symptomatic OTC remedies. Switch from Allegra to Xyzal. He has been using the neti pot daily for years. This is concerning. Will appreciate ENT feedback.

## 2018-02-25 NOTE — Patient Instructions (Addendum)
Switch from Allegra to Xyzal.

## 2018-02-25 NOTE — Telephone Encounter (Signed)
error 

## 2018-03-01 ENCOUNTER — Other Ambulatory Visit: Payer: Self-pay | Admitting: Family Medicine

## 2018-03-01 DIAGNOSIS — G43909 Migraine, unspecified, not intractable, without status migrainosus: Secondary | ICD-10-CM | POA: Diagnosis not present

## 2018-03-01 DIAGNOSIS — J329 Chronic sinusitis, unspecified: Secondary | ICD-10-CM | POA: Diagnosis not present

## 2018-03-01 DIAGNOSIS — H9193 Unspecified hearing loss, bilateral: Secondary | ICD-10-CM | POA: Diagnosis not present

## 2018-03-01 DIAGNOSIS — J320 Chronic maxillary sinusitis: Secondary | ICD-10-CM | POA: Diagnosis not present

## 2018-03-01 DIAGNOSIS — J343 Hypertrophy of nasal turbinates: Secondary | ICD-10-CM | POA: Diagnosis not present

## 2018-03-01 DIAGNOSIS — H6523 Chronic serous otitis media, bilateral: Secondary | ICD-10-CM | POA: Diagnosis not present

## 2018-03-02 NOTE — Telephone Encounter (Signed)
Is this ok?

## 2018-03-07 DIAGNOSIS — H9113 Presbycusis, bilateral: Secondary | ICD-10-CM | POA: Diagnosis not present

## 2018-03-07 DIAGNOSIS — H903 Sensorineural hearing loss, bilateral: Secondary | ICD-10-CM | POA: Diagnosis not present

## 2018-03-14 DIAGNOSIS — E559 Vitamin D deficiency, unspecified: Secondary | ICD-10-CM | POA: Diagnosis not present

## 2018-03-14 DIAGNOSIS — E291 Testicular hypofunction: Secondary | ICD-10-CM | POA: Diagnosis not present

## 2018-03-14 DIAGNOSIS — R5383 Other fatigue: Secondary | ICD-10-CM | POA: Diagnosis not present

## 2018-03-15 DIAGNOSIS — R5383 Other fatigue: Secondary | ICD-10-CM | POA: Diagnosis not present

## 2018-03-15 DIAGNOSIS — E291 Testicular hypofunction: Secondary | ICD-10-CM | POA: Diagnosis not present

## 2018-03-15 DIAGNOSIS — E7212 Methylenetetrahydrofolate reductase deficiency: Secondary | ICD-10-CM | POA: Diagnosis not present

## 2018-03-15 DIAGNOSIS — G2581 Restless legs syndrome: Secondary | ICD-10-CM | POA: Diagnosis not present

## 2018-03-15 DIAGNOSIS — E559 Vitamin D deficiency, unspecified: Secondary | ICD-10-CM | POA: Diagnosis not present

## 2018-03-30 ENCOUNTER — Other Ambulatory Visit: Payer: Self-pay | Admitting: Family Medicine

## 2018-03-30 ENCOUNTER — Other Ambulatory Visit: Payer: Self-pay | Admitting: Medical

## 2018-03-30 MED ORDER — TRAZODONE HCL 100 MG PO TABS: 300.0000 mg | ORAL_TABLET | Freq: Every day | ORAL | 0 refills | Status: DC

## 2018-03-30 NOTE — Telephone Encounter (Signed)
Is this ok to refill?  

## 2018-03-30 NOTE — Telephone Encounter (Signed)
Pt called and stated that he will be out of town and will need his refill to be sent to pharmacy updated on his chart.

## 2018-04-12 ENCOUNTER — Ambulatory Visit (INDEPENDENT_AMBULATORY_CARE_PROVIDER_SITE_OTHER): Payer: BLUE CROSS/BLUE SHIELD | Admitting: Family Medicine

## 2018-04-12 ENCOUNTER — Encounter: Payer: Self-pay | Admitting: Family Medicine

## 2018-04-12 VITALS — BP 104/74 | HR 77 | Temp 97.4°F | Wt 190.4 lb

## 2018-04-12 DIAGNOSIS — G4750 Parasomnia, unspecified: Secondary | ICD-10-CM

## 2018-04-12 DIAGNOSIS — G479 Sleep disorder, unspecified: Secondary | ICD-10-CM

## 2018-04-12 DIAGNOSIS — F909 Attention-deficit hyperactivity disorder, unspecified type: Secondary | ICD-10-CM

## 2018-04-12 DIAGNOSIS — G43009 Migraine without aura, not intractable, without status migrainosus: Secondary | ICD-10-CM | POA: Diagnosis not present

## 2018-04-12 DIAGNOSIS — G44209 Tension-type headache, unspecified, not intractable: Secondary | ICD-10-CM

## 2018-04-12 MED ORDER — METHYLPHENIDATE HCL ER (OSM) 36 MG PO TBCR: 36.0000 mg | EXTENDED_RELEASE_TABLET | Freq: Every day | ORAL | 0 refills | Status: DC

## 2018-04-12 NOTE — Progress Notes (Signed)
   Subjective:    Patient ID: Parker Silva, male    DOB: 23-Jun-1956, 62 y.o.   MRN: [id number]  HPI He is here for consult concerning multiple issues.  He states that he thinks he has RLS.  He states that he can go to bed and not have any leg movement but is awakened several hours later with a jerking and throbbing sensation in his legs.  He states that his mother as well as brother have the same thing.  He also notes that he has had difficulty with sleep walking as well as talking since childhood.  He does have an underlying history of tension headache as well as migraine headache and has seen Dr. Vela ProseLewitt in the past ..  He is taking trazodone as well as Topamax.  The trazodone is mainly used for sleep purposes and he uses it on an as-needed basis.  He also has underlying ADHD and is doing well on Concerta.  He does note that he continues to remain fidgety.   Review of Systems     Objective:   Physical Exam Alert and in no distress otherwise not examined       Assessment & Plan:  Attention deficit hyperactivity disorder (ADHD), unspecified ADHD type - Plan: methylphenidate (CONCERTA) 36 MG PO CR tablet  Sleep disturbance - Plan: Ambulatory referral to Neurology  Parasomnia, unspecified type - Plan: Ambulatory referral to Neurology  Migraine without aura and without status migrainosus, not intractable  Tension headache I explained that his symptoms are not necessarily RLS related and explained that there can be other leg movement type of problems and did mention periodic leg movement syndrome.  He also has a history consistent with parasomnias.  This in addition to his ADHD warrants further investigation.  I explained that I will refer him to Dr. Vickey Hugerohmeier and that he would most likely get a sleep study.  This also might involve readjusting his medications including his ADHD medication. It has been a challenge to get him his Concerta as even if he misses one day, he gets quite upset  over this.  I have written for 31 days with appropriate refills however I think the insurance company is only giving him a 30-day supply.  I will give him a one-month supply and then get input from Dr. Vickey Hugerohmeier concerning any medication changes that might be appropriate. Over 25 minutes, the entire time spent in counseling and coordination of care.

## 2018-04-26 ENCOUNTER — Other Ambulatory Visit: Payer: Self-pay | Admitting: Family Medicine

## 2018-04-26 NOTE — Telephone Encounter (Signed)
[facility] is requesting to fill pt flurbiprofen. Please advise [facility]

## 2018-04-28 ENCOUNTER — Other Ambulatory Visit: Payer: Self-pay | Admitting: Medical

## 2018-04-28 NOTE — Telephone Encounter (Signed)
[facility] requesting to fill pt trazadone . Please advise [facility]

## 2018-04-28 NOTE — Telephone Encounter (Signed)
yours

## 2018-04-29 MED ORDER — TRAZODONE HCL 100 MG PO TABS: 300.0000 mg | ORAL_TABLET | Freq: Every day | ORAL | 0 refills | Status: DC

## 2018-04-29 NOTE — Addendum Note (Signed)
Addended by: Ronnald Nian on: 04/29/2018 08:17 AM   Modules accepted: Orders

## 2018-05-17 ENCOUNTER — Encounter: Payer: Self-pay | Admitting: Family Medicine

## 2018-05-18 ENCOUNTER — Telehealth: Payer: Self-pay | Admitting: Family Medicine

## 2018-05-18 ENCOUNTER — Other Ambulatory Visit: Payer: Self-pay | Admitting: Family Medicine

## 2018-05-18 DIAGNOSIS — F909 Attention-deficit hyperactivity disorder, unspecified type: Secondary | ICD-10-CM

## 2018-05-18 MED ORDER — METHYLPHENIDATE HCL ER (OSM) 36 MG PO TBCR: 36.0000 mg | EXTENDED_RELEASE_TABLET | Freq: Every day | ORAL | 0 refills | Status: DC

## 2018-05-18 MED ORDER — METHYLPHENIDATE HCL ER (OSM) 36 MG PO TBCR: EXTENDED_RELEASE_TABLET | ORAL | 0 refills | Status: DC

## 2018-05-18 NOTE — Telephone Encounter (Signed)
The prescriptions were resent to start the beginning of each month.  His last prescription was filled on September 1.

## 2018-05-18 NOTE — Telephone Encounter (Signed)
Parker Silva [facility] called.  Pt last refilled 04/24/18 on his concerta.  You dated 10/12, 11/12 and 12/12.  Ok given for 05/24/18.

## 2018-05-31 ENCOUNTER — Other Ambulatory Visit: Payer: Self-pay | Admitting: Medical

## 2018-05-31 NOTE — Telephone Encounter (Signed)
Is this ok to refill?  

## 2018-05-31 NOTE — Telephone Encounter (Signed)
This is Dr. Lalonde's 

## 2018-06-02 ENCOUNTER — Telehealth: Payer: Self-pay | Admitting: Neurology

## 2018-06-02 ENCOUNTER — Encounter: Payer: Self-pay | Admitting: Neurology

## 2018-06-02 ENCOUNTER — Ambulatory Visit: Payer: BLUE CROSS/BLUE SHIELD | Admitting: Neurology

## 2018-06-02 VITALS — BP 142/83 | HR 74 | Ht 72.0 in | Wt 190.0 lb

## 2018-06-02 DIAGNOSIS — M549 Dorsalgia, unspecified: Secondary | ICD-10-CM | POA: Diagnosis not present

## 2018-06-02 DIAGNOSIS — M4315 Spondylolisthesis, thoracolumbar region: Secondary | ICD-10-CM

## 2018-06-02 DIAGNOSIS — G253 Myoclonus: Secondary | ICD-10-CM | POA: Diagnosis not present

## 2018-06-02 DIAGNOSIS — G4701 Insomnia due to medical condition: Secondary | ICD-10-CM

## 2018-06-02 DIAGNOSIS — G2581 Restless legs syndrome: Secondary | ICD-10-CM | POA: Diagnosis not present

## 2018-06-02 DIAGNOSIS — F909 Attention-deficit hyperactivity disorder, unspecified type: Secondary | ICD-10-CM

## 2018-06-02 DIAGNOSIS — M5030 Other cervical disc degeneration, unspecified cervical region: Secondary | ICD-10-CM

## 2018-06-02 MED ORDER — DIAZEPAM 5 MG PO TABS: 5.0000 mg | ORAL_TABLET | Freq: Four times a day (QID) | ORAL | 0 refills | Status: DC | PRN

## 2018-06-02 MED ORDER — TRAMADOL HCL 50 MG PO TABS: 50.0000 mg | ORAL_TABLET | Freq: Four times a day (QID) | ORAL | 0 refills | Status: AC | PRN

## 2018-06-02 MED ORDER — METHYLPHENIDATE HCL ER (OSM) 36 MG PO TBCR: 36.0000 mg | EXTENDED_RELEASE_TABLET | Freq: Every day | ORAL | 0 refills | Status: DC

## 2018-06-02 NOTE — Telephone Encounter (Signed)
[employer] Auth: [id number] (exp. 06/02/18 to 07/01/18) pt wants to go to [facility] due to the cost order faxed. Patient requested it at [facility] due to the cost.

## 2018-06-02 NOTE — Progress Notes (Signed)
SLEEP MEDICINE CLINIC   Provider:  Melvyn Novas, [place]  Primary Care Physician:  Ronnald Nian, MD   Referring Provider: Karenann Cai, MD     Chief Complaint  Patient presents with  . New Patient (Initial Visit)    pt alone, rm 10. pt states he has a hard time sleeping due to RLS and unable to turn brain off at night. pt has been told that he snores and stops breathing at night. never had a sleep study.     HPI:  Parker Silva is a 62 y.o. male patient seen on 06-02-2018 in a referral from Dr. Susann Givens for RLS and insomnia. He reports having frequent sharp headaches, ice pick type, all his life- they turned in migraines over 20 years ago. Uses Maxalt. Has occipital tension and neck pain.  Anxiety, social discomfort with logasthenia/ dyslexia. Has had a lot of head injuries [place], had struggles anyway  With ADD/ AHD and learning disability. Has back pain, and today has severe shooting radiculopathic pain.  Dr. Ethelene Hal has treated him with " shots ' to the cervical spine. He is fatigued, can hardly sleep and is very ,very sleepy, drained of energy. He aches to sleep restfully.      Chief complaint according to patient :RLS. Has back pain, and today has severe shooting radiculopathic pain.  He is fatigued, can hardly sleep and is very ,very sleepy, drained of energy. He aches to sleep restfully.   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  He tries to distract himself watching TV-  He feels he is light sleep.  He goes to the bedroom, watches Netflix -  RLS affect his sleep latency, he jumps, his limbs jerk, and he finally falls asleep before midnight. The Tv has a timer, bed- room is cool, quiet and dark. He dreams. He wakes up at 2 AM, restless legs, walks around, leaves the house, goes to the garden. He may be able to get to sleep about 2 hours later, rubs his legs, takes magnesium.  Sleeping another 3-4 hours , He wakes with difficulties at 8-9 AM , the best sleep in the morning. the rising sun already penetrating the room. No Naps.    Sleep medical history and family sleep history: mother and MGM had restless legs.   Social history: single, raised on a farm. Non smoker, non ETOH, caffeine- iced tea in AM , latest 11 AM. Self employed for 42 years.  Lives alone, partner has his own place.   Review of Systems: Out of a complete 14 system review, the patient complains of only the following symptoms, and all other reviewed systems are negative.  Epworth score 10/ 24  , Fatigue severity score 56/ 63 , depression score 5/ 15    Social History   Socioeconomic History  . Marital status: Single    Spouse name: Not on file  . Number of children: 0  . Years of education: Not on file  . Highest education level: Not on file  Occupational History  . Occupation: owns Teacher, early years/pre: [employer]  Social Needs  . Financial resource strain: Not on file  . Food insecurity:    Worry: Not on file    Inability: Not on file  . Transportation needs:    Medical: Not on file  Non-medical: Not on file  Tobacco Use  . Smoking status: Never Smoker  . Smokeless tobacco: Never Used  Substance and Sexual Activity  . Alcohol use: Yes    Comment: no  . Drug use: No  . Sexual activity: Not Currently  Lifestyle  . Physical activity:    Days per week: Not on file    Minutes per session: Not on file  . Stress: Not on file  Relationships  . Social connections:    Talks on phone: Not on file    Gets together: Not on file    Attends religious service: Not on file    Active member of club or organization: Not on file    Attends meetings of clubs or organizations: Not on file    Relationship status: Not on file  . Intimate partner violence:    Fear of current or ex partner: Not on file    Emotionally abused: Not on file    Physically abused: Not on  file    Forced sexual activity: Not on file  Other Topics Concern  . Not on file  Social History Narrative   Lives alone in a one story home in [place].    Family History  Problem Relation Age of Onset  . Breast cancer Mother   . Diabetes type II Father   . Heart failure Father   . COPD Father   . Colon cancer Neg Hx   . Rectal cancer Neg Hx   . Stomach cancer Neg Hx     Past Medical History:  Diagnosis Date  . ADHD (attention deficit hyperactivity disorder)   . Allergy    RHINITIS-seasonal  . Anal fissure   . Arthritis    back,neck  . Dyslexia   . ED (erectile dysfunction)   . Hemorrhoids   . Hemorrhoids, internal, with bleeding 02/13/2015   Grade 2 hemorrhoids 02/13/15 band ligation right posterior hemorrhoidal bundle 03/04/15 band ligation right anterior hemorrhoidal bundle 04/15/15 band ligation left lateral hemorrhoidal bundle   . History of migraine headaches   . Sleep disturbance   . Tension headache     Past Surgical History:  Procedure Laterality Date  . APPENDECTOMY    . COLONOSCOPY  2006  . HEMORRHOID BANDING    . TONSILLECTOMY      Current Outpatient Medications  Medication Sig Dispense Refill  . buPROPion (WELLBUTRIN SR) 150 MG 12 hr tablet TAKE 1 TABLET ONCE DAILY. 90 tablet 3  . Cholecalciferol (VITAMIN D3) 5000 units TABS Take 1 tablet by mouth 2 (two) times daily.    Marland Kitchen FEVERFEW PO Take 2 tablets by mouth daily.    . fexofenadine (ALLEGRA) 30 MG tablet Take 30 mg by mouth 2 (two) times daily.    . flurbiprofen (ANSAID) 50 MG tablet Take 50 mg by mouth as needed.    . fluticasone (FLONASE) 50 MCG/ACT nasal spray USE 2 SPRAYS IN EACH NOSTRIL ONCE DAILY 16 g 11  . levocetirizine (XYZAL ALLERGY 24HR) 5 MG tablet Take 5 mg by mouth every evening.    . Magnesium 500 MG TABS Take 1 tablet by mouth daily.    . Menaquinone-7 (VITAMIN K2 PO) Take 150 mg by mouth daily.    . methylphenidate (CONCERTA) 36 MG PO CR tablet Take 1 tablet (36 mg total) by mouth  daily. 31 tablet 0  . Methylsulfonylmethane (MSM PO) Take 1,000 mg by mouth 4 (four) times daily.    . Multiple Vitamins-Minerals (MULTIVITAMIN WITH MINERALS) tablet Take  1 tablet by mouth daily. Reported on 03/04/2016    . Omega-3 Fatty Acids (SUPER OMEGA 3 PO) Take 4 capsules by mouth daily.    Marland Kitchen OVER THE COUNTER MEDICATION 2 tablets daily. Florassust mood    . OVER THE COUNTER MEDICATION 100 mg 3 (three) times daily. Super Ubiquinol    . Prasterone, DHEA, (DHEA 50 PO) Take 1 tablet by mouth daily.    . rizatriptan (MAXALT) 10 MG tablet Take 10 mg by mouth as needed (not had in 1 year as of 01-23-2015). May repeat in 2 hours if needed    . testosterone cypionate (DEPOTESTOTERONE CYPIONATE) 100 MG/ML injection Inject into the muscle once a week. For IM use only    . thyroid (ARMOUR) 30 MG tablet Take 30 mg by mouth daily before breakfast.    . topiramate (TOPAMAX) 25 MG capsule Take 25 mg by mouth daily.     . traZODone (DESYREL) 100 MG tablet TAKE 3 TABLETS AT BEDTIME. 90 tablet 0  . vitamin A 16109 UNIT capsule Take 10,000 Units by mouth daily.    . sildenafil (REVATIO) 20 MG tablet TAKE UP TO 5 TABLETS DAILY AS NEEDED. (Patient not taking: Reported on 06/02/2018) 20 tablet 0   No current facility-administered medications for this visit.     Allergies as of 06/02/2018  . (No Known Allergies)    Vitals: BP (!) 142/83   Pulse 74   Ht 6' (1.829 m)   Wt 190 lb (86.2 kg)   BMI 25.77 kg/m  Last Weight:  Wt Readings from Last 1 Encounters:  06/02/18 190 lb (86.2 kg)   UEA:VWUJ mass index is 25.77 kg/m.     Last Height:   Ht Readings from Last 1 Encounters:  06/02/18 6' (1.829 m)    Physical exam:  General: The patient is awake, alert and appears not in acute distress. The patient is well groomed. Head: Normocephalic, atraumatic. Neck is supple. Mallampati 3,  neck circumference:16. Nasal airflow congested , TMJ-no click  evident . Retrognathia is not seen.  Cardiovascular:   Regular rate and rhythm , without  murmurs or carotid bruit, and without distended neck veins. Respiratory: Lungs are clear to auscultation. Skin:  Without evidence of edema, or rash Trunk: BMI is 25.77. The patient's posture is affected by back pain   Neurologic exam : The patient is awake and alert, oriented to place and time.       Attention span & concentration ability appears normal.  Speech is fluent,  without  dysarthria, dysphonia or aphasia.  Mood and affect are appropriate.  Cranial nerves: Pupils are equal and briskly reactive to light. Funduscopic exam without  evidence of pallor or edema. Extraocular movements  in vertical and horizontal planes intact and without nystagmus. Visual fields by finger perimetry are intact. Hearing to finger rub intact.   Facial sensation intact to fine touch.  Facial motor strength is symmetric and tongue and uvula move midline. Shoulder shrug was symmetrical.   Motor exam:   Normal tone, muscle bulk and symmetric strength in upper extremities. He is in severe pain and cannot walk normally.   Sensory:  Fine touch, pinprick and vibration were tested in all extremities.  Shooting pain in the groin, lower abs.  Proprioception tested in the upper extremities was normal.  Coordination: Rapid alternating movements in the fingers/hands was normal. Finger-to-nose maneuver  normal without evidence of ataxia, dysmetria or tremor.  Gait and station: Patient walks without assistive device- he is  in pain.  Deep tendon reflexes: in the d lower extremities are brisk, symmetric and intact.  Babinski maneuver response is  downgoing.   Assessment:  After physical and neurologic examination, review of laboratory studies,  Personal review of imaging studies, reports of other /same  Imaging studies, results of polysomnography and / or neurophysiology testing and pre-existing records as far as provided in visit., my assessment is :  Parker Silva is a  62 year old Caucasian right-handed gentleman, who was presenting today primarily to evaluate her sleep disorder.  There is a history of restless legs with strong family history, he has been suffering from insomnia, there is an overlap with depression.  All this has manifested in high fatigue and high Epworth sleepiness score. He also has nasal congestion, frequent sinusitis.  Today there is however an additional difficulty for him to go to sleep and he has developed quite severe lower back pain, stiffness in both legs, the pain shoots towards the lower abdomen and groin, and he has also abdominal pain and nausea.  He has a hard time finding a comfortable position and is seated at a sharp angle 90 degrees. No incontinence.   1) I will refer Mr. Bracco today for an imaging study of the thoracic and lumbar spine, he does not have a sensory level.  I would like him to have an MRI as soon as possible.  In addition I will prescribe him a muscle relaxant and a pain pill to help him overcome this pain - he may need additional physical therapy, and can follow Dr. Ethelene Hal .  2) RLS: may be related to spinal abnormalities, no neuropathy, low iron. Once the acute pain is improved will work up further.   3) ADD/ ADHD , will refill Concerta.   4) clinical depression - major .    The patient was advised of the nature of the diagnosed disorder , the treatment options and the  risks for general health and wellness arising from not treating the condition.   I spent more than 50 minutes of face to face time with the patient.  Greater than 50% of time was spent in counseling and coordination of care. We have discussed the diagnosis and differential and I answered the patient's questions.    Plan:  Treatment plan and additional workup :  Rv in 2-3 weeks.    No orders of the defined types were placed in this encounter.    No orders of the defined types were placed in this encounter.    No follow-ups on  file.   Melvyn Novas, MD 06/02/2018, 1:17 PM  Certified in Neurology by ABPN Certified in Sleep Medicine by [facility] [address] [place], [place] [address]

## 2018-06-02 NOTE — Addendum Note (Signed)
Addended by: Melvyn Novas on: 06/02/2018 02:34 PM   Modules accepted: Orders

## 2018-06-02 NOTE — Addendum Note (Signed)
Addended by: Melvyn Novas on: 06/02/2018 02:56 PM   Modules accepted: Orders

## 2018-06-03 DIAGNOSIS — M5136 Other intervertebral disc degeneration, lumbar region: Secondary | ICD-10-CM | POA: Diagnosis not present

## 2018-06-03 DIAGNOSIS — M4315 Spondylolisthesis, thoracolumbar region: Secondary | ICD-10-CM | POA: Diagnosis not present

## 2018-06-03 DIAGNOSIS — M5124 Other intervertebral disc displacement, thoracic region: Secondary | ICD-10-CM | POA: Diagnosis not present

## 2018-06-03 DIAGNOSIS — M47896 Other spondylosis, lumbar region: Secondary | ICD-10-CM | POA: Diagnosis not present

## 2018-06-03 DIAGNOSIS — M47894 Other spondylosis, thoracic region: Secondary | ICD-10-CM | POA: Diagnosis not present

## 2018-06-03 DIAGNOSIS — M5137 Other intervertebral disc degeneration, lumbosacral region: Secondary | ICD-10-CM | POA: Diagnosis not present

## 2018-06-03 DIAGNOSIS — M5030 Other cervical disc degeneration, unspecified cervical region: Secondary | ICD-10-CM | POA: Diagnosis not present

## 2018-06-03 DIAGNOSIS — G4701 Insomnia due to medical condition: Secondary | ICD-10-CM | POA: Diagnosis not present

## 2018-06-03 DIAGNOSIS — M5135 Other intervertebral disc degeneration, thoracolumbar region: Secondary | ICD-10-CM | POA: Diagnosis not present

## 2018-06-03 DIAGNOSIS — G2581 Restless legs syndrome: Secondary | ICD-10-CM | POA: Diagnosis not present

## 2018-06-03 DIAGNOSIS — M549 Dorsalgia, unspecified: Secondary | ICD-10-CM | POA: Diagnosis not present

## 2018-06-03 DIAGNOSIS — G253 Myoclonus: Secondary | ICD-10-CM | POA: Diagnosis not present

## 2018-06-03 LAB — IRON,TIBC AND FERRITIN PANEL
Ferritin: 122 ng/mL (ref 30–400)
Iron Saturation: 45 % (ref 15–55)
Iron: 151 ug/dL (ref 38–169)
Total Iron Binding Capacity: 333 ug/dL (ref 250–450)
UIBC: 182 ug/dL (ref 111–343)

## 2018-06-03 LAB — COMPREHENSIVE METABOLIC PANEL
ALT: 35 IU/L (ref 0–44)
AST: 28 IU/L (ref 0–40)
Albumin/Globulin Ratio: 2.4 — ABNORMAL HIGH (ref 1.2–2.2)
Albumin: 4.8 g/dL (ref 3.6–4.8)
Alkaline Phosphatase: 57 IU/L (ref 39–117)
BUN/Creatinine Ratio: 11 (ref 10–24)
BUN: 15 mg/dL (ref 8–27)
Bilirubin Total: 0.7 mg/dL (ref 0.0–1.2)
CO2: 24 mmol/L (ref 20–29)
Calcium: 10.2 mg/dL (ref 8.6–10.2)
Chloride: 101 mmol/L (ref 96–106)
Creatinine, Ser: 1.31 mg/dL — ABNORMAL HIGH (ref 0.76–1.27)
GFR calc Af Amer: 68 mL/min/{1.73_m2} (ref >59)
GFR calc non Af Amer: 59 mL/min/{1.73_m2} — ABNORMAL LOW (ref >59)
Globulin, Total: 2 g/dL (ref 1.5–4.5)
Glucose: 81 mg/dL (ref 65–99)
Potassium: 4.4 mmol/L (ref 3.5–5.2)
Sodium: 142 mmol/L (ref 134–144)
Total Protein: 6.8 g/dL (ref 6.0–8.5)

## 2018-06-06 ENCOUNTER — Telehealth: Payer: Self-pay | Admitting: Neurology

## 2018-06-06 DIAGNOSIS — M5030 Other cervical disc degeneration, unspecified cervical region: Secondary | ICD-10-CM

## 2018-06-06 DIAGNOSIS — M549 Dorsalgia, unspecified: Secondary | ICD-10-CM

## 2018-06-06 NOTE — Telephone Encounter (Signed)
Called the patient and reviewed the results of the MRI thoarcic spine and lumbar area. Informed the patient that he had significant degenerative disk disease throughout the back. Informed the pt there was left foraminal stenosis as well in the L5-S1. There were no acute fractures. Informed the patient that Dr Vickey Huger recommends the pt start PT to help with stretching and working his back. Patient has requested the order be sent to [facility] rehab. Order placed. Order also placed for the patient to [facility] to assess the need for surgery or what interventions they would offer for the patient. Pt verbalized understanding. Pt had no questions at this time but was encouraged to call back if questions arise.

## 2018-06-07 ENCOUNTER — Telehealth: Payer: Self-pay | Admitting: Neurology

## 2018-06-07 NOTE — Telephone Encounter (Signed)
Sent to [facility] Telephone  [phone] - fax [phone] . I have talked to patient and he has requested me mail MRI CD to his Home and he will take with him to his apt.  Telephone [phone] - fax [phone]

## 2018-06-14 NOTE — Telephone Encounter (Signed)
Patient is scheduled with Dr. Conchita Paris 06/22/2018 arrive at 12:30 for 1:00 . Patient is aware.

## 2018-06-16 ENCOUNTER — Encounter: Payer: Self-pay | Admitting: Neurology

## 2018-06-16 ENCOUNTER — Ambulatory Visit: Payer: BLUE CROSS/BLUE SHIELD | Admitting: Neurology

## 2018-06-16 VITALS — BP 129/79 | HR 87 | Ht 72.0 in | Wt 192.0 lb

## 2018-06-16 DIAGNOSIS — M5030 Other cervical disc degeneration, unspecified cervical region: Secondary | ICD-10-CM | POA: Diagnosis not present

## 2018-06-16 DIAGNOSIS — F5104 Psychophysiologic insomnia: Secondary | ICD-10-CM

## 2018-06-16 DIAGNOSIS — G2581 Restless legs syndrome: Secondary | ICD-10-CM

## 2018-06-16 DIAGNOSIS — R0683 Snoring: Secondary | ICD-10-CM

## 2018-06-16 DIAGNOSIS — Z1389 Encounter for screening for other disorder: Secondary | ICD-10-CM

## 2018-06-16 DIAGNOSIS — F909 Attention-deficit hyperactivity disorder, unspecified type: Secondary | ICD-10-CM

## 2018-06-16 DIAGNOSIS — Z1339 Encounter for screening examination for other mental health and behavioral disorders: Secondary | ICD-10-CM

## 2018-06-16 MED ORDER — CITALOPRAM HYDROBROMIDE 10 MG PO TABS: 10.0000 mg | ORAL_TABLET | Freq: Every day | ORAL | 2 refills | Status: DC

## 2018-06-16 MED ORDER — DIAZEPAM 5 MG PO TABS: 5.0000 mg | ORAL_TABLET | Freq: Four times a day (QID) | ORAL | 0 refills | Status: DC | PRN

## 2018-06-16 MED ORDER — CYCLOBENZAPRINE HCL 10 MG PO TABS: 10.0000 mg | ORAL_TABLET | Freq: Every day | ORAL | 0 refills | Status: DC

## 2018-06-16 NOTE — Progress Notes (Signed)
SLEEP MEDICINE CLINIC   Provider:  Melvyn Novas, [place]  Primary Care Physician:  Ronnald Nian, MD   Referring Provider: Dr.  Susann Givens.     Chief Complaint  Patient presents with  . Pain    rm 10, FU to MRI    HPI:  Parker Silva is a 62 y.o. male patient seen today in a revisit from 16 June 2018. Parker Silva who presented with severe back spasms the last time he was here had meanwhile undergone an MRI of the lumbar spine at [facility], and it showed that there was some medial disc prolapse as well as a lateral L5-S1 left-sided foraminal stenosis.  He is doing much better today than he did on his last visit, Valium has helped to reduce the back spasms, I also prescribed Flexeril for him which helped with sleep as well. I have also referred to PT - starting on Monday_  and to neurosurgery- hopefully no surgical intervention is needed, but Dr Ollen Bowl can help design a pain treatment or intervention. I like to have the patient being known to a neurosurgeon in case his condition requires surgery.  He is still sore, but not having the shooting pain at this time, following diazepam and Flexaril.  He sleeps not better- he goes to bed by midnight - waking with 2 hours- mind is racing- he feels hungry, eats and goes to bed again from 2 AM to about 7 AM and rises very  "groggy" . He is snoring but has not been told that he pauses his breath. insomnia is a mix of depression, fatigue with paradox insomnia, and pain. He has a panic attack in the MRI - he has claustrophobia.    First seen on 06-02-2018 in a referral from Dr. Susann Givens for RLS and insomnia. Chief complaint according to patient :RLS. Has back pain, and today has severe shooting radiculopathic pain.  He is fatigued, can hardly sleep and is very ,very sleepy, drained of energy. He aches to sleep restfully.   He reports having frequent sharp headaches, ice pick type, all his life- they turned in migraines over 20 years ago. Uses  Maxalt. Has occipital tension and neck pain.  Anxiety, social discomfort with logasthenia/ dyslexia. Has had a lot of head injuries [place], had struggles anyway  With ADD/ AHD and learning disability.Has back pain, and today has severe shooting radiculopathic pain.  Dr. Ethelene Hal has treated him with " shots ' to the cervical spine. He is fatigued, can hardly sleep and is very ,very sleepy, drained of energy. He aches to sleep restfully.     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  He tries to distract himself watching TV-  He feels he is light sleep. He goes to the bedroom, watches Netflix -  RLS affect his sleep latency, he jumps, his limbs jerk, and he finally falls asleep before midnight. The Tv has a timer, bed- room is cool, quiet and dark. He dreams. He wakes up at 2 AM, restless legs, walks around, leaves the house, goes to the garden. He may be able to get to sleep about 2 hours later, rubs his legs, takes magnesium. Sleeping another 3-4 hours , He wakes with difficulties at 8-9 AM , the best sleep in the morning. the rising sun already penetrating the room. No Naps.    Sleep  medical history and family sleep history: mother and MGM had restless legs.   Social history: single, raised on a farm. Non smoker, non ETOH, caffeine- iced tea in AM , latest 11 AM. 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  Lives alone, partner has his own place. One dog.    Review of Systems: Out of a complete 14 system review, the patient complains of only the following symptoms, and all other reviewed systems are negative.  Epworth score 10/ 24  , Fatigue severity score 56/ 63 , depression score 5/ 15    Social History   Socioeconomic History  . Marital status: Single    Spouse name: Not on file  . Number of children: 0  . Years of education: Not on file  . Highest education level: Not on file    Occupational History  . Occupation: owns Teacher, early years/pre: [employer]  Social Needs  . Financial resource strain: Not on file  . Food insecurity:    Worry: Not on file    Inability: Not on file  . Transportation needs:    Medical: Not on file    Non-medical: Not on file  Tobacco Use  . Smoking status: Never Smoker  . Smokeless tobacco: Never Used  Substance and Sexual Activity  . Alcohol use: Yes    Comment: no  . Drug use: No  . Sexual activity: Not Currently  Lifestyle  . Physical activity:    Days per week: Not on file    Minutes per session: Not on file  . Stress: Not on file  Relationships  . Social connections:    Talks on phone: Not on file    Gets together: Not on file    Attends religious service: Not on file    Active member of club or organization: Not on file    Attends meetings of clubs or organizations: Not on file    Relationship status: Not on file  . Intimate partner violence:    Fear of current or ex partner: Not on file    Emotionally abused: Not on file    Physically abused: Not on file    Forced sexual activity: Not on file  Other Topics Concern  . Not on file  Social History Narrative   Lives alone in a one story home in [place].    Family History  Problem Relation Age of Onset  . Breast cancer Mother   . Diabetes type II Father   . Heart failure Father   . COPD Father   . Colon cancer Neg Hx   . Rectal cancer Neg Hx   . Stomach cancer Neg Hx     Past Medical History:  Diagnosis Date  . ADHD (attention deficit hyperactivity disorder)   . Allergy    RHINITIS-seasonal  . Anal fissure   . Arthritis    back,neck  . Dyslexia   . ED (erectile dysfunction)   . Hemorrhoids   . Hemorrhoids, internal, with bleeding 02/13/2015   Grade 2 hemorrhoids 02/13/15 band ligation right posterior hemorrhoidal bundle 03/04/15 band ligation right anterior hemorrhoidal bundle 04/15/15 band ligation left lateral hemorrhoidal  bundle   . History of migraine headaches   . Sleep disturbance   . Tension headache     Past Surgical History:  Procedure Laterality Date  . APPENDECTOMY    . COLONOSCOPY  2006  . HEMORRHOID BANDING    . TONSILLECTOMY  Current Outpatient Medications  Medication Sig Dispense Refill  . buPROPion (WELLBUTRIN SR) 150 MG 12 hr tablet TAKE 1 TABLET ONCE DAILY. 90 tablet 3  . Cholecalciferol (VITAMIN D3) 5000 units TABS Take 1 tablet by mouth 2 (two) times daily.    Marland Kitchen FEVERFEW PO Take 2 tablets by mouth daily.    . flurbiprofen (ANSAID) 50 MG tablet Take 50 mg by mouth as needed.    . fluticasone (FLONASE) 50 MCG/ACT nasal spray USE 2 SPRAYS IN EACH NOSTRIL ONCE DAILY 16 g 11  . levocetirizine (XYZAL ALLERGY 24HR) 5 MG tablet Take 5 mg by mouth every evening.    . Magnesium 500 MG TABS Take 1 tablet by mouth daily.    . Menaquinone-7 (VITAMIN K2 PO) Take 150 mg by mouth daily.    . methylphenidate (CONCERTA) 36 MG PO CR tablet Take 1 tablet (36 mg total) by mouth daily. 30 tablet 0  . Methylsulfonylmethane (MSM PO) Take 1,000 mg by mouth 4 (four) times daily.    . Multiple Vitamins-Minerals (MULTIVITAMIN WITH MINERALS) tablet Take 1 tablet by mouth daily. Reported on 03/04/2016    . Omega-3 Fatty Acids (SUPER OMEGA 3 PO) Take 4 capsules by mouth daily.    Marland Kitchen OVER THE COUNTER MEDICATION 2 tablets daily. Florassust mood    . OVER THE COUNTER MEDICATION 100 mg 3 (three) times daily. Super Ubiquinol    . Prasterone, DHEA, (DHEA 50 PO) Take 1 tablet by mouth daily.    . rizatriptan (MAXALT) 10 MG tablet Take 10 mg by mouth as needed (not had in 1 year as of 01-23-2015). May repeat in 2 hours if needed    . sildenafil (REVATIO) 20 MG tablet TAKE UP TO 5 TABLETS DAILY AS NEEDED. 20 tablet 0  . thyroid (ARMOUR) 30 MG tablet Take 30 mg by mouth daily before breakfast.    . topiramate (TOPAMAX) 25 MG capsule Take 25 mg by mouth daily.     . traZODone (DESYREL) 100 MG tablet TAKE 3 TABLETS AT  BEDTIME. 90 tablet 0  . vitamin A [address] UNIT capsule Take 10,000 Units by mouth daily.    . diazepam (VALIUM) 5 MG tablet Take 1 tablet (5 mg total) by mouth every 6 (six) hours as needed for muscle spasms. (Patient not taking: Reported on 06/16/2018) 30 tablet 0  . fexofenadine (ALLEGRA) 30 MG tablet Take 30 mg by mouth 2 (two) times daily.    Marland Kitchen testosterone cypionate (DEPOTESTOTERONE CYPIONATE) 100 MG/ML injection Inject into the muscle once a week. For IM use only     No current facility-administered medications for this visit.     Allergies as of 06/16/2018  . (No Known Allergies)    Vitals: BP 129/79   Pulse 87   Ht 6' (1.829 m)   Wt 192 lb (87.1 kg)   BMI 26.04 kg/m  Last Weight:  Wt Readings from Last 1 Encounters:  06/16/18 192 lb (87.1 kg)   UEA:VWUJ mass index is 26.04 kg/m.     Last Height:   Ht Readings from Last 1 Encounters:  06/16/18 6' (1.829 m)    Physical exam:  General: The patient is awake, alert and appears not in acute distress. The patient is well groomed. Head: Normocephalic, atraumatic. Neck is supple. Mallampati 3,  neck circumference:16. Nasal airflow congested , TMJ-no click  evident . Retrognathia is not seen.  Cardiovascular:  Regular rate and rhythm , without  murmurs or carotid bruit, and without distended neck veins.  Respiratory: Lungs are clear to auscultation. Skin:  Without evidence of edema, or rash Trunk: BMI is 25.77. The patient's posture is affected by back pain   Neurologic exam : The patient is awake and alert, oriented to place and time.     Attention span & concentration ability appears normal.  Speech is fluent,  without  dysarthria, dysphonia or aphasia.  Mood and affect are appropriate.  Cranial nerves: Pupils are equal and briskly reactive to light. Funduscopic exam without  evidence of pallor or edema. Extraocular movements  in vertical and horizontal planes intact and without nystagmus. Visual fields by finger perimetry  are intact. Hearing to finger rub intact.   Facial sensation intact to fine touch.  Facial motor strength is symmetric -the tongue and uvula move midline. Shoulder shrug was symmetrical.   Motor exam:   Normal tone, muscle bulk and symmetric strength in upper extremities. He can walk today, extends and flexes both legs, normal foot movements.  Sensory:  Fine touch, pinprick and vibration were tested in all extremities.  Shooting pain in the groin, lower abs.  Proprioception tested in the upper extremities was normal.  Coordination: Rapid alternating movements in the fingers/hands was normal. Finger-to-nose maneuver  normal without evidence of ataxia, dysmetria or tremor. Gait and station: Patient walks without assistive device-.   Deep tendon reflexes: in the lower extremities are  symmetric and intact. 2 plus patella, achilles 1 plus, no babinski    Assessment:  After physical and neurologic examination, review of laboratory studies,  Personal review of imaging studies, reports of other /same  Imaging studies, results of polysomnography and / or neurophysiology testing and pre-existing records as far as provided in visit., my assessment is :  Parker Silva is a 62 year old Caucasian right-handed gentleman, who was presenting today primarily to evaluate her sleep disorder.  There is a history of restless legs with strong family history, he has been suffering from insomnia, there is an overlap with depression.  All this has manifested in high fatigue and high Epworth sleepiness score . He also has nasal congestion, frequent sinusitis.  Today there is however an additional difficulty for him to go to sleep and he has developed quite severe lower back pain, stiffness in both legs, the pain shoots towards the lower abdomen and groin, and he has also abdominal pain and nausea.  He has a hard time finding a comfortable position and is seated at a sharp angle 90 degrees. No incontinence.   1)  I will refer Parker Silva today for an imaging study of the thoracic and lumbar spine, he does not have a sensory level.  I would like him to have an MRI as soon as possible.  In addition I will prescribe him a muscle relaxant and a pain pill to help him overcome this pain - he may need additional physical therapy, and can follow Dr. Ethelene Hal .  2) RLS: may be related to spinal abnormalities, no neuropathy, low iron. Once the acute pain is improved will work up further.   3) ADD/ ADHD , will refill Concerta.   4) clinical depression - major .    The patient was advised of the nature of the diagnosed insomnia disorder , the treatment options and the  risks for general health and wellness arising from not treating the condition.  I strongly feel that depression anxiety and pain culminated to this effect.   I spent more than 50 minutes of face to face time  with the patient.  Greater than 50% of time was spent in counseling and coordination of care. We have discussed the diagnosis and differential and I answered the patient's questions.    Plan:  Treatment plan and additional workup :  Refilled flexaril, Valium prn, PT referral, NS referral to assessment, may be seeing Dr Ollen Bowl.   Insomnia treatment with trazodone not longer effective. Will use lexapro in AM.    Sleep study in the near future. Rv with me.         Melvyn Novas, MD 06/16/2018, 4:56 PM  Certified in Neurology by ABPN Certified in Sleep Medicine by [facility] [address], [address] [place], [place] [address]

## 2018-06-17 ENCOUNTER — Other Ambulatory Visit: Payer: Self-pay | Admitting: Family Medicine

## 2018-06-20 ENCOUNTER — Telehealth: Payer: Self-pay | Admitting: Family Medicine

## 2018-06-20 MED ORDER — SILDENAFIL CITRATE 20 MG PO TABS: ORAL_TABLET | ORAL | 1 refills | Status: DC

## 2018-06-20 NOTE — Telephone Encounter (Signed)
Recv'd fax from [facility] regarding Trazodone that pt confused about his nightime medicine thought this was being replaced by another sleep aid ? Fax in your folder

## 2018-06-20 NOTE — Telephone Encounter (Signed)
Sildenafil was refilled at his request. He is being followed by Dr. Vickey Huger and has been referred to neurosurgery.  I discussed these findings with him.  We will keep in touch on all this.  At this point Dr. Vickey Huger will continue to work on his sleep issues, possible restless leg etc. I then discussed the fact that he is on Armour Thyroid and apparently testosterone given to him through [facility] in [place].  Discussed the fact that sometimes meds are used by that particular group for issues that are not well documented.  He plans to stop both the thyroid and the testosterone.  He will set up an appointment for blood studies in roughly 2 months.

## 2018-06-22 ENCOUNTER — Encounter: Payer: Self-pay | Admitting: Neurology

## 2018-06-22 ENCOUNTER — Telehealth: Payer: Self-pay | Admitting: Neurology

## 2018-06-22 ENCOUNTER — Other Ambulatory Visit: Payer: Self-pay | Admitting: Neurology

## 2018-06-22 DIAGNOSIS — Z6825 Body mass index (BMI) 25.0-25.9, adult: Secondary | ICD-10-CM | POA: Diagnosis not present

## 2018-06-22 DIAGNOSIS — M5441 Lumbago with sciatica, right side: Secondary | ICD-10-CM | POA: Diagnosis not present

## 2018-06-22 DIAGNOSIS — G8929 Other chronic pain: Secondary | ICD-10-CM | POA: Diagnosis not present

## 2018-06-22 NOTE — Telephone Encounter (Signed)
Pt has called in re: to the my chart message he sent for Dr Dohmeier earlier today.  Pt was informed Dr Vickey Huger has been with pt's but when she gets to the point in her day she responds to messages his will be responded to.  Pt is just asking that he receive a call back on his cell which is [phone]

## 2018-06-23 NOTE — Telephone Encounter (Signed)
IF patient calls back he was send a Clinical cytogeneticist message with Dr. Vickey Huger recommendations on yesterday.

## 2018-06-29 DIAGNOSIS — M549 Dorsalgia, unspecified: Secondary | ICD-10-CM | POA: Diagnosis not present

## 2018-06-29 DIAGNOSIS — G8929 Other chronic pain: Secondary | ICD-10-CM | POA: Diagnosis not present

## 2018-06-29 DIAGNOSIS — M542 Cervicalgia: Secondary | ICD-10-CM | POA: Diagnosis not present

## 2018-06-29 DIAGNOSIS — M5030 Other cervical disc degeneration, unspecified cervical region: Secondary | ICD-10-CM | POA: Diagnosis not present

## 2018-06-29 DIAGNOSIS — M5441 Lumbago with sciatica, right side: Secondary | ICD-10-CM | POA: Diagnosis not present

## 2018-07-01 DIAGNOSIS — M5030 Other cervical disc degeneration, unspecified cervical region: Secondary | ICD-10-CM | POA: Diagnosis not present

## 2018-07-01 DIAGNOSIS — M549 Dorsalgia, unspecified: Secondary | ICD-10-CM | POA: Diagnosis not present

## 2018-07-05 MED ORDER — CITALOPRAM HYDROBROMIDE 10 MG PO TABS: 10.0000 mg | ORAL_TABLET | Freq: Every day | ORAL | 2 refills | Status: DC

## 2018-07-05 NOTE — Telephone Encounter (Signed)
Pt called stating that medications Lexapro and Flexaril are making him "groggy" throughout the day. Requesting a call back to discuss changing the medication

## 2018-07-05 NOTE — Addendum Note (Signed)
Addended by: Melvyn NovasHMEIER, [place] on: 07/05/2018 05:10 PM   Modules accepted: Orders

## 2018-07-05 NOTE — Telephone Encounter (Signed)
He was started on Celexa, the less sleepy making form of lexapro.

## 2018-07-06 DIAGNOSIS — M549 Dorsalgia, unspecified: Secondary | ICD-10-CM | POA: Diagnosis not present

## 2018-07-06 DIAGNOSIS — M5030 Other cervical disc degeneration, unspecified cervical region: Secondary | ICD-10-CM | POA: Diagnosis not present

## 2018-07-06 NOTE — Telephone Encounter (Signed)
Dr Dohmeier sent the patient in a medication Celexa which can cause him to be less sleepy then lexapro. Patient can pick up at the pharmacy.

## 2018-07-06 NOTE — Telephone Encounter (Signed)
Message sent to Dr. Dohmeier. 

## 2018-07-06 NOTE — Telephone Encounter (Addendum)
Rn call patient about Dr. Vickey Silva sent in a rx of celexa to his pharmacy. PT stated he was already on celexa at [facility]. PT stated the pharmacy told him it may make him sleep too. PT stated this past weekend he took valium, flexeril, and tramadol which probably made him real drowsy. He stop taking the tramadol this weekend. He was taking celexa, flexeril,trazadone at [facility].HE was still taking  lexapro too. Rn stated a message will be sent to Dr. Vickey Silva again for review. PT verbalized understand.

## 2018-07-06 NOTE — Telephone Encounter (Signed)
Revised. 

## 2018-07-07 NOTE — Telephone Encounter (Signed)
Flexeril and Valium are meant to prevent back spasms and help with sleep, Tramadol is a true pain medication- not a chronic medication for over 2-3 weeks time.    Celexa / Lexapro can be taken at night,  can cause yawning, some sleepiness, but not severe. Chose either trazodone or Celexa at night.

## 2018-07-11 NOTE — Telephone Encounter (Signed)
Patient sent message on mychart. Pt responded with doing well on DR.Dohmeier current regimen. Pt was also sent mychart message to Dr. Vickey Hugerohmeier.

## 2018-07-13 DIAGNOSIS — M549 Dorsalgia, unspecified: Secondary | ICD-10-CM | POA: Diagnosis not present

## 2018-07-13 DIAGNOSIS — M5030 Other cervical disc degeneration, unspecified cervical region: Secondary | ICD-10-CM | POA: Diagnosis not present

## 2018-07-14 DIAGNOSIS — M542 Cervicalgia: Secondary | ICD-10-CM | POA: Diagnosis not present

## 2018-07-25 ENCOUNTER — Telehealth: Payer: Self-pay | Admitting: Neurology

## 2018-07-25 ENCOUNTER — Encounter: Payer: Self-pay | Admitting: Neurology

## 2018-07-25 ENCOUNTER — Ambulatory Visit (INDEPENDENT_AMBULATORY_CARE_PROVIDER_SITE_OTHER): Payer: BLUE CROSS/BLUE SHIELD | Admitting: Neurology

## 2018-07-25 DIAGNOSIS — G4710 Hypersomnia, unspecified: Secondary | ICD-10-CM | POA: Diagnosis not present

## 2018-07-25 DIAGNOSIS — M5030 Other cervical disc degeneration, unspecified cervical region: Secondary | ICD-10-CM

## 2018-07-25 DIAGNOSIS — R0683 Snoring: Secondary | ICD-10-CM

## 2018-07-25 DIAGNOSIS — G2581 Restless legs syndrome: Secondary | ICD-10-CM

## 2018-07-25 DIAGNOSIS — Z1389 Encounter for screening for other disorder: Secondary | ICD-10-CM

## 2018-07-25 DIAGNOSIS — F909 Attention-deficit hyperactivity disorder, unspecified type: Secondary | ICD-10-CM

## 2018-07-25 DIAGNOSIS — Z1339 Encounter for screening examination for other mental health and behavioral disorders: Secondary | ICD-10-CM

## 2018-07-25 DIAGNOSIS — F5104 Psychophysiologic insomnia: Secondary | ICD-10-CM

## 2018-07-25 MED ORDER — TRAZODONE HCL 150 MG PO TABS: 150.0000 mg | ORAL_TABLET | Freq: Every day | ORAL | 5 refills | Status: DC

## 2018-07-25 NOTE — Telephone Encounter (Signed)
Patient in the lobby requesting face to face with nurse regarding sleep meds. Here for HST.

## 2018-07-25 NOTE — Telephone Encounter (Signed)
Patient states that he needs something different to help him sleep. He is also running late today to pick up his sleep equipment because he is broken down on the side of the road. Please call and advise.

## 2018-07-25 NOTE — Telephone Encounter (Signed)
Pt was able to speak to Dr Vickey Hugerohmeier and is in need of refilling the trazadone medication. Will place the order and send for the pt.

## 2018-07-27 NOTE — Procedures (Signed)
[name], [place] [address] NAME:   Parker Silva                                                                       DOB: March 13, 1956 MEDICAL RECORD No:  [id number]                                                       DOS:  07/25/2018 REFERRING PHYSICIAN: Sharlot Gowda, MD STUDY PERFORMED: Home Sleep Test on [facility] HISTORY:  [name] is a 61 y.o. male patient seen today in a revisit from 16 June 2018.Mr. [name] who presented with severe back spasms the last time he was here had meanwhile undergone an MRI of the lumbar spine at [facility], and it showed that there was some medial disc prolapse as well as a lateral L5-S1 left-sided foraminal stenosis.  He is doing much better today, Valium has helped to reduce the back spasms, I also prescribed Flexeril for him which helped with sleep as well. I have also referred to PT - starting on Monday and to neurosurgery- hopefully no surgical intervention is needed, but Dr. Ollen Bowl may help design a pain treatment or non- surgical intervention.  He is still snoring, but not having the shooting pain at this time, following diazepam and Flexaril. He sleeps not better- he goes to bed by midnight - waking within 2 hours and finds his mind is racing- he feels hungry, he eats and goes to bed again from 2 AM to about 7 AM and rises very "groggy" at 7 AM. He is snoring but has not been told that he pauses his breath. His insomnia has likely multiple causes; such as depression, fatigue with paradox insomnia, and pain interfering with sleep. He has had a panic attack in the MRI - reports high anxiety levels and he has claustrophobia. Epworth Sleepiness score at 10/24, FSS at 56/63 points, Depression score positive, BMI was 26 kg/m2.   STUDY RESULTS:  Total Recording Time: 7 h 1 min; Valid Sleep time: 4 h 28 min only.  Total Apnea/Hypopnea Index (AHI):  2.9 /h:  RDI: 6.3 /h. Average Oxygen Saturation:  93 %; Lowest Oxygen Saturation: 88 %.  Total Time in Oxygen  Saturation below 89 %:  0.1 minute.  Average Heart Rate: regular 80 bpm (between 61 and 103 bpm). IMPRESSION: No evidence of clinically significant sleep apnea, moderate snoring, no irregular heart rate and no evidence of hypoxemia.   Overall sleep time was very short- confirming the patient's report of insomnia.  RECOMMENDATION:  Insomnia treatment is a combined approach of antianxiety treatment with PCP and behavioral health, helping with relaxation and sleep hygiene techniques and modalities- Treatment of depression. I have refilled trazodone medication for sleep initiation, will meet in a RV soon.  Pain treatment with pain/ rehab specialist.  I certify that I have reviewed the raw data recording prior to the issuance of this report in accordance with the standards of the American Academy of Sleep Medicine (AASM). Melvyn Novas, M.D.    07-27-2018    Medical Director  of [facility], accredited by the AASM. Diplomat of the ABPN and ABSM.

## 2018-07-28 ENCOUNTER — Encounter: Payer: Self-pay | Admitting: Family Medicine

## 2018-07-28 ENCOUNTER — Telehealth: Payer: Self-pay | Admitting: Neurology

## 2018-07-28 DIAGNOSIS — M5030 Other cervical disc degeneration, unspecified cervical region: Secondary | ICD-10-CM | POA: Diagnosis not present

## 2018-07-28 DIAGNOSIS — M549 Dorsalgia, unspecified: Secondary | ICD-10-CM | POA: Diagnosis not present

## 2018-07-28 MED ORDER — AMOXICILLIN-POT CLAVULANATE 875-125 MG PO TABS: 1.0000 | ORAL_TABLET | Freq: Two times a day (BID) | ORAL | 0 refills | Status: DC

## 2018-07-28 NOTE — Telephone Encounter (Signed)
Called the patient and reviewed the sleep study results with him as well as the recommendations made by Dr Vickey Hugerohmeier. Advised the patient that cognitive behavior therapy can help with insomnia treatment. Antianxiety and depression treatment is significant and should be discussed with PCP. Offered to schedule a follow up apt and at this time pt is declining and will call when he is ready to schedule.

## 2018-07-28 NOTE — Telephone Encounter (Signed)
-----   Message from Melvyn Novasarmen Dohmeier, MD sent at 07/27/2018  2:32 PM EST ----- I suspect still some pain component to interfere with Parker Silva's sleep. There was no organic reason for insomnia identified.  His apnea was too mild to warrant intervention, snoring was moderate - there a few other helpful data obtained by a HST.  Avoiding supine sleep may help to reduce the snoring volume, but makes back pain likely worse.   Insomnia treatment is a combined approach of  antianxiety treatment with PCP and behavioral health, helping  with relaxation and sleep hygiene techniques and modalities-  Treatment of depression. I have refilled trazodone medication for sleep initiation, will  meet in a RV soon.  Pain treatment with pain/ rehab specialist.

## 2018-08-11 ENCOUNTER — Other Ambulatory Visit: Payer: Self-pay | Admitting: Family Medicine

## 2018-08-11 MED ORDER — ONDANSETRON 8 MG PO TBDP: 8.0000 mg | ORAL_TABLET | Freq: Three times a day (TID) | ORAL | 0 refills | Status: DC | PRN

## 2018-08-11 NOTE — Progress Notes (Signed)
He has a 2-day history of nausea, vomiting, abdominal pain, diarrhea.  I will give him Zofran ODT and recommend Lomotil.

## 2018-08-22 ENCOUNTER — Other Ambulatory Visit: Payer: Self-pay | Admitting: Neurology

## 2018-08-22 DIAGNOSIS — F909 Attention-deficit hyperactivity disorder, unspecified type: Secondary | ICD-10-CM

## 2018-08-25 ENCOUNTER — Encounter: Payer: Self-pay | Admitting: Neurology

## 2018-08-29 DIAGNOSIS — M549 Dorsalgia, unspecified: Secondary | ICD-10-CM | POA: Diagnosis not present

## 2018-08-29 DIAGNOSIS — M5030 Other cervical disc degeneration, unspecified cervical region: Secondary | ICD-10-CM | POA: Diagnosis not present

## 2018-09-05 ENCOUNTER — Other Ambulatory Visit: Payer: Self-pay | Admitting: Family Medicine

## 2018-09-06 NOTE — Telephone Encounter (Signed)
[facility] is requesting to fill pt flurbiprofen. Please advise kh

## 2018-09-13 ENCOUNTER — Other Ambulatory Visit: Payer: Self-pay | Admitting: Neurology

## 2018-09-20 ENCOUNTER — Telehealth: Payer: Self-pay | Admitting: Family Medicine

## 2018-09-20 MED ORDER — RIZATRIPTAN BENZOATE 10 MG PO TABS: 10.0000 mg | ORAL_TABLET | ORAL | 1 refills | Status: DC | PRN

## 2018-09-20 NOTE — Telephone Encounter (Signed)
[facility] req Rizatriptan 10 mg

## 2018-09-24 ENCOUNTER — Other Ambulatory Visit: Payer: Self-pay | Admitting: Diagnostic Neuroimaging

## 2018-09-24 DIAGNOSIS — F909 Attention-deficit hyperactivity disorder, unspecified type: Secondary | ICD-10-CM

## 2018-09-26 ENCOUNTER — Other Ambulatory Visit: Payer: Self-pay | Admitting: Neurology

## 2018-09-26 ENCOUNTER — Encounter: Payer: Self-pay | Admitting: Neurology

## 2018-09-26 ENCOUNTER — Ambulatory Visit: Payer: BLUE CROSS/BLUE SHIELD | Admitting: Family Medicine

## 2018-09-26 ENCOUNTER — Telehealth: Payer: Self-pay | Admitting: Neurology

## 2018-09-26 VITALS — BP 128/86 | HR 100 | Temp 98.0°F | Wt 192.2 lb

## 2018-09-26 DIAGNOSIS — E039 Hypothyroidism, unspecified: Secondary | ICD-10-CM | POA: Diagnosis not present

## 2018-09-26 DIAGNOSIS — G2581 Restless legs syndrome: Secondary | ICD-10-CM | POA: Diagnosis not present

## 2018-09-26 DIAGNOSIS — F909 Attention-deficit hyperactivity disorder, unspecified type: Secondary | ICD-10-CM

## 2018-09-26 DIAGNOSIS — E291 Testicular hypofunction: Secondary | ICD-10-CM

## 2018-09-26 DIAGNOSIS — M5030 Other cervical disc degeneration, unspecified cervical region: Secondary | ICD-10-CM | POA: Diagnosis not present

## 2018-09-26 DIAGNOSIS — M549 Dorsalgia, unspecified: Secondary | ICD-10-CM | POA: Diagnosis not present

## 2018-09-26 DIAGNOSIS — J329 Chronic sinusitis, unspecified: Secondary | ICD-10-CM

## 2018-09-26 DIAGNOSIS — G479 Sleep disorder, unspecified: Secondary | ICD-10-CM

## 2018-09-26 MED ORDER — METHYLPHENIDATE HCL ER (OSM) 36 MG PO TBCR: 36.0000 mg | EXTENDED_RELEASE_TABLET | Freq: Every day | ORAL | 0 refills | Status: DC

## 2018-09-26 MED ORDER — AMOXICILLIN-POT CLAVULANATE 875-125 MG PO TABS: 1.0000 | ORAL_TABLET | Freq: Two times a day (BID) | ORAL | 0 refills | Status: DC

## 2018-09-26 NOTE — Progress Notes (Signed)
   Subjective:    Patient ID: Parker Silva, male    DOB: June 11, 1956, 63 y.o.   MRN: [id number]  HPI He complains of a 3-week history of difficulty with nasal congestion, PND, eye discomfort, frontal and maxillary sinus pain, sore throat.  She also states that he has hot flashes.  He does have a previous history of tension headache.  He also has a history of RLS and has had difficulty with sleep.  He is being followed by Dr. Vickey Huger for this.  He presently is on medication but states that he still cannot get a good nights rest and has been told that he no has restless legs, kicking off his bed sheets regularly. He also was seen in [place], [facility] and was placed on thyroid medication as well as testosterone.  He stopped the thyroid about a month ago and testosterone 2 weeks ago.  I do not have the previous blood work on this.  Apparently he was also placed on multiple vitamins while there as well.   Review of Systems     Objective:   Physical Exam Alert and in no distress.  Nasal mucosa is slightly red with some tenderness over frontal and maxillary sinuses.  Tympanic membranes and canals are normal. Pharyngeal area is normal. Neck is supple without adenopathy or thyromegaly. Cardiac exam shows a regular sinus rhythm without murmurs or gallops. Lungs are clear to auscultation.        Assessment & Plan:  Recurrent sinusitis - Plan: amoxicillin-clavulanate (AUGMENTIN) 875-125 MG tablet  Hypogonadism male - Plan: Testosterone  Hypothyroidism, unspecified type - Plan: TSH  RLS (restless legs syndrome)  Sleep disturbance He is to take the Augmentin and if not totally back to normal when he finishes, he will call me.  I will follow-up on the testosterone as well as thyroid as there is a real question is whether he truly had low levels of these.  He is to follow-up with Dr. Vickey Huger concerning the RLS and his sleep problem.

## 2018-09-26 NOTE — Telephone Encounter (Signed)
Patient had also Art gallery manager. I wrote to him on mychart. His reply was pretty ugly so I called the patient back to explain in detail how the process works for sending controlled substance medication. Advised the patient that he needs to let us know at least 2 days prior to when he is due to get his medication. Patient states that he was not under the impression that he would have to do that and has not done that in previous months. I informed him that in that case his pharmacy must have reached out to [place] to request the refill because monthly we have to send the request. When they send the request it fell on a Saturday and so therefore we weren't here to get and send the request in. I advised the patient that I have forwarded the request to Dr Vickey Huger and she has already sent the pharmacy for him. Advised they should have it for him. Pt states he will let me know monthly when to refill.

## 2018-09-26 NOTE — Telephone Encounter (Signed)
Pt said Dr D is refilling CONCERTA 36 MG CR tablet for him, not PCP. He has been out for the past 3 days. Please call to advise

## 2018-09-26 NOTE — Telephone Encounter (Signed)
Pt has called re: where things are on the refilling of his CONCERTA 36 MG CR tablet for .  Pt asked to speak with RN Baird Lyons who was with a pt at the time of call.  RN Baird Lyons asked that pt be made aware that Dr Vickey Huger will send to pharmacy today , unsure of time.  This was relayed to pt and he is asking for a call from Dr Vickey Huger to discuss

## 2018-09-26 NOTE — Telephone Encounter (Signed)
I have routed this request to Dr Dohmeier for review. The pt is due for the medication and [place] registry was verified.  

## 2018-09-27 DIAGNOSIS — E039 Hypothyroidism, unspecified: Secondary | ICD-10-CM | POA: Diagnosis not present

## 2018-09-27 DIAGNOSIS — E291 Testicular hypofunction: Secondary | ICD-10-CM | POA: Diagnosis not present

## 2018-09-27 NOTE — Addendum Note (Signed)
Addended by: Renelda Loma on: 09/27/2018 10:50 AM   Modules accepted: Orders

## 2018-09-28 ENCOUNTER — Encounter: Payer: Self-pay | Admitting: Family Medicine

## 2018-09-28 LAB — TSH: TSH: 3.46 u[IU]/mL (ref 0.450–4.500)

## 2018-09-28 LAB — TESTOSTERONE: Testosterone: 622 ng/dL (ref 264–916)

## 2018-10-03 ENCOUNTER — Encounter: Payer: Self-pay | Admitting: Neurology

## 2018-10-04 ENCOUNTER — Other Ambulatory Visit: Payer: Self-pay | Admitting: Neurology

## 2018-10-04 MED ORDER — ROPINIROLE HCL 0.5 MG PO TABS: 0.5000 mg | ORAL_TABLET | Freq: Every day | ORAL | 5 refills | Status: DC

## 2018-10-05 ENCOUNTER — Encounter: Payer: Self-pay | Admitting: Family Medicine

## 2018-10-05 DIAGNOSIS — J329 Chronic sinusitis, unspecified: Secondary | ICD-10-CM

## 2018-10-05 MED ORDER — AMOXICILLIN-POT CLAVULANATE 875-125 MG PO TABS: 1.0000 | ORAL_TABLET | Freq: Two times a day (BID) | ORAL | 0 refills | Status: DC

## 2018-10-17 DIAGNOSIS — M542 Cervicalgia: Secondary | ICD-10-CM | POA: Diagnosis not present

## 2018-10-20 ENCOUNTER — Encounter: Payer: Self-pay | Admitting: Neurology

## 2018-10-21 ENCOUNTER — Other Ambulatory Visit: Payer: Self-pay

## 2018-10-21 DIAGNOSIS — F909 Attention-deficit hyperactivity disorder, unspecified type: Secondary | ICD-10-CM

## 2018-10-21 MED ORDER — METHYLPHENIDATE HCL ER (OSM) 36 MG PO TBCR: 36.0000 mg | EXTENDED_RELEASE_TABLET | Freq: Every day | ORAL | 0 refills | Status: DC

## 2018-10-21 NOTE — Telephone Encounter (Signed)
Per  registry, last refill 09/26/2018 from Dr. Vickey Huger.

## 2018-10-29 ENCOUNTER — Other Ambulatory Visit: Payer: Self-pay | Admitting: Family Medicine

## 2018-10-31 DIAGNOSIS — M549 Dorsalgia, unspecified: Secondary | ICD-10-CM | POA: Diagnosis not present

## 2018-10-31 DIAGNOSIS — M5030 Other cervical disc degeneration, unspecified cervical region: Secondary | ICD-10-CM | POA: Diagnosis not present

## 2018-10-31 NOTE — Telephone Encounter (Signed)
Is this okay to refill? 

## 2018-11-18 ENCOUNTER — Other Ambulatory Visit: Payer: Self-pay | Admitting: Family Medicine

## 2018-11-18 DIAGNOSIS — F341 Dysthymic disorder: Secondary | ICD-10-CM

## 2018-11-22 ENCOUNTER — Other Ambulatory Visit: Payer: Self-pay | Admitting: Family Medicine

## 2018-11-22 DIAGNOSIS — F909 Attention-deficit hyperactivity disorder, unspecified type: Secondary | ICD-10-CM

## 2018-11-22 MED ORDER — METHYLPHENIDATE HCL ER (OSM) 36 MG PO TBCR: 36.0000 mg | EXTENDED_RELEASE_TABLET | Freq: Every day | ORAL | 0 refills | Status: DC

## 2018-11-22 NOTE — Telephone Encounter (Signed)
[place] is requesting to fill pt concerta. Please advise [facility] 

## 2018-12-01 ENCOUNTER — Other Ambulatory Visit: Payer: Self-pay | Admitting: Neurology

## 2018-12-12 DIAGNOSIS — Z0279 Encounter for issue of other medical certificate: Secondary | ICD-10-CM

## 2018-12-23 ENCOUNTER — Telehealth: Payer: Self-pay | Admitting: Neurology

## 2019-01-09 ENCOUNTER — Encounter: Payer: Self-pay | Admitting: Neurology

## 2019-01-09 NOTE — Telephone Encounter (Signed)
Dear Parker Silva,  I am sorry to hear that the spinal myolconus is still waking you up  multiple times each night.  I will refill meds for now and hopefully can see you face to face in the next 4 weeks, my Nurse , Baird Lyons, will make arrangements.

## 2019-01-10 ENCOUNTER — Encounter: Payer: Self-pay | Admitting: Adult Health

## 2019-01-10 NOTE — Telephone Encounter (Signed)
Call transferred to me from [facility].  Pt needs appt with MM/NP VV doxy.me consent given. Due to current COVID 19 pandemic, our office is severely reducing in office visits until further notice, in order to minimize the risk to our patients and healthcare providers.  Pt understands that although there may be some limitations with this type of visit, we will take all precautions to reduce any security or privacy concerns.  Pt understands that this will be treated like an in office visit and we will file with pt's insurance, and there may be a patient responsible charge related to this service. Appt made with MM/NP 01-11-19 at 1030.  email [email or web address].  Email sent.  Bounced back to me, LMVM for pt that email did not go thru.  Will touch base with him about 1245.

## 2019-01-11 ENCOUNTER — Other Ambulatory Visit: Payer: Self-pay | Admitting: Neurology

## 2019-01-11 ENCOUNTER — Other Ambulatory Visit: Payer: Self-pay

## 2019-01-11 ENCOUNTER — Ambulatory Visit (INDEPENDENT_AMBULATORY_CARE_PROVIDER_SITE_OTHER): Payer: BLUE CROSS/BLUE SHIELD | Admitting: Adult Health

## 2019-01-11 ENCOUNTER — Encounter: Payer: Self-pay | Admitting: Adult Health

## 2019-01-11 DIAGNOSIS — M545 Low back pain, unspecified: Secondary | ICD-10-CM

## 2019-01-11 DIAGNOSIS — G2581 Restless legs syndrome: Secondary | ICD-10-CM | POA: Diagnosis not present

## 2019-01-11 MED ORDER — GABAPENTIN 100 MG PO CAPS: 100.0000 mg | ORAL_CAPSULE | Freq: Every day | ORAL | 5 refills | Status: DC

## 2019-01-11 NOTE — Patient Instructions (Signed)
Gabapentin capsules or tablets What is this medicine? GABAPENTIN (GA ba pen tin) is used to control seizures in certain types of epilepsy. It is also used to treat certain types of nerve pain. This medicine may be used for other purposes; ask your health care provider or pharmacist if you have questions. COMMON BRAND NAME(S): Active-PAC with Gabapentin, Gabarone, Neurontin What should I tell my health care provider before I take this medicine? They need to know if you have any of these conditions: -kidney disease -suicidal thoughts, plans, or attempt; a previous suicide attempt by you or a family member -an unusual or allergic reaction to gabapentin, other medicines, foods, dyes, or preservatives -pregnant or trying to get pregnant -breast-feeding How should I use this medicine? Take this medicine by mouth with a glass of water. Follow the directions on the prescription label. You can take it with or without food. If it upsets your stomach, take it with food. Take your medicine at regular intervals. Do not take it more often than directed. Do not stop taking except on your doctor's advice. If you are directed to break the 600 or 800 mg tablets in half as part of your dose, the extra half tablet should be used for the next dose. If you have not used the extra half tablet within 28 days, it should be thrown away. A special MedGuide will be given to you by the pharmacist with each prescription and refill. Be sure to read this information carefully each time. Talk to your pediatrician regarding the use of this medicine in children. While this drug may be prescribed for children as young as 3 years for selected conditions, precautions do apply. Overdosage: If you think you have taken too much of this medicine contact a poison control center or emergency room at once. NOTE: This medicine is only for you. Do not share this medicine with others. What if I miss a dose? If you miss a dose, take it as soon  as you can. If it is almost time for your next dose, take only that dose. Do not take double or extra doses. What may interact with this medicine? Do not take this medicine with any of the following medications: -other gabapentin products This medicine may also interact with the following medications: -alcohol -antacids -antihistamines for allergy, cough and cold -certain medicines for anxiety or sleep -certain medicines for depression or psychotic disturbances -homatropine; hydrocodone -naproxen -narcotic medicines (opiates) for pain -phenothiazines like chlorpromazine, mesoridazine, prochlorperazine, thioridazine This list may not describe all possible interactions. Give your health care provider a list of all the medicines, herbs, non-prescription drugs, or dietary supplements you use. Also tell them if you smoke, drink alcohol, or use illegal drugs. Some items may interact with your medicine. What should I watch for while using this medicine? Visit your doctor or health care professional for regular checks on your progress. You may want to keep a record at home of how you feel your condition is responding to treatment. You may want to share this information with your doctor or health care professional at each visit. You should contact your doctor or health care professional if your seizures get worse or if you have any new types of seizures. Do not stop taking this medicine or any of your seizure medicines unless instructed by your doctor or health care professional. Stopping your medicine suddenly can increase your seizures or their severity. Wear a medical identification bracelet or chain if you are taking this medicine for   seizures, and carry a card that lists all your medications. You may get drowsy, dizzy, or have blurred vision. Do not drive, use machinery, or do anything that needs mental alertness until you know how this medicine affects you. To reduce dizzy or fainting spells, do not  sit or stand up quickly, especially if you are an older patient. Alcohol can increase drowsiness and dizziness. Avoid alcoholic drinks. Your mouth may get dry. Chewing sugarless gum or sucking hard candy, and drinking plenty of water will help. The use of this medicine may increase the chance of suicidal thoughts or actions. Pay special attention to how you are responding while on this medicine. Any worsening of mood, or thoughts of suicide or dying should be reported to your health care professional right away. Women who become pregnant while using this medicine may enroll in the [place] American Antiepileptic Drug Pregnancy Registry by calling 1-888-233-2334. This registry collects information about the safety of antiepileptic drug use during pregnancy. What side effects may I notice from receiving this medicine? Side effects that you should report to your doctor or health care professional as soon as possible: -allergic reactions like skin rash, itching or hives, swelling of the face, lips, or tongue -worsening of mood, thoughts or actions of suicide or dying Side effects that usually do not require medical attention (report to your doctor or health care professional if they continue or are bothersome): -constipation -difficulty walking or controlling muscle movements -dizziness -nausea -slurred speech -tiredness -tremors -weight gain This list may not describe all possible side effects. Call your doctor for medical advice about side effects. You may report side effects to FDA at 1-800-FDA-1088. Where should I keep my medicine? Keep out of reach of children. This medicine may cause accidental overdose and death if it taken by other adults, children, or pets. Mix any unused medicine with a substance like cat litter or coffee grounds. Then throw the medicine away in a sealed container like a sealed bag or a coffee can with a lid. Do not use the medicine after the expiration date. Store at room  temperature between 15 and 30 degrees C (59 and 86 degrees F). NOTE: This sheet is a summary. It may not cover all possible information. If you have questions about this medicine, talk to your doctor, pharmacist, or health care provider.  2019 Elsevier/Gold Standard (2018-01-13 13:21:44)  

## 2019-01-11 NOTE — Progress Notes (Signed)
PATIENT: MUREL SHENBERGER DOB: Jun 11, 1956  REASON FOR VISIT: follow up HISTORY FROM: patient  Virtual Visit via Video Note  I connected with Renee Rival on 01/11/19 at 10:30 AM EDT by a video enabled telemedicine application located remotely at [facility] and verified that I am speaking with the correct person using two identifiers who was located at their own home.   I discussed the limitations of evaluation and management by telemedicine and the availability of in person appointments. The patient expressed understanding and agreed to proceed.   PATIENT: Renee Rival DOB: 11-23-1955  REASON FOR VISIT: follow up HISTORY FROM: patient  HISTORY OF PRESENT ILLNESS: Today 01/11/19:  Mr. Spiker is a 63 year old male with a history of low back pain and restless leg syndrome.  He returns today for a virtual visit.  The patient states that his back pain has stabilized.  He states that he continues to have trouble with his restless legs.  Reports that he wakes up approximately 4 times during the night.  He states that his legs will jerk and knock the sheets off his bed.  He states he typically has to get up and walk in order to get some relief.  He states that during the day if he is sitting he tapping his toes however his symptoms during the day are not as severe as at night.  He states he has also noticed that since he started Requip he has been snacking a lot.  Reports that he has gained approximately 12 to 15 pounds since he started this medication.  He continues to take Flexeril 10 mg at bedtime.  He is also on Concerta prescribed by Dr. Vickey Huger.  He joins me today for a virtual visit.  HISTORY (Copied from Dr.Dohmeier's note) Ransom L Bayus is a 63 y.o. male patient seen today in a revisit from 16 June 2018. Mr. [name] who presented with severe back spasms the last time he was here had meanwhile undergone an MRI of the lumbar spine at [facility],  and it showed that there was some medial disc prolapse as well as a lateral L5-S1 left-sided foraminal stenosis.  He is doing much better today than he did on his last visit, Valium has helped to reduce the back spasms, I also prescribed Flexeril for him which helped with sleep as well. I have also referred to PT - starting on Monday_  and to neurosurgery- hopefully no surgical intervention is needed, but Dr Ollen Bowl can help design a pain treatment or intervention. I like to have the patient being known to a neurosurgeon in case his condition requires surgery.  He is still sore, but not having the shooting pain at this time, following diazepam and Flexaril.  He sleeps not better- he goes to bed by midnight - waking with 2 hours- mind is racing- he feels hungry, eats and goes to bed again from 2 AM to about 7 AM and rises very  "groggy" . He is snoring but has not been told that he pauses his breath. insomnia is a mix of depression, fatigue with paradox insomnia, and pain. He has a panic attack in the MRI - he has claustrophobia.   REVIEW OF SYSTEMS: Out of a complete 14 system review of symptoms, the patient complains only of the following symptoms, and all other reviewed systems are negative.  See HPI  ALLERGIES: No Known Allergies  HOME MEDICATIONS: Outpatient Medications Prior to Visit  Medication Sig  Dispense Refill  . amoxicillin-clavulanate (AUGMENTIN) 875-125 MG tablet Take 1 tablet by mouth 2 (two) times daily. 20 tablet 0  . ARMOUR THYROID 30 MG tablet BREAK 1 TABLET WITH TEETH AND DISSOLVE UNDER TONGUE IN MORNING DAILY.MAY EAT IN 15 MINUTES (Patient not taking: Reported on 09/26/2018) 30 tablet 0  . buPROPion (WELLBUTRIN SR) 150 MG 12 hr tablet TAKE 1 TABLET ONCE DAILY. 90 tablet 0  . Cholecalciferol (VITAMIN D3) 5000 units TABS Take 1 tablet by mouth 2 (two) times daily.    Melene Muller ON 01/21/2019] CONCERTA 36 MG CR tablet TAKE 1 TABLET ONCE DAILY. 31 tablet 0  . cyclobenzaprine (FLEXERIL)  10 MG tablet TAKE ONE TABLET AT BEDTIME. 90 tablet 0  . diazepam (VALIUM) 5 MG tablet Take 1 tablet (5 mg total) by mouth every 6 (six) hours as needed for muscle spasms. (Patient not taking: Reported on 09/26/2018) 30 tablet 0  . FEVERFEW PO Take 2 tablets by mouth daily.    . fexofenadine (ALLEGRA) 30 MG tablet Take 30 mg by mouth 2 (two) times daily.    . flurbiprofen (ANSAID) 100 MG tablet TAKE 1 TABLET TWICE DAILY AS NEEDED FOR HEADACHE. NO MORE THAN 4 DAYS/WEEK. 40 tablet 0  . fluticasone (FLONASE) 50 MCG/ACT nasal spray USE 2 SPRAYS IN EACH NOSTRIL ONCE DAILY 16 g 11  . levocetirizine (XYZAL ALLERGY 24HR) 5 MG tablet Take 5 mg by mouth every evening.    . Magnesium 500 MG TABS Take 1 tablet by mouth daily.    . Menaquinone-7 (VITAMIN K2 PO) Take 150 mg by mouth daily.    . methylphenidate (CONCERTA) 36 MG PO CR tablet Take 1 tablet (36 mg total) by mouth daily. 31 tablet 0  . methylphenidate (CONCERTA) 36 MG PO CR tablet Take 1 tablet (36 mg total) by mouth daily. 31 tablet 0  . Methylsulfonylmethane (MSM PO) Take 1,000 mg by mouth 4 (four) times daily.    . Multiple Vitamins-Minerals (MULTIVITAMIN WITH MINERALS) tablet Take 1 tablet by mouth daily. Reported on 03/04/2016    . Omega-3 Fatty Acids (SUPER OMEGA 3 PO) Take 4 capsules by mouth daily.    . ondansetron (ZOFRAN-ODT) 8 MG disintegrating tablet Take 1 tablet (8 mg total) by mouth every 8 (eight) hours as needed for nausea or vomiting. (Patient not taking: Reported on 09/26/2018) 10 tablet 0  . OVER THE COUNTER MEDICATION 2 tablets daily. Florassust mood    . OVER THE COUNTER MEDICATION 100 mg 3 (three) times daily. Super Ubiquinol    . Prasterone, DHEA, (DHEA 50 PO) Take 1 tablet by mouth daily.    . rizatriptan (MAXALT) 10 MG tablet Take 1 tablet (10 mg total) by mouth as needed (not had in 1 year as of 01-23-2015). May repeat in 2 hours if needed 10 tablet 1  . rOPINIRole (REQUIP) 0.5 MG tablet Take 1 tablet (0.5 mg total) by mouth at  bedtime. 30 tablet 5  . sildenafil (REVATIO) 20 MG tablet TAKE UP TO 5 TABLETS DAILY AS NEEDED. 20 tablet 1  . testosterone cypionate (DEPOTESTOTERONE CYPIONATE) 100 MG/ML injection Inject into the muscle once a week. For IM use only    . topiramate (TOPAMAX) 25 MG capsule Take 25 mg by mouth daily.     . traZODone (DESYREL) 150 MG tablet Take 1 tablet (150 mg total) by mouth at bedtime. 30 tablet 5  . vitamin A 54270 UNIT capsule Take 10,000 Units by mouth daily.     No facility-administered  medications prior to visit.     PAST MEDICAL HISTORY: Past Medical History:  Diagnosis Date  . ADHD (attention deficit hyperactivity disorder)   . Allergy    RHINITIS-seasonal  . Anal fissure   . Arthritis    back,neck  . Dyslexia   . ED (erectile dysfunction)   . Hemorrhoids   . Hemorrhoids, internal, with bleeding 02/13/2015   Grade 2 hemorrhoids 02/13/15 band ligation right posterior hemorrhoidal bundle 03/04/15 band ligation right anterior hemorrhoidal bundle 04/15/15 band ligation left lateral hemorrhoidal bundle   . History of migraine headaches   . Sleep disturbance   . Tension headache     PAST SURGICAL HISTORY: Past Surgical History:  Procedure Laterality Date  . APPENDECTOMY    . COLONOSCOPY  2006  . HEMORRHOID BANDING    . TONSILLECTOMY      FAMILY HISTORY: Family History  Problem Relation Age of Onset  . Breast cancer Mother   . Diabetes type II Father   . Heart failure Father   . COPD Father   . Colon cancer Neg Hx   . Rectal cancer Neg Hx   . Stomach cancer Neg Hx     SOCIAL HISTORY: Social History   Socioeconomic History  . Marital status: Single    Spouse name: Not on file  . Number of children: 0  . Years of education: Not on file  . Highest education level: Not on file  Occupational History  . 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 Financial resource strain: Not on file  . Food insecurity:     Worry: Not on file    Inability: Not on file  . Transportation needs:    Medical: Not on file    Non-medical: Not on file  Tobacco Use  . Smoking status: Never Smoker  . Smokeless tobacco: Never Used  Substance and Sexual Activity  . Alcohol use: Yes    Comment: no  . Drug use: No  . Sexual activity: Not Currently  Lifestyle  . Physical activity:    Days per week: Not on file    Minutes per session: Not on file  . Stress: Not on file  Relationships  . Social connections:    Talks on phone: Not on file    Gets together: Not on file    Attends religious service: Not on file    Active member of club or organization: Not on file    Attends meetings of clubs or organizations: Not on file    Relationship status: Not on file  . Intimate partner violence:    Fear of current or ex partner: Not on file    Emotionally abused: Not on file    Physically abused: Not on file    Forced sexual activity: Not on file  Other Topics Concern  . Not on file  Social History Narrative   [place]      PHYSICAL EXAM Generalized: Well developed, in no acute distress   Neurological examination  Mentation: Alert oriented to time, place, history taking. Follows all commands speech and language fluent Cranial nerve II-XII:Extraocular movements were full. Facial symmetry noted. uvula tongue midline. Head turning and shoulder shrug  were normal and symmetric. Motor: Good strength throughout subjectively per patient Sensory: Sensory testing is intact to soft touch on all 4 extremities subjectively per patient Coordination: Cerebellar testing reveals good finger-nose-finger  Gait and  station: Patient is able to stand from a seated position. gait is normal.  Reflexes: UTA  DIAGNOSTIC DATA (LABS, IMAGING, TESTING) - I reviewed patient records, labs, notes, testing and imaging myself where available.  Lab Results  Component Value Date   WBC 4.9 11/13/2016   HGB  16.8 11/13/2016   HCT 47.8 11/13/2016   MCV 85.2 11/13/2016   PLT 217 11/13/2016      Component Value Date/Time   NA 142 06/02/2018 1421   K 4.4 06/02/2018 1421   CL 101 06/02/2018 1421   CO2 24 06/02/2018 1421   GLUCOSE 81 06/02/2018 1421   GLUCOSE 82 11/13/2016 1346   BUN 15 06/02/2018 1421   CREATININE 1.31 (H) 06/02/2018 1421   CREATININE 1.26 11/13/2016 1346   CALCIUM 10.2 06/02/2018 1421   PROT 6.8 06/02/2018 1421   ALBUMIN 4.8 06/02/2018 1421   AST 28 06/02/2018 1421   ALT 35 06/02/2018 1421   ALKPHOS 57 06/02/2018 1421   BILITOT 0.7 06/02/2018 1421   GFRNONAA 59 (L) 06/02/2018 1421   GFRAA 68 06/02/2018 1421   Lab Results  Component Value Date   CHOL 165 09/24/2017   HDL 36 (L) 09/24/2017   LDLCALC 109 (H) 09/24/2017   TRIG 99 09/24/2017   CHOLHDL 4.6 09/24/2017       ASSESSMENT AND PLAN 63 y.o. year old male  has a past medical history of ADHD (attention deficit hyperactivity disorder), Allergy, Anal fissure, Arthritis, Dyslexia, ED (erectile dysfunction), Hemorrhoids, Hemorrhoids, internal, with bleeding (02/13/2015), History of migraine headaches, Sleep disturbance, and Tension headache. here with :  1.  Restless leg syndrome 2.  Back pain  Overall the patient has done well in regards to his back pain.  Today he complains of more symptoms related to his restless legs.  He has gained a significant amount of weight since starting Requip.  I will discontinue this medication.  We have discussed Neupro patch and gabapentin.  We decided on gabapentin at this time.  He will start at a very low dose of 100 mg at bedtime.  Most likely this will need to be increased for him to get relief.  He is advised that he can increase up to 300 mg at bedtime if he is tolerating it well.  He will call if it needs to be increased further.  I spent 25 minutes with the patient this time was spent discussing medication options.   Butch Penny, MSN, NP-C 01/11/2019, 10:24 AM  [facility] [address] [place], [place] [address] [phone]

## 2019-01-12 ENCOUNTER — Encounter: Payer: Self-pay | Admitting: Adult Health

## 2019-01-13 ENCOUNTER — Encounter: Payer: Self-pay | Admitting: Neurology

## 2019-01-23 ENCOUNTER — Encounter: Payer: Self-pay | Admitting: Adult Health

## 2019-01-23 MED ORDER — GABAPENTIN 100 MG PO CAPS: ORAL_CAPSULE | ORAL | 5 refills | Status: DC

## 2019-02-07 ENCOUNTER — Other Ambulatory Visit: Payer: Self-pay | Admitting: Family Medicine

## 2019-02-07 DIAGNOSIS — F341 Dysthymic disorder: Secondary | ICD-10-CM

## 2019-02-07 NOTE — Telephone Encounter (Signed)
[place] is requesting to fill pt wellbutrin. Please advise [facility]

## 2019-02-16 ENCOUNTER — Other Ambulatory Visit: Payer: Self-pay | Admitting: Family Medicine

## 2019-02-16 NOTE — Telephone Encounter (Signed)
[place] is requesting to fill pt flurbiprofen. Please advise [facility]

## 2019-02-21 ENCOUNTER — Other Ambulatory Visit: Payer: Self-pay | Admitting: Family Medicine

## 2019-02-21 ENCOUNTER — Other Ambulatory Visit: Payer: Self-pay | Admitting: Neurology

## 2019-02-21 DIAGNOSIS — F909 Attention-deficit hyperactivity disorder, unspecified type: Secondary | ICD-10-CM

## 2019-02-21 MED ORDER — METHYLPHENIDATE HCL ER (OSM) 36 MG PO TBCR: 36.0000 mg | EXTENDED_RELEASE_TABLET | Freq: Every day | ORAL | 0 refills | Status: DC

## 2019-02-21 NOTE — Telephone Encounter (Signed)
[place] is requesting to fill pt concerta. Please advise [facility]

## 2019-03-07 ENCOUNTER — Encounter: Payer: Self-pay | Admitting: Family Medicine

## 2019-03-07 ENCOUNTER — Encounter: Payer: Self-pay | Admitting: Adult Health

## 2019-03-07 DIAGNOSIS — E291 Testicular hypofunction: Secondary | ICD-10-CM

## 2019-03-07 DIAGNOSIS — E039 Hypothyroidism, unspecified: Secondary | ICD-10-CM

## 2019-03-08 ENCOUNTER — Telehealth: Payer: Self-pay

## 2019-03-08 NOTE — Telephone Encounter (Signed)
Called pt to schedule lab visit . No answer LVM. [place]

## 2019-03-09 ENCOUNTER — Other Ambulatory Visit: Payer: BC Managed Care – PPO

## 2019-03-09 ENCOUNTER — Other Ambulatory Visit: Payer: Self-pay

## 2019-03-09 ENCOUNTER — Telehealth: Payer: Self-pay | Admitting: Internal Medicine

## 2019-03-09 DIAGNOSIS — Z20828 Contact with and (suspected) exposure to other viral communicable diseases: Secondary | ICD-10-CM

## 2019-03-09 DIAGNOSIS — E291 Testicular hypofunction: Secondary | ICD-10-CM | POA: Diagnosis not present

## 2019-03-09 DIAGNOSIS — E039 Hypothyroidism, unspecified: Secondary | ICD-10-CM | POA: Diagnosis not present

## 2019-03-09 DIAGNOSIS — Z20822 Contact with and (suspected) exposure to covid-19: Secondary | ICD-10-CM

## 2019-03-09 NOTE — Telephone Encounter (Signed)
Pt was screened for symptoms and everything was negative. Lab person drew blood and pt was wanting to get tested for coronavirus. Lab tech, started screening pt again and now he has fatigue, joint pain, headache, loss of taste, chest pain and vomiting. He went to the beach recently.  Per [facility]. Put order in for testing and send pt

## 2019-03-10 ENCOUNTER — Other Ambulatory Visit: Payer: Self-pay

## 2019-03-10 DIAGNOSIS — R6889 Other general symptoms and signs: Secondary | ICD-10-CM | POA: Diagnosis not present

## 2019-03-10 DIAGNOSIS — Z20822 Contact with and (suspected) exposure to covid-19: Secondary | ICD-10-CM

## 2019-03-10 LAB — TSH: TSH: 4.24 u[IU]/mL (ref 0.450–4.500)

## 2019-03-10 LAB — TESTOSTERONE: Testosterone: 539 ng/dL (ref 264–916)

## 2019-03-12 LAB — NOVEL CORONAVIRUS, NAA: SARS-CoV-2, NAA: NOT DETECTED

## 2019-03-16 ENCOUNTER — Encounter: Payer: Self-pay | Admitting: Adult Health

## 2019-03-22 ENCOUNTER — Telehealth: Payer: Self-pay | Admitting: Neurology

## 2019-03-22 MED ORDER — TRAZODONE HCL 150 MG PO TABS: ORAL_TABLET | ORAL | 5 refills | Status: DC

## 2019-03-22 NOTE — Telephone Encounter (Signed)
received a notification to the pharmacy that the patient is taking 1.5 tablet of trazodone 150 mg. Spoke with Dr Brett Fairy about this because I don't see where this is indicated or discussed through previous messages. She states she is ok with the increase. I have placed the order and sent to the pharmacy for the patient per Dr Dohmeier verbal order.

## 2019-03-26 NOTE — Progress Notes (Signed)
I agree with the assessment and plan as directed by NP on this visit . I was available for consultation.   Neiva Maenza, MD  

## 2019-03-28 ENCOUNTER — Other Ambulatory Visit: Payer: Self-pay

## 2019-03-28 ENCOUNTER — Encounter: Payer: Self-pay | Admitting: Adult Health

## 2019-03-28 ENCOUNTER — Telehealth: Payer: Self-pay | Admitting: Adult Health

## 2019-03-28 ENCOUNTER — Ambulatory Visit: Payer: BC Managed Care – PPO | Admitting: Adult Health

## 2019-03-28 VITALS — BP 132/90 | HR 106 | Temp 98.0°F | Ht 72.0 in | Wt 195.6 lb

## 2019-03-28 DIAGNOSIS — G2581 Restless legs syndrome: Secondary | ICD-10-CM

## 2019-03-28 MED ORDER — GABAPENTIN 100 MG PO CAPS: 100.0000 mg | ORAL_CAPSULE | Freq: Every day | ORAL | 5 refills | Status: DC

## 2019-03-28 MED ORDER — ROPINIROLE HCL 0.5 MG PO TABS: 0.5000 mg | ORAL_TABLET | Freq: Every day | ORAL | 5 refills | Status: DC

## 2019-03-28 MED ORDER — TRAZODONE HCL 150 MG PO TABS: ORAL_TABLET | ORAL | 5 refills | Status: DC

## 2019-03-28 NOTE — Telephone Encounter (Signed)
Patient had an appointment today and was given a 6 month follow-up. Patient stated at [place] that he will call back to schedule this.

## 2019-03-28 NOTE — Patient Instructions (Signed)
Your Plan:  Continue 1 tablet of Requip, flexeril, trazodone and gabapentin before bedtime.   In the future if you still have hunger or weight issues we will discontinue requip and increase gabapentin to 2 tablets at bedtime  If your symptoms worsen or you develop new symptoms please let us know.    Thank you for coming to see [place] at [facility]. I hope we have been able to provide you high quality care today.  You may receive a patient satisfaction survey over the next few weeks. We would appreciate your feedback and comments so that we may continue to improve ourselves and the health of our patients.

## 2019-03-28 NOTE — Progress Notes (Signed)
PATIENT: Parker Silva DOB: [birth year]  REASON FOR VISIT: follow up HISTORY FROM: patient  HISTORY OF PRESENT ILLNESS: Today 03/28/19:  Mr. Parker Silva is a 63 year old male with a history of low back pain and restless leg syndrome.  He returns today for follow-up.  He states that he has been experimenting with his medication.  He states that he is currently taking 1 tablet of Requip, 1 tablet of trazodone, 1 tablet of Flexeril and 1 tablet of gabapentin before bedtime.  He states that this combination has helped the most with his restless leg.  He states that he is able to get to sleep and stay asleep.  He states that he continues to notice hunger issues and feels that he has had weight gain.  However according to our records he is only gained 3 pounds since his visit in the office in October 2019.  He returns today for an evaluation.  HISTORY 01/11/19:  Mr. Parker Silva is a 63 year old male with a history of low back pain and restless leg syndrome.  He returns today for a virtual visit.  The patient states that his back pain has stabilized.  He states that he continues to have trouble with his restless legs.  Reports that he wakes up approximately 4 times during the night.  He states that his legs will jerk and knock the sheets off his bed.  He states he typically has to get up and walk in order to get some relief.  He states that during the day if he is sitting he tapping his toes however his symptoms during the day are not as severe as at night.  He states he has also noticed that since he started Requip he has been snacking a lot.  Reports that he has gained approximately 12 to 15 pounds since he started this medication.  He continues to take Flexeril 10 mg at bedtime.  He is also on Concerta prescribed by Dr. Vickey Hugerohmeier.  He joins me today for a virtual visit.   REVIEW OF SYSTEMS: Out of a complete 14 system review of symptoms, the patient complains only of the following symptoms, and all other  reviewed systems are negative.  Memory loss, dizziness, tremors  ALLERGIES: No Known Allergies  HOME MEDICATIONS: Outpatient Medications Prior to Visit  Medication Sig Dispense Refill  . buPROPion (WELLBUTRIN SR) 150 MG 12 hr tablet TAKE 1 TABLET ONCE DAILY. 90 tablet 1  . Cholecalciferol (VITAMIN D3) 5000 units TABS Take 1 tablet by mouth 2 (two) times daily.    . cyclobenzaprine (FLEXERIL) 10 MG tablet TAKE ONE TABLET AT BEDTIME. 90 tablet 0  . flurbiprofen (ANSAID) 100 MG tablet TAKE 1 TABLET TWICE DAILY AS NEEDED FOR HEADACHE. NO MORE THAN 4 DAYS/WEEK. 40 tablet 0  . Magnesium 500 MG TABS Take 1 tablet by mouth daily.    . Menaquinone-7 (VITAMIN K2 PO) Take 150 mg by mouth daily.    . methylphenidate (CONCERTA) 36 MG PO CR tablet Take 1 tablet (36 mg total) by mouth daily. 31 tablet 0  . Methylsulfonylmethane (MSM PO) Take 1,000 mg by mouth 4 (four) times daily.    . Multiple Vitamins-Minerals (MULTIVITAMIN WITH MINERALS) tablet Take 1 tablet by mouth daily. Reported on 03/04/2016    . Omega-3 Fatty Acids (SUPER OMEGA 3 PO) Take 4 capsules by mouth daily.    . Prasterone, DHEA, (DHEA 50 PO) Take 1 tablet by mouth daily.    . rizatriptan (MAXALT) 10 MG tablet Take  1 tablet (10 mg total) by mouth as needed (not had in 1 year as of 01-23-2015). May repeat in 2 hours if needed 10 tablet 1  . sildenafil (REVATIO) 20 MG tablet TAKE UP TO 5 TABLETS DAILY AS NEEDED. 20 tablet 1  . vitamin A 4098110000 UNIT capsule Take 10,000 Units by mouth daily.    Marland Kitchen. amoxicillin-clavulanate (AUGMENTIN) 875-125 MG tablet Take 1 tablet by mouth 2 (two) times daily. 20 tablet 0  . ARMOUR THYROID 30 MG tablet BREAK 1 TABLET WITH TEETH AND DISSOLVE UNDER TONGUE IN MORNING DAILY.MAY EAT IN 15 MINUTES 30 tablet 0  . [START ON 04/23/2019] CONCERTA 36 MG CR tablet TAKE 1 TABLET ONCE DAILY. 30 tablet 0  . diazepam (VALIUM) 5 MG tablet Take 1 tablet (5 mg total) by mouth every 6 (six) hours as needed for muscle spasms. 30  tablet 0  . FEVERFEW PO Take 2 tablets by mouth daily.    . fexofenadine (ALLEGRA) 30 MG tablet Take 30 mg by mouth 2 (two) times daily.    . fluticasone (FLONASE) 50 MCG/ACT nasal spray USE 2 SPRAYS IN EACH NOSTRIL ONCE DAILY 16 g 11  . gabapentin (NEURONTIN) 100 MG capsule Take 2 tabs PO at bedtime for 1 week then 3 tabs PO at bedtime thereafter. 90 capsule 5  . levocetirizine (XYZAL ALLERGY 24HR) 5 MG tablet Take 5 mg by mouth every evening.    . methylphenidate (CONCERTA) 36 MG PO CR tablet Take 1 tablet (36 mg total) by mouth daily. 31 tablet 0  . ondansetron (ZOFRAN-ODT) 8 MG disintegrating tablet Take 1 tablet (8 mg total) by mouth every 8 (eight) hours as needed for nausea or vomiting. 10 tablet 0  . OVER THE COUNTER MEDICATION 2 tablets daily. Florassust mood    . OVER THE COUNTER MEDICATION 100 mg 3 (three) times daily. Super Ubiquinol    . rOPINIRole (REQUIP) 0.5 MG tablet     . testosterone cypionate (DEPOTESTOTERONE CYPIONATE) 100 MG/ML injection Inject into the muscle once a week. For IM use only    . topiramate (TOPAMAX) 25 MG capsule Take 25 mg by mouth daily.     . traZODone (DESYREL) 150 MG tablet Take 1.5 tablet at bedtime as needed 45 tablet 5   No facility-administered medications prior to visit.     PAST MEDICAL HISTORY: Past Medical History:  Diagnosis Date  . ADHD (attention deficit hyperactivity disorder)   . Allergy    RHINITIS-seasonal  . Anal fissure   . Arthritis    back,neck  . Dyslexia   . ED (erectile dysfunction)   . Hemorrhoids   . Hemorrhoids, internal, with bleeding 02/13/2015   Grade 2 hemorrhoids 02/13/15 band ligation right posterior hemorrhoidal bundle 03/04/15 band ligation right anterior hemorrhoidal bundle 04/15/15 band ligation left lateral hemorrhoidal bundle   . History of migraine headaches   . Sleep disturbance   . Tension headache     PAST SURGICAL HISTORY: Past Surgical History:  Procedure Laterality Date  . APPENDECTOMY    .  COLONOSCOPY  2006  . HEMORRHOID BANDING    . TONSILLECTOMY      FAMILY HISTORY: Family History  Problem Relation Age of Onset  . Breast cancer Mother   . Diabetes type II Father   . Heart failure Father   . COPD Father   . Colon cancer Neg Hx   . Rectal cancer Neg Hx   . Stomach cancer Neg Hx     SOCIAL HISTORY: Social  History   Socioeconomic History  . Marital status: Single    Spouse name: Not on file  . Number of children: 0  . Years of education: Not on file  . Highest education level: Not on file  Occupational History  . Occupation: owns Energy manager: [employer]  . Financial resource strain: Not on file  . Food insecurity    Worry: Not on file    Inability: Not on file  . Transportation needs    Medical: Not on file    Non-medical: Not on file  Tobacco Use  . Smoking status: Never Smoker  . Smokeless tobacco: Never Used  Substance and Sexual Activity  . Alcohol use: Yes    Comment: no  . Drug use: No  . Sexual activity: Not Currently  Lifestyle  . Physical activity    Days per week: Not on file    Minutes per session: Not on file  . Stress: Not on file  Relationships  . Social Herbalist on phone: Not on file    Gets together: Not on file    Attends religious service: Not on file    Active member of club or organization: Not on file    Attends meetings of clubs or organizations: Not on file    Relationship status: Not on file  . Intimate partner violence    Fear of current or ex partner: Not on file    Emotionally abused: Not on file    Physically abused: Not on file    Forced sexual activity: Not on file  Other Topics Concern  . Not on file  Social History Narrative   Lives alone in a one story home in [place].      PHYSICAL EXAM  Vitals:   03/28/19 1359  BP: 132/90  Pulse: (!) 106  Temp: 98 F (36.7 C)  Weight: 195 lb 9.6 oz (88.7 kg)  Height: 6' (1.829 m)   Body mass  index is 26.53 kg/m.  Generalized: Well developed, in no acute distress   Neurological examination  Mentation: Alert oriented to time, place, history taking. Follows all commands speech and language fluent Cranial nerve II-XII: Extraocular movements were full, visual field were full on confrontational test.  Head turning and shoulder shrug  were normal and symmetric. Motor: The motor testing reveals 5 over 5 strength of all 4 extremities. Good symmetric motor tone is noted throughout.  Sensory: Sensory testing is intact to soft touch on all 4 extremities. No evidence of extinction is noted.  Coordination: Cerebellar testing reveals good finger-nose-finger and heel-to-shin bilaterally.  Gait and station: Gait is normal.  Reflexes: Deep tendon reflexes are symmetric and normal bilaterally.   DIAGNOSTIC DATA (LABS, IMAGING, TESTING) - I reviewed patient records, labs, notes, testing and imaging myself where available.  Lab Results  Component Value Date   WBC 4.9 11/13/2016   HGB 16.8 11/13/2016   HCT 47.8 11/13/2016   MCV 85.2 11/13/2016   PLT 217 11/13/2016      Component Value Date/Time   NA 142 06/02/2018 1421   K 4.4 06/02/2018 1421   CL 101 06/02/2018 1421   CO2 24 06/02/2018 1421   GLUCOSE 81 06/02/2018 1421   GLUCOSE 82 11/13/2016 1346   BUN 15 06/02/2018 1421   CREATININE 1.31 (H) 06/02/2018 1421   CREATININE 1.26 11/13/2016 1346   CALCIUM 10.2 06/02/2018 1421   PROT 6.8 06/02/2018 1421  ALBUMIN 4.8 06/02/2018 1421   AST 28 06/02/2018 1421   ALT 35 06/02/2018 1421   ALKPHOS 57 06/02/2018 1421   BILITOT 0.7 06/02/2018 1421   GFRNONAA 59 (L) 06/02/2018 1421   GFRAA 68 06/02/2018 1421   Lab Results  Component Value Date   CHOL 165 09/24/2017   HDL 36 (L) 09/24/2017   LDLCALC 109 (H) 09/24/2017   TRIG 99 09/24/2017   CHOLHDL 4.6 09/24/2017    Lab Results  Component Value Date   TSH 4.240 03/09/2019      ASSESSMENT AND PLAN 63 y.o. year old male  has a  past medical history of ADHD (attention deficit hyperactivity disorder), Allergy, Anal fissure, Arthritis, Dyslexia, ED (erectile dysfunction), Hemorrhoids, Hemorrhoids, internal, with bleeding (02/13/2015), History of migraine headaches, Sleep disturbance, and Tension headache. here with:  1.  Restless leg syndrome  The patient will continue taking 1 tablet of Requip, trazodone, Flexeril and gabapentin before bedtime.  If this continues to control his restless leg symptoms he can continue this regimen.  In the future if he begins to have weight gain or breakthrough symptoms.  We may consider discontinuing Requip and increasing gabapentin.  Patient is amenable to this plan.  He will follow-up in 6 months or sooner if needed.   I spent 15 minutes with the patient. 50% of this time was spent discussing his plan of care   Butch PennyMegan Cythnia Osmun, MSN, NP-C 03/28/2019, 2:41 PM [facility] [address], [address] [place], [place] [address] [phone]

## 2019-04-21 ENCOUNTER — Other Ambulatory Visit: Payer: Self-pay | Admitting: Neurology

## 2019-05-15 ENCOUNTER — Encounter: Payer: Self-pay | Admitting: Adult Health

## 2019-05-16 ENCOUNTER — Ambulatory Visit: Payer: BLUE CROSS/BLUE SHIELD | Admitting: Adult Health

## 2019-05-20 ENCOUNTER — Other Ambulatory Visit: Payer: Self-pay | Admitting: Adult Health

## 2019-05-23 ENCOUNTER — Other Ambulatory Visit: Payer: Self-pay | Admitting: Neurology

## 2019-05-24 ENCOUNTER — Other Ambulatory Visit: Payer: Self-pay | Admitting: Family Medicine

## 2019-05-24 MED ORDER — METHYLPHENIDATE HCL ER (OSM) 36 MG PO TBCR: 36.0000 mg | EXTENDED_RELEASE_TABLET | Freq: Every day | ORAL | 0 refills | Status: DC

## 2019-05-24 NOTE — Telephone Encounter (Signed)
Is this ok to refill?  

## 2019-05-31 ENCOUNTER — Encounter: Payer: Self-pay | Admitting: Family Medicine

## 2019-06-13 ENCOUNTER — Ambulatory Visit: Payer: BC Managed Care – PPO | Admitting: Family Medicine

## 2019-06-13 ENCOUNTER — Other Ambulatory Visit: Payer: Self-pay

## 2019-06-13 ENCOUNTER — Encounter: Payer: Self-pay | Admitting: Family Medicine

## 2019-06-13 VITALS — BP 130/76 | HR 81 | Temp 96.6°F | Wt 189.0 lb

## 2019-06-13 DIAGNOSIS — F909 Attention-deficit hyperactivity disorder, unspecified type: Secondary | ICD-10-CM

## 2019-06-13 DIAGNOSIS — G2581 Restless legs syndrome: Secondary | ICD-10-CM | POA: Diagnosis not present

## 2019-06-13 DIAGNOSIS — G4750 Parasomnia, unspecified: Secondary | ICD-10-CM

## 2019-06-13 DIAGNOSIS — Z23 Encounter for immunization: Secondary | ICD-10-CM | POA: Diagnosis not present

## 2019-06-13 DIAGNOSIS — G43009 Migraine without aura, not intractable, without status migrainosus: Secondary | ICD-10-CM

## 2019-06-13 DIAGNOSIS — Z13220 Encounter for screening for lipoid disorders: Secondary | ICD-10-CM

## 2019-06-13 DIAGNOSIS — N529 Male erectile dysfunction, unspecified: Secondary | ICD-10-CM | POA: Diagnosis not present

## 2019-06-13 DIAGNOSIS — F341 Dysthymic disorder: Secondary | ICD-10-CM | POA: Diagnosis not present

## 2019-06-13 DIAGNOSIS — G44209 Tension-type headache, unspecified, not intractable: Secondary | ICD-10-CM

## 2019-06-13 DIAGNOSIS — G479 Sleep disorder, unspecified: Secondary | ICD-10-CM

## 2019-06-13 MED ORDER — BUPROPION HCL ER (SR) 150 MG PO TB12: 150.0000 mg | ORAL_TABLET | Freq: Every day | ORAL | 3 refills | Status: DC

## 2019-06-13 MED ORDER — SILDENAFIL CITRATE 20 MG PO TABS: ORAL_TABLET | ORAL | 5 refills | Status: DC

## 2019-06-13 NOTE — Progress Notes (Signed)
   Subjective:    Patient ID: Parker Silva, male    DOB: Aug 29, 1955, 63 y.o.   MRN: [id number]  HPI He is here for med check appointment.  He is followed by Dr. Brett Fairy for underlying sleep disturbance.  He seems to be fairly stable on his present dosing regimen of trazodone, Requip and gabapentin.  He continues to do well on Concerta stating the medication last 8-10 hours. He continues to do well on Wellbutrin and has no desire to stop.  His migraines do not seem to be causing him as much difficulty.  He does have underlying ED. he does have a history of tension headaches.  His work is quite stressful.  Review of Systems     Objective:   Physical Exam Alert and in no distress. Tympanic membranes and canals are normal. Pharyngeal area is normal. Neck is supple without adenopathy or thyromegaly. Cardiac exam shows a regular sinus rhythm without murmurs or gallops. Lungs are clear to auscultation.       Assessment & Plan:  Attention deficit hyperactivity disorder (ADHD), unspecified ADHD type  RLS (restless legs syndrome)  Parasomnia, unspecified type  Erectile dysfunction, unspecified erectile dysfunction type - Plan: sildenafil (REVATIO) 20 MG tablet, CBC with Differential/Platelet, Comprehensive metabolic panel  Need for influenza vaccination - Plan: Flu Vaccine QUAD 6+ mos PF IM (Fluarix Quad PF)  Need for shingles vaccine - Plan: Varicella-zoster vaccine IM  Migraine without aura and without status migrainosus, not intractable  Sleep disturbance  Dysthymia - Plan: CBC with Differential/Platelet, Comprehensive metabolic panel, buPROPion (WELLBUTRIN SR) 150 MG 12 hr tablet  Tension headache - Plan: CBC with Differential/Platelet, Comprehensive metabolic panel  Screening for lipid disorders - Plan: Lipid panel  He seems to be stable on his present medication regimen.  His immunizations were updated.  He will continue to be followed by Dr. Brett Fairy.

## 2019-06-14 LAB — COMPREHENSIVE METABOLIC PANEL
ALT: 20 IU/L (ref 0–44)
AST: 16 IU/L (ref 0–40)
Albumin/Globulin Ratio: 2.5 — ABNORMAL HIGH (ref 1.2–2.2)
Albumin: 5 g/dL — ABNORMAL HIGH (ref 3.8–4.8)
Alkaline Phosphatase: 64 IU/L (ref 39–117)
BUN/Creatinine Ratio: 17 (ref 10–24)
BUN: 19 mg/dL (ref 8–27)
Bilirubin Total: 0.3 mg/dL (ref 0.0–1.2)
CO2: 26 mmol/L (ref 20–29)
Calcium: 10 mg/dL (ref 8.6–10.2)
Chloride: 103 mmol/L (ref 96–106)
Creatinine, Ser: 1.13 mg/dL (ref 0.76–1.27)
GFR calc Af Amer: 81 mL/min/{1.73_m2} (ref >59)
GFR calc non Af Amer: 70 mL/min/{1.73_m2} (ref >59)
Globulin, Total: 2 g/dL (ref 1.5–4.5)
Glucose: 82 mg/dL (ref 65–99)
Potassium: 4.8 mmol/L (ref 3.5–5.2)
Sodium: 141 mmol/L (ref 134–144)
Total Protein: 7 g/dL (ref 6.0–8.5)

## 2019-06-14 LAB — CBC WITH DIFFERENTIAL/PLATELET
Basophils Absolute: 0.1 10*3/uL (ref 0.0–0.2)
Basos: 1 %
EOS (ABSOLUTE): 0.1 10*3/uL (ref 0.0–0.4)
Eos: 2 %
Hematocrit: 49 % (ref 37.5–51.0)
Hemoglobin: 16.6 g/dL (ref 13.0–17.7)
Immature Grans (Abs): 0 10*3/uL (ref 0.0–0.1)
Immature Granulocytes: 0 %
Lymphocytes Absolute: 1.9 10*3/uL (ref 0.7–3.1)
Lymphs: 33 %
MCH: 29.1 pg (ref 26.6–33.0)
MCHC: 33.9 g/dL (ref 31.5–35.7)
MCV: 86 fL (ref 79–97)
Monocytes Absolute: 0.5 10*3/uL (ref 0.1–0.9)
Monocytes: 9 %
Neutrophils Absolute: 3.2 10*3/uL (ref 1.4–7.0)
Neutrophils: 55 %
Platelets: 240 10*3/uL (ref 150–450)
RBC: 5.71 x10E6/uL (ref 4.14–5.80)
RDW: 11.7 % (ref 11.6–15.4)
WBC: 5.8 10*3/uL (ref 3.4–10.8)

## 2019-06-14 LAB — LIPID PANEL
Chol/HDL Ratio: 5.5 ratio — ABNORMAL HIGH (ref 0.0–5.0)
Cholesterol, Total: 198 mg/dL (ref 100–199)
HDL: 36 mg/dL — ABNORMAL LOW (ref >39)
LDL Chol Calc (NIH): 131 mg/dL — ABNORMAL HIGH (ref 0–99)
Triglycerides: 172 mg/dL — ABNORMAL HIGH (ref 0–149)
VLDL Cholesterol Cal: 31 mg/dL (ref 5–40)

## 2019-06-15 ENCOUNTER — Encounter: Payer: Self-pay | Admitting: Family Medicine

## 2019-06-19 ENCOUNTER — Other Ambulatory Visit: Payer: Self-pay | Admitting: Neurology

## 2019-07-13 ENCOUNTER — Other Ambulatory Visit: Payer: Self-pay | Admitting: Family Medicine

## 2019-07-14 ENCOUNTER — Encounter: Payer: Self-pay | Admitting: Family Medicine

## 2019-07-21 ENCOUNTER — Encounter: Payer: Self-pay | Admitting: Neurology

## 2019-08-14 ENCOUNTER — Encounter: Payer: Self-pay | Admitting: Family Medicine

## 2019-08-14 MED ORDER — AMOXICILLIN-POT CLAVULANATE 875-125 MG PO TABS: 1.0000 | ORAL_TABLET | Freq: Two times a day (BID) | ORAL | 0 refills | Status: DC

## 2019-08-22 ENCOUNTER — Other Ambulatory Visit: Payer: Self-pay | Admitting: Family Medicine

## 2019-08-22 ENCOUNTER — Other Ambulatory Visit: Payer: Self-pay | Admitting: Neurology

## 2019-08-22 ENCOUNTER — Other Ambulatory Visit: Payer: Self-pay | Admitting: Adult Health

## 2019-08-22 NOTE — Telephone Encounter (Signed)
Parker Silva is requesting to fill pt methylphenidate for 90 days. Please advise [facility]

## 2019-09-21 ENCOUNTER — Other Ambulatory Visit: Payer: Self-pay | Admitting: Family Medicine

## 2019-09-21 MED ORDER — METHYLPHENIDATE HCL ER (OSM) 36 MG PO TBCR: EXTENDED_RELEASE_TABLET | ORAL | 0 refills | Status: DC

## 2019-09-21 NOTE — Telephone Encounter (Signed)
[facility] is requesting to fill pt methylhenidate. Please advise. KH

## 2019-10-05 DIAGNOSIS — M542 Cervicalgia: Secondary | ICD-10-CM | POA: Diagnosis not present

## 2019-10-23 ENCOUNTER — Other Ambulatory Visit: Payer: Self-pay | Admitting: Family Medicine

## 2019-10-23 NOTE — Telephone Encounter (Signed)
Is this okay to refill? 

## 2019-10-24 ENCOUNTER — Other Ambulatory Visit: Payer: Self-pay | Admitting: Family Medicine

## 2019-10-24 MED ORDER — METHYLPHENIDATE HCL ER (OSM) 36 MG PO TBCR: 36.0000 mg | EXTENDED_RELEASE_TABLET | Freq: Every day | ORAL | 0 refills | Status: DC

## 2019-11-01 ENCOUNTER — Other Ambulatory Visit: Payer: Self-pay | Admitting: Adult Health

## 2019-11-02 ENCOUNTER — Telehealth: Payer: Self-pay | Admitting: Adult Health

## 2019-11-02 ENCOUNTER — Other Ambulatory Visit: Payer: Self-pay | Admitting: Adult Health

## 2019-11-02 DIAGNOSIS — M5441 Lumbago with sciatica, right side: Secondary | ICD-10-CM | POA: Diagnosis not present

## 2019-11-02 MED ORDER — ROPINIROLE HCL 0.5 MG PO TABS: 0.5000 mg | ORAL_TABLET | Freq: Every day | ORAL | 0 refills | Status: DC

## 2019-11-02 NOTE — Telephone Encounter (Signed)
90 day supply of requip sent in. Pt is due for an appt.

## 2019-11-02 NOTE — Telephone Encounter (Signed)
Rep with [facility] called in regards to the patients rOPINIRole (REQUIP) 0.5 MG tablet  States the patient is in a med program and only filled 56 of medication and patient does have about a month supply left and is needing a 90 day qty prescription in order to be able to fill his medication all at once.

## 2019-11-09 ENCOUNTER — Other Ambulatory Visit: Payer: Self-pay | Admitting: Adult Health

## 2019-11-10 ENCOUNTER — Other Ambulatory Visit: Payer: Self-pay | Admitting: Adult Health

## 2019-11-13 ENCOUNTER — Encounter: Payer: Self-pay | Admitting: Adult Health

## 2019-11-13 MED ORDER — GABAPENTIN 100 MG PO CAPS: 300.0000 mg | ORAL_CAPSULE | Freq: Every day | ORAL | 0 refills | Status: DC

## 2019-11-13 NOTE — Telephone Encounter (Signed)
Hi Sandy, Please make an appointment for [facility] with Mr.Lesage. He needs gabapentin refills. Thank you.

## 2019-11-16 ENCOUNTER — Encounter: Payer: Self-pay | Admitting: Adult Health

## 2019-11-16 ENCOUNTER — Ambulatory Visit: Payer: BC Managed Care – PPO | Admitting: Adult Health

## 2019-11-16 ENCOUNTER — Other Ambulatory Visit: Payer: Self-pay

## 2019-11-16 VITALS — BP 134/84 | HR 84 | Temp 97.7°F | Ht 72.0 in | Wt 180.0 lb

## 2019-11-16 DIAGNOSIS — G2581 Restless legs syndrome: Secondary | ICD-10-CM | POA: Diagnosis not present

## 2019-11-16 DIAGNOSIS — G4762 Sleep related leg cramps: Secondary | ICD-10-CM

## 2019-11-16 MED ORDER — TRAZODONE HCL 150 MG PO TABS: ORAL_TABLET | ORAL | 3 refills | Status: DC

## 2019-11-16 MED ORDER — GABAPENTIN 100 MG PO CAPS: 100.0000 mg | ORAL_CAPSULE | Freq: Every day | ORAL | 3 refills | Status: DC

## 2019-11-16 MED ORDER — CYCLOBENZAPRINE HCL 10 MG PO TABS: 10.0000 mg | ORAL_TABLET | Freq: Every day | ORAL | 3 refills | Status: DC

## 2019-11-16 MED ORDER — ROPINIROLE HCL 0.5 MG PO TABS: 0.5000 mg | ORAL_TABLET | Freq: Every day | ORAL | 3 refills | Status: DC

## 2019-11-16 NOTE — Progress Notes (Signed)
PATIENT: Parker Silva DOB: 11/02/1955  REASON FOR VISIT: follow up HISTORY FROM: patient  HISTORY OF PRESENT ILLNESS: Today 11/16/19:  Mr. Demello is a 64 year old male with a history of low back pain and restless leg syndrome.  He returns today for follow-up.  He continues to take Requip, trazodone, Flexeril and gabapentin before bedtime.  He states that this typically works fairly well for him.  He may wake up at 3 or 4 AM but typically can get back to sleep.  He states recently he has been waking up and will get a muscle cramp in the right calf.  He states that he has been working outdoors more.  He knows his water intake has decreased.  He does take magnesium supplement.  Returns today for an evaluation.  HISTORY 03/28/19:  Mr. Kiel is a 64 year old male with a history of low back pain and restless leg syndrome.  He returns today for follow-up.  He states that he has been experimenting with his medication.  He states that he is currently taking 1 tablet of Requip, 1 tablet of trazodone, 1 tablet of Flexeril and 1 tablet of gabapentin before bedtime.  He states that this combination has helped the most with his restless leg.  He states that he is able to get to sleep and stay asleep.  He states that he continues to notice hunger issues and feels that he has had weight gain.  However according to our records he is only gained 3 pounds since his visit in the office in October 2019.  He returns today for an evaluation.  REVIEW OF SYSTEMS: Out of a complete 14 system review of symptoms, the patient complains only of the following symptoms, and all other reviewed systems are negative.  See HPI  ALLERGIES: No Known Allergies  HOME MEDICATIONS: Outpatient Medications Prior to Visit  Medication Sig Dispense Refill  . buPROPion (WELLBUTRIN SR) 150 MG 12 hr tablet Take 1 tablet (150 mg total) by mouth daily. 90 tablet 3  . Cholecalciferol (VITAMIN D3) 5000 units TABS Take 1 tablet by  mouth 2 (two) times daily.    . cyclobenzaprine (FLEXERIL) 10 MG tablet TAKE ONE TABLET AT BEDTIME. 90 tablet 1  . flurbiprofen (ANSAID) 100 MG tablet TAKE 1 TABLET TWICE DAILY AS NEEDED FOR HEADACHE. NO MORE THAN 4 DAYS/WEEK. 40 tablet 0  . gabapentin (NEURONTIN) 100 MG capsule Take 3 capsules (300 mg total) by mouth at bedtime. 270 capsule 0  . Magnesium 500 MG TABS Take 1 tablet by mouth daily.    . methylphenidate (CONCERTA) 36 MG PO CR tablet Take 1 tablet (36 mg total) by mouth daily. 90 tablet 0  . Multiple Vitamins-Minerals (MULTIVITAMIN WITH MINERALS) tablet Take 1 tablet by mouth daily. Reported on 03/04/2016    . rizatriptan (MAXALT) 10 MG tablet Take 1 tablet (10 mg total) by mouth as needed (not had in 1 year as of 01-23-2015). May repeat in 2 hours if needed 10 tablet 1  . rOPINIRole (REQUIP) 0.5 MG tablet Take 1 tablet (0.5 mg total) by mouth at bedtime. 90 tablet 0  . sildenafil (REVATIO) 20 MG tablet TAKE UP TO 5 TABLETS DAILY AS NEEDED. 30 tablet 5  . traZODone (DESYREL) 150 MG tablet TAKE 1 & 1/2 TABLETS AT BEDTIME AS NEEDED FOR SLEEP. 135 tablet 1  . vitamin A 07622 UNIT capsule Take 10,000 Units by mouth daily.    . Menaquinone-7 (VITAMIN K2 PO) Take 150 mg by mouth  daily.    . Methylsulfonylmethane (MSM PO) Take 1,000 mg by mouth 4 (four) times daily.    Marland Kitchen amoxicillin-clavulanate (AUGMENTIN) 875-125 MG tablet Take 1 tablet by mouth 2 (two) times daily. 20 tablet 0  . Omega-3 Fatty Acids (SUPER OMEGA 3 PO) Take 4 capsules by mouth daily.    . Prasterone, DHEA, (DHEA 50 PO) Take 1 tablet by mouth daily.     No facility-administered medications prior to visit.    PAST MEDICAL HISTORY: Past Medical History:  Diagnosis Date  . ADHD (attention deficit hyperactivity disorder)   . Allergy    RHINITIS-seasonal  . Anal fissure   . Arthritis    back,neck  . Dyslexia   . ED (erectile dysfunction)   . Hemorrhoids   . Hemorrhoids, internal, with bleeding 02/13/2015   Grade 2  hemorrhoids 02/13/15 band ligation right posterior hemorrhoidal bundle 03/04/15 band ligation right anterior hemorrhoidal bundle 04/15/15 band ligation left lateral hemorrhoidal bundle   . History of migraine headaches   . Sleep disturbance   . Tension headache     PAST SURGICAL HISTORY: Past Surgical History:  Procedure Laterality Date  . APPENDECTOMY    . COLONOSCOPY  2006  . HEMORRHOID BANDING    . TONSILLECTOMY      FAMILY HISTORY: Family History  Problem Relation Age of Onset  . Breast cancer Mother   . Diabetes type II Father   . Heart failure Father   . COPD Father   . Colon cancer Neg Hx   . Rectal cancer Neg Hx   . Stomach cancer Neg Hx     SOCIAL HISTORY: Social History   Socioeconomic History  . Marital status: Single    Spouse name: Not on file  . Number of children: 0  . Years of education: Not on file  . Highest education level: Not on file  Occupational History  . Occupation: owns Energy manager: [employer]  Tobacco Use  . Smoking status: Never Smoker  . Smokeless tobacco: Never Used  Substance and Sexual Activity  . Alcohol use: Yes    Comment: no  . Drug use: No  . Sexual activity: Not Currently  Other Topics Concern  . Not on file  Social History Narrative   Lives alone in a one story home in [place].   Social Determinants of Health   Financial Resource Strain:   . Difficulty of Paying Living Expenses:   Food Insecurity:   . Worried About Charity fundraiser in the Last Year:   . Arboriculturist in the Last Year:   Transportation Needs:   . Film/video editor (Medical):   Marland Kitchen Lack of Transportation (Non-Medical):   Physical Activity:   . Days of Exercise per Week:   . Minutes of Exercise per Session:   Stress:   . Feeling of Stress :   Social Connections:   . Frequency of Communication with Friends and Family:   . Frequency of Social Gatherings with Friends and Family:   . Attends Religious  Services:   . Active Member of Clubs or Organizations:   . Attends Archivist Meetings:   Marland Kitchen Marital Status:   Intimate Partner Violence:   . Fear of Current or Ex-Partner:   . Emotionally Abused:   Marland Kitchen Physically Abused:   . Sexually Abused:       PHYSICAL EXAM  Vitals:   11/16/19 0915  BP: 134/84  Pulse: 84  Temp: 97.7 F (36.5 C)  Weight: 180 lb (81.6 kg)  Height: 6' (1.829 m)   Body mass index is 24.41 kg/m.  Generalized: Well developed, in no acute distress   Neurological examination  Mentation: Alert oriented to time, place, history taking. Follows all commands speech and language fluent Cranial nerve II-XII: Pupils were equal round reactive to light. Extraocular movements were full, visual field were full on confrontational test.  Head turning and shoulder shrug  were normal and symmetric. Motor: The motor testing reveals 5 over 5 strength of all 4 extremities. Good symmetric motor tone is noted throughout.  Sensory: Sensory testing is intact to soft touch on all 4 extremities. No evidence of extinction is noted.  Coordination: Cerebellar testing reveals good finger-nose-finger and heel-to-shin bilaterally.  Gait and station: Gait is normal.     DIAGNOSTIC DATA (LABS, IMAGING, TESTING) - I reviewed patient records, labs, notes, testing and imaging myself where available.  Lab Results  Component Value Date   WBC 5.8 06/13/2019   HGB 16.6 06/13/2019   HCT 49.0 06/13/2019   MCV 86 06/13/2019   PLT 240 06/13/2019      Component Value Date/Time   NA 141 06/13/2019 1140   K 4.8 06/13/2019 1140   CL 103 06/13/2019 1140   CO2 26 06/13/2019 1140   GLUCOSE 82 06/13/2019 1140   GLUCOSE 82 11/13/2016 1346   BUN 19 06/13/2019 1140   CREATININE 1.13 06/13/2019 1140   CREATININE 1.26 11/13/2016 1346   CALCIUM 10.0 06/13/2019 1140   PROT 7.0 06/13/2019 1140   ALBUMIN 5.0 (H) 06/13/2019 1140   AST 16 06/13/2019 1140   ALT 20 06/13/2019 1140   ALKPHOS 64  06/13/2019 1140   BILITOT 0.3 06/13/2019 1140   GFRNONAA 70 06/13/2019 1140   GFRAA 81 06/13/2019 1140   Lab Results  Component Value Date   CHOL 198 06/13/2019   HDL 36 (L) 06/13/2019   LDLCALC 131 (H) 06/13/2019   TRIG 172 (H) 06/13/2019   CHOLHDL 5.5 (H) 06/13/2019   No results found for: HGBA1C No results found for: VITAMINB12 Lab Results  Component Value Date   TSH 4.240 03/09/2019      ASSESSMENT AND PLAN 64 y.o. year old male  has a past medical history of ADHD (attention deficit hyperactivity disorder), Allergy, Anal fissure, Arthritis, Dyslexia, ED (erectile dysfunction), Hemorrhoids, Hemorrhoids, internal, with bleeding (02/13/2015), History of migraine headaches, Sleep disturbance, and Tension headache. here with :  1. Restless leg syndrome  -Continue Requip -Continue trazodone -Continue gabapentin -Continue Flexeril  2.  Nocturnal leg cramps  -Increase water intake -If no improvement please let us know   Advised to follow-up in 1 year or sooner if needed  I spent 20 minutes of face-to-face and non-face-to-face time with patient.  This included previsit chart review, lab review, study review, order entry, electronic health record documentation, patient education.  Butch Penny, MSN, NP-C 11/16/2019, 9:34 AM [facility] [address], [address] [place], [place] [address] [phone]

## 2019-11-16 NOTE — Patient Instructions (Signed)
Your Plan:  Continue 1 tablet of Requip, flexeril, trazodone and gabapentin before bedtime.    Thank you for coming to see [place] at [facility]. I hope we have been able to provide you high quality care today.  You may receive a patient satisfaction survey over the next few weeks. We would appreciate your feedback and comments so that we may continue to improve ourselves and the health of our patients.

## 2019-11-21 ENCOUNTER — Other Ambulatory Visit: Payer: Self-pay | Admitting: Medical

## 2019-11-29 DIAGNOSIS — M542 Cervicalgia: Secondary | ICD-10-CM | POA: Diagnosis not present

## 2019-11-29 DIAGNOSIS — M5441 Lumbago with sciatica, right side: Secondary | ICD-10-CM | POA: Diagnosis not present

## 2019-11-29 DIAGNOSIS — R030 Elevated blood-pressure reading, without diagnosis of hypertension: Secondary | ICD-10-CM | POA: Diagnosis not present

## 2020-01-19 ENCOUNTER — Other Ambulatory Visit: Payer: Self-pay | Admitting: Family Medicine

## 2020-01-25 ENCOUNTER — Encounter: Payer: Self-pay | Admitting: Family Medicine

## 2020-01-30 DIAGNOSIS — M5030 Other cervical disc degeneration, unspecified cervical region: Secondary | ICD-10-CM | POA: Diagnosis not present

## 2020-02-06 DIAGNOSIS — M545 Low back pain: Secondary | ICD-10-CM | POA: Diagnosis not present

## 2020-02-06 DIAGNOSIS — M542 Cervicalgia: Secondary | ICD-10-CM | POA: Diagnosis not present

## 2020-02-13 ENCOUNTER — Other Ambulatory Visit: Payer: Self-pay | Admitting: Neurological Surgery

## 2020-02-13 DIAGNOSIS — M545 Low back pain, unspecified: Secondary | ICD-10-CM

## 2020-02-13 DIAGNOSIS — M542 Cervicalgia: Secondary | ICD-10-CM

## 2020-03-06 DIAGNOSIS — M5030 Other cervical disc degeneration, unspecified cervical region: Secondary | ICD-10-CM | POA: Diagnosis not present

## 2020-03-14 DIAGNOSIS — M5030 Other cervical disc degeneration, unspecified cervical region: Secondary | ICD-10-CM | POA: Diagnosis not present

## 2020-03-18 ENCOUNTER — Ambulatory Visit
Admission: RE | Admit: 2020-03-18 | Discharge: 2020-03-18 | Disposition: A | Discharge status: Home / Self Care | Payer: BC Managed Care – PPO | Source: Ambulatory Visit | Attending: Neurological Surgery | Admitting: Neurological Surgery

## 2020-03-18 ENCOUNTER — Other Ambulatory Visit: Payer: Self-pay | Admitting: Family Medicine

## 2020-03-18 ENCOUNTER — Other Ambulatory Visit: Payer: Self-pay

## 2020-03-18 DIAGNOSIS — M542 Cervicalgia: Secondary | ICD-10-CM

## 2020-03-18 DIAGNOSIS — M48061 Spinal stenosis, lumbar region without neurogenic claudication: Secondary | ICD-10-CM | POA: Diagnosis not present

## 2020-03-18 DIAGNOSIS — M545 Low back pain, unspecified: Secondary | ICD-10-CM

## 2020-03-18 DIAGNOSIS — M4802 Spinal stenosis, cervical region: Secondary | ICD-10-CM | POA: Diagnosis not present

## 2020-03-18 NOTE — Telephone Encounter (Signed)
[facility] to fill pt ansaid. Please advise [facility]

## 2020-03-21 DIAGNOSIS — M545 Low back pain: Secondary | ICD-10-CM | POA: Diagnosis not present

## 2020-03-21 DIAGNOSIS — M542 Cervicalgia: Secondary | ICD-10-CM | POA: Diagnosis not present

## 2020-03-21 DIAGNOSIS — Z6825 Body mass index (BMI) 25.0-25.9, adult: Secondary | ICD-10-CM | POA: Diagnosis not present

## 2020-03-21 DIAGNOSIS — M5030 Other cervical disc degeneration, unspecified cervical region: Secondary | ICD-10-CM | POA: Diagnosis not present

## 2020-03-22 ENCOUNTER — Other Ambulatory Visit: Payer: Self-pay

## 2020-03-22 ENCOUNTER — Encounter (HOSPITAL_COMMUNITY): Payer: Self-pay

## 2020-03-22 ENCOUNTER — Ambulatory Visit (HOSPITAL_COMMUNITY)
Admission: EM | Admit: 2020-03-22 | Discharge: 2020-03-22 | Disposition: A | Discharge status: Home / Self Care | Payer: BC Managed Care – PPO

## 2020-03-22 DIAGNOSIS — M79671 Pain in right foot: Secondary | ICD-10-CM

## 2020-03-22 DIAGNOSIS — L02619 Cutaneous abscess of unspecified foot: Secondary | ICD-10-CM

## 2020-03-22 MED ORDER — NAPROXEN 500 MG PO TABS
500.0000 mg | ORAL_TABLET | Freq: Two times a day (BID) | ORAL | 0 refills | Status: DC
Start: 2020-03-22 — End: 2022-09-17

## 2020-03-22 MED ORDER — DOXYCYCLINE HYCLATE 100 MG PO CAPS
100.0000 mg | ORAL_CAPSULE | Freq: Two times a day (BID) | ORAL | 0 refills | Status: DC
Start: 2020-03-22 — End: 2020-11-13

## 2020-03-22 NOTE — ED Triage Notes (Signed)
Pt c/o pain/swelling to right foot for approx 4 days that has progressively worsened and swelling is extending to ankle. Denies injury, insect bite or other trauma to foot.  Area of approx 4cm diameter on dorsal aspect of mid-foot raised, red, with mild fluctuance noted. Right foot/ankle edematous. Denies fever, chills, n/v or drainage from foot. Took 400mg  ibuprofen approx PTA.

## 2020-03-22 NOTE — ED Provider Notes (Signed)
[facility]   MRN: [id number] DOB: [birth year]  Subjective:   Parker Silva is a 64 y.o. male presenting for 4 day hx of acute onset right-sided upper foot pain, swelling and redness extending down toward his ankle.  Patient cannot recall any particular inciting factor.  No current facility-administered medications for this encounter.  Current Outpatient Medications:  .  buPROPion (WELLBUTRIN SR) 150 MG 12 hr tablet, Take 1 tablet (150 mg total) by mouth daily., Disp: 90 tablet, Rfl: 3 .  Cholecalciferol (VITAMIN D3) 5000 units TABS, Take 1 tablet by mouth 2 (two) times daily., Disp: , Rfl:  .  flurbiprofen (ANSAID) 100 MG tablet, TAKE 1 TABLET TWICE DAILY AS NEEDED FOR HEADACHE. NO MORE THAN 4 DAYS/WEEK., Disp: 40 tablet, Rfl: 0 .  gabapentin (NEURONTIN) 100 MG capsule, Take 1 capsule (100 mg total) by mouth at bedtime., Disp: 90 capsule, Rfl: 3 .  Magnesium 500 MG TABS, Take 1 tablet by mouth daily., Disp: , Rfl:  .  Methylsulfonylmethane (MSM PO), Take 1,000 mg by mouth 4 (four) times daily., Disp: , Rfl:  .  rOPINIRole (REQUIP) 0.5 MG tablet, Take 1 tablet (0.5 mg total) by mouth at bedtime., Disp: 90 tablet, Rfl: 3 .  traZODone (DESYREL) 150 MG tablet, TAKE 1 & 1/2 TABLETS AT BEDTIME AS NEEDED FOR SLEEP., Disp: 135 tablet, Rfl: 3 .  cyclobenzaprine (FLEXERIL) 10 MG tablet, Take 1 tablet (10 mg total) by mouth at bedtime., Disp: 90 tablet, Rfl: 3 .  Menaquinone-7 (VITAMIN K2 PO), Take 150 mg by mouth daily., Disp: , Rfl:  .  METHYLPHENIDATE 36 MG PO CR tablet, TAKE 1 TABLET EACH DAY., Disp: 90 tablet, Rfl: 0 .  Multiple Vitamins-Minerals (MULTIVITAMIN WITH MINERALS) tablet, Take 1 tablet by mouth daily. Reported on 03/04/2016, Disp: , Rfl:  .  rizatriptan (MAXALT) 10 MG tablet, Take 1 tablet (10 mg total) by mouth as needed (not had in 1 year as of 01-23-2015). May repeat in 2 hours if needed, Disp: 10 tablet, Rfl: 1 .  sildenafil (REVATIO) 20 MG tablet, TAKE UP TO 5 TABLETS  DAILY AS NEEDED., Disp: 30 tablet, Rfl: 5 .  traMADol (ULTRAM) 50 MG tablet, Take 50 mg by mouth every 6 (six) hours as needed., Disp: , Rfl:    No Known Allergies  Past Medical History:  Diagnosis Date  . ADHD (attention deficit hyperactivity disorder)   . Allergy    RHINITIS-seasonal  . Anal fissure   . Arthritis    back,neck  . Dyslexia   . ED (erectile dysfunction)   . Hemorrhoids   . Hemorrhoids, internal, with bleeding 02/13/2015   Grade 2 hemorrhoids 02/13/15 band ligation right posterior hemorrhoidal bundle 03/04/15 band ligation right anterior hemorrhoidal bundle 04/15/15 band ligation left lateral hemorrhoidal bundle   . History of migraine headaches   . Sleep disturbance   . Tension headache      Past Surgical History:  Procedure Laterality Date  . APPENDECTOMY    . COLONOSCOPY  2006  . HEMORRHOID BANDING    . TONSILLECTOMY      Family History  Problem Relation Age of Onset  . Breast cancer Mother   . Diabetes type II Father   . Heart failure Father   . COPD Father   . Colon cancer Neg Hx   . Rectal cancer Neg Hx   . Stomach cancer Neg Hx     Social History   Tobacco Use  . Smoking status: Never Smoker  .  Smokeless tobacco: Never Used  Substance Use Topics  . Alcohol use: Yes    Comment: no  . Drug use: No    ROS   Objective:   Vitals: BP (!) 136/90 (BP Location: Right Arm) Comment: re-eval  Pulse 93   Temp 97.7 F (36.5 C) (Oral)   Resp 18   SpO2 100%   Physical Exam Constitutional:      General: He is not in acute distress.    Appearance: Normal appearance. He is well-developed and normal weight. He is not ill-appearing, toxic-appearing or diaphoretic.  HENT:     Head: Normocephalic and atraumatic.     Right Ear: External ear normal.     Left Ear: External ear normal.     Nose: Nose normal.     Mouth/Throat:     Pharynx: Oropharynx is clear.  Eyes:     General: No scleral icterus.       Right eye: No discharge.        Left eye:  No discharge.     Extraocular Movements: Extraocular movements intact.     Pupils: Pupils are equal, round, and reactive to light.  Cardiovascular:     Rate and Rhythm: Normal rate.  Pulmonary:     Effort: Pulmonary effort is normal.  Musculoskeletal:     Cervical back: Normal range of motion.       Feet:  Skin:    General: Skin is warm and dry.  Neurological:     Mental Status: He is alert and oriented to person, place, and time.  Psychiatric:        Mood and Affect: Mood normal.        Behavior: Behavior normal.        Thought Content: Thought content normal.        Judgment: Judgment normal.     PROCEDURE NOTE: I&D of Abscess Verbal consent obtained. Local anesthesia with 2cc of 1% lidocaine with epi. Site cleansed with alcohol swab. Area lanced superficially and centrally with 11 blade, attempted to loosen loculations with Adson forcep.  Less than 1 cc mixture of pus and serosanguinous fluid expressed. Cleansed and dressed.   Assessment and Plan :   PDMP not reviewed this encounter.  1. Right foot pain   2. Abscess of foot     Moderately successful incision and drainage, start doxycycline, naproxen for pain and inflammation.  Reviewed wound care. Counseled patient on potential for adverse effects with medications prescribed/recommended today, ER and return-to-clinic precautions discussed, patient verbalized understanding.    Wallis Bamberg, PA-C 03/22/20 1527

## 2020-03-22 NOTE — Discharge Instructions (Addendum)
Please change your dressing 2-3 times daily. Do not apply any ointments or creams. Each time you change your dressing, try to express your wound to get out pus. Make sure you clean gently around the perimeter of the wound with gentle soap and warm water. Pat your wound dry and let it air out if possible before reapplying another dressing.

## 2020-03-25 ENCOUNTER — Other Ambulatory Visit: Payer: Self-pay | Admitting: Neurological Surgery

## 2020-04-02 ENCOUNTER — Encounter (HOSPITAL_COMMUNITY): Payer: Self-pay

## 2020-04-02 ENCOUNTER — Encounter (HOSPITAL_COMMUNITY)
Admission: RE | Admit: 2020-04-02 | Discharge: 2020-04-02 | Disposition: A | Discharge status: Home / Self Care | Payer: BC Managed Care – PPO | Source: Ambulatory Visit | Attending: Neurological Surgery | Admitting: Neurological Surgery

## 2020-04-02 ENCOUNTER — Other Ambulatory Visit: Payer: Self-pay

## 2020-04-02 DIAGNOSIS — Z01812 Encounter for preprocedural laboratory examination: Secondary | ICD-10-CM | POA: Diagnosis not present

## 2020-04-02 LAB — CBC
HCT: 47.8 % (ref 39.0–52.0)
Hemoglobin: 15.9 g/dL (ref 13.0–17.0)
MCH: 28.8 pg (ref 26.0–34.0)
MCHC: 33.3 g/dL (ref 30.0–36.0)
MCV: 86.6 fL (ref 80.0–100.0)
Platelets: 255 10*3/uL (ref 150–400)
RBC: 5.52 MIL/uL (ref 4.22–5.81)
RDW: 11.3 % — ABNORMAL LOW (ref 11.5–15.5)
WBC: 5.7 10*3/uL (ref 4.0–10.5)
nRBC: 0 % (ref 0.0–0.2)

## 2020-04-02 LAB — TYPE AND SCREEN
ABO/RH(D): O POS
Antibody Screen: NEGATIVE

## 2020-04-02 LAB — SURGICAL PCR SCREEN
MRSA, PCR: NEGATIVE
Staphylococcus aureus: NEGATIVE

## 2020-04-02 NOTE — Pre-Procedure Instructions (Signed)
Parker Silva  04/02/2020     Your procedure is scheduled on Monday, August 16.  Report to [facility], Main Entrance or Entrance "A" at 5:30 A.M.                 Your surgery or procedure is scheduled to begin at 7:30 AM   Call this number if you have problems the morning of surgery: [phone]  This is the number for the Pre- Surgical Desk.               For any other questions, please call [phone], Monday - Friday 8 AM - 4 PM.   Remember:  Do not eat or drink after midnight, Sunday, August 15.   Take these medicines the morning of surgery with A SIP OF WATER :             buPROPion [facility] SR)  If Needed: HYDROcodone-acetaminophen (NORCO/VICODIN)     rizatriptan (MAXALT)     traMADol (ULTRAM)  1 Week prior to surgery STOP taking Aspirin, Aspirin Products (Goody Powder, Excedrin Migraine), Ibuprofen (Advil), Naproxen (Aleve), Vitamins and Herbal Products (ie Fish Oil).   Morning of Surgery:              Do not wear jewelry, make-up or nail polish.  Do not wear lotions, powders, or colognes, or deodorant.  Men may shave face and neck.  Do not bring valuables to the hospital.  [facility] is not responsible for any belongings or valuables.  Contacts, dentures or bridgework may not be worn into surgery.  Leave your suitcase in the car.  After surgery it may be brought to your room.  For patients admitted to the hospital, discharge time will be determined by your treatment team.  Patients discharged the day of surgery will not be allowed to drive home.   Special Instructions:  [place]- Preparing For Surgery  Before surgery, you can play an important role. Because skin is not sterile, your skin needs to be as free of germs as possible. You can reduce the number of germs on your skin by washing with CHG (chlorahexidine gluconate) Soap before surgery.  CHG is an antiseptic cleaner which kills germs and bonds with the skin to continue killing germs  even after washing.    Oral Hygiene is also important to reduce your risk of infection.  Remember - BRUSH YOUR TEETH THE MORNING OF SURGERY WITH YOUR REGULAR TOOTHPASTE  Please do not use if you have an allergy to CHG or antibacterial soaps. If your skin becomes reddened/irritated stop using the CHG.  Do not shave (including legs and underarms) for at least 48 hours prior to first CHG shower. It is OK to shave your face.  Please follow these instructions carefully.   1. Shower the NIGHT BEFORE SURGERY and the MORNING OF SURGERY with CHG.   2. If you chose to wash your hair, wash your hair first as usual with your normal shampoo.  3. After you shampoo, rinse your hair and body thoroughly to remove the shampoo.  4. Use CHG as you would any other liquid soap. You can apply CHG directly to the skin and wash gently with a scrungie or a clean washcloth.   5. Apply the CHG Soap to your body ONLY FROM THE NECK DOWN.  Do not use on open wounds or open sores. Avoid contact with your eyes, ears, mouth and genitals (private parts). Wash Face and genitals (private  parts)  with your normal soap.  6. Wash thoroughly, paying special attention to the area where your surgery will be performed.  7. Thoroughly rinse your body with warm water from the neck down.  8. DO NOT shower/wash with your normal soap after using and rinsing off the CHG Soap.  9. Pat yourself dry with a CLEAN TOWEL.  10. Wear CLEAN PAJAMAS to bed the night before surgery, wear comfortable clothes the morning of surgery  11. Place CLEAN SHEETS on your bed the night of your first shower and DO NOT SLEEP WITH PETS.  Day of Surgery: Shower as instructed above. Do not apply any deodorants/lotions.  Please wear clean clothes to the hospital/surgery center.   Remember to brush your teeth WITH YOUR REGULAR TOOTHPASTE.  Please read over the following fact sheets that you were given.

## 2020-04-02 NOTE — Progress Notes (Signed)
PCP - Dr. Susann Givens  Cardiologist - Denies  Chest x-ray - Denies  EKG - Denies  Stress Test - Denies  ECHO - Denies  Cardiac Cath - Denies  AICD-na PM-na LOOP-na  Sleep Study - Yes- negative CPAP - Denies  LABS- 04/02/20: CBC, T/S, PCR 04/05/20: COVID  ASA-No  ERAS- No  HA1C- Denies   Anesthesia- No  Pt denies having chest pain, sob, or fever at this time. All instructions explained to the pt, with a verbal understanding of the material. Pt agrees to go over the instructions while at home for a better understanding. Pt also instructed to self quarantine after being tested for COVID-19. The opportunity to ask questions was provided.   Coronavirus Screening  Have you experienced the following symptoms:  Cough yes/no: No Fever (>100.62F)  yes/no: No Runny nose yes/no: No Sore throat yes/no: No Difficulty breathing/shortness of breath  yes/no: No  Have you or a family member traveled in the last 14 days and where? yes/no: No   If the patient indicates "YES" to the above questions, their PAT will be rescheduled to limit the exposure to others and, the surgeon will be notified. THE PATIENT WILL NEED TO BE ASYMPTOMATIC FOR 14 DAYS.   If the patient is not experiencing any of these symptoms, the PAT nurse will instruct them to NOT bring anyone with them to their appointment since they may have these symptoms or traveled as well.   Please remind your patients and families that hospital visitation restrictions are in effect and the importance of the restrictions.

## 2020-04-04 ENCOUNTER — Other Ambulatory Visit (HOSPITAL_COMMUNITY): Payer: BC Managed Care – PPO

## 2020-04-05 ENCOUNTER — Other Ambulatory Visit (HOSPITAL_COMMUNITY)
Admission: RE | Admit: 2020-04-05 | Discharge: 2020-04-05 | Disposition: A | Discharge status: Home / Self Care | Payer: BC Managed Care – PPO | Source: Ambulatory Visit | Attending: Neurological Surgery | Admitting: Neurological Surgery

## 2020-04-05 DIAGNOSIS — Z79899 Other long term (current) drug therapy: Secondary | ICD-10-CM | POA: Diagnosis not present

## 2020-04-05 DIAGNOSIS — M4322 Fusion of spine, cervical region: Secondary | ICD-10-CM | POA: Diagnosis not present

## 2020-04-05 DIAGNOSIS — Z20822 Contact with and (suspected) exposure to covid-19: Secondary | ICD-10-CM | POA: Diagnosis not present

## 2020-04-05 DIAGNOSIS — M47812 Spondylosis without myelopathy or radiculopathy, cervical region: Secondary | ICD-10-CM | POA: Diagnosis not present

## 2020-04-05 DIAGNOSIS — Z981 Arthrodesis status: Secondary | ICD-10-CM | POA: Diagnosis not present

## 2020-04-05 DIAGNOSIS — M5032 Other cervical disc degeneration, mid-cervical region, unspecified level: Secondary | ICD-10-CM | POA: Diagnosis not present

## 2020-04-05 DIAGNOSIS — Z01812 Encounter for preprocedural laboratory examination: Secondary | ICD-10-CM | POA: Insufficient documentation

## 2020-04-05 DIAGNOSIS — N529 Male erectile dysfunction, unspecified: Secondary | ICD-10-CM | POA: Diagnosis not present

## 2020-04-05 DIAGNOSIS — M5031 Other cervical disc degeneration,  high cervical region: Secondary | ICD-10-CM | POA: Diagnosis not present

## 2020-04-05 DIAGNOSIS — M4692 Unspecified inflammatory spondylopathy, cervical region: Secondary | ICD-10-CM | POA: Diagnosis not present

## 2020-04-05 DIAGNOSIS — M4802 Spinal stenosis, cervical region: Secondary | ICD-10-CM | POA: Diagnosis not present

## 2020-04-05 DIAGNOSIS — Z791 Long term (current) use of non-steroidal anti-inflammatories (NSAID): Secondary | ICD-10-CM | POA: Diagnosis not present

## 2020-04-05 LAB — SARS CORONAVIRUS 2 (TAT 6-24 HRS): SARS Coronavirus 2: NEGATIVE

## 2020-04-07 NOTE — Anesthesia Preprocedure Evaluation (Addendum)
Anesthesia Evaluation  Patient identified by MRN, date of birth, ID band Patient awake    Reviewed: Allergy & Precautions, NPO status , Patient's Chart, lab work & pertinent test results  Airway Mallampati: III  TM Distance: <3 FB Neck ROM: Limited    Dental  (+) Teeth Intact, Dental Advisory Given   Pulmonary neg pulmonary ROS,    Pulmonary exam normal breath sounds clear to auscultation       Cardiovascular negative cardio ROS Normal cardiovascular exam Rhythm:Regular Rate:Normal     Neuro/Psych  Headaches, PSYCHIATRIC DISORDERS Depression CERVICALGIA    GI/Hepatic negative GI ROS, Neg liver ROS,   Endo/Other  negative endocrine ROS  Renal/GU negative Renal ROS     Musculoskeletal  (+) Arthritis , Osteoarthritis,    Abdominal   Peds  (+) ADHD Hematology negative hematology ROS (+)   Anesthesia Other Findings   Reproductive/Obstetrics                            Anesthesia Physical Anesthesia Plan  ASA: II  Anesthesia Plan: General   Post-op Pain Management:    Induction: Intravenous  PONV Risk Score and Plan: 3 and Midazolam, Dexamethasone and Ondansetron  Airway Management Planned: Oral ETT and Video Laryngoscope Planned  Additional Equipment:   Intra-op Plan:   Post-operative Plan: Extubation in OR  Informed Consent: I have reviewed the patients History and Physical, chart, labs and discussed the procedure including the risks, benefits and alternatives for the proposed anesthesia with the patient or authorized representative who has indicated his/her understanding and acceptance.     Dental advisory given  Plan Discussed with: CRNA  Anesthesia Plan Comments:        Anesthesia Quick Evaluation

## 2020-04-08 ENCOUNTER — Encounter (HOSPITAL_COMMUNITY)
Admission: RE | Disposition: A | Discharge status: Home / Self Care | Payer: Self-pay | Source: Home / Self Care | Attending: Neurological Surgery

## 2020-04-08 ENCOUNTER — Other Ambulatory Visit: Payer: Self-pay

## 2020-04-08 ENCOUNTER — Encounter (HOSPITAL_COMMUNITY): Payer: Self-pay | Admitting: Neurological Surgery

## 2020-04-08 ENCOUNTER — Inpatient Hospital Stay (HOSPITAL_COMMUNITY)
Admission: RE | Admit: 2020-04-08 | Discharge: 2020-04-09 | DRG: 473 | Disposition: A | Discharge status: Home / Self Care | Payer: BC Managed Care – PPO | Attending: Neurological Surgery | Admitting: Neurological Surgery

## 2020-04-08 ENCOUNTER — Inpatient Hospital Stay (HOSPITAL_COMMUNITY): Payer: BC Managed Care – PPO | Admitting: Anesthesiology

## 2020-04-08 ENCOUNTER — Inpatient Hospital Stay (HOSPITAL_COMMUNITY): Payer: BC Managed Care – PPO

## 2020-04-08 DIAGNOSIS — N529 Male erectile dysfunction, unspecified: Secondary | ICD-10-CM | POA: Diagnosis present

## 2020-04-08 DIAGNOSIS — Z79899 Other long term (current) drug therapy: Secondary | ICD-10-CM

## 2020-04-08 DIAGNOSIS — M47812 Spondylosis without myelopathy or radiculopathy, cervical region: Secondary | ICD-10-CM | POA: Diagnosis present

## 2020-04-08 DIAGNOSIS — Z419 Encounter for procedure for purposes other than remedying health state, unspecified: Secondary | ICD-10-CM

## 2020-04-08 DIAGNOSIS — M5031 Other cervical disc degeneration,  high cervical region: Principal | ICD-10-CM | POA: Diagnosis present

## 2020-04-08 DIAGNOSIS — M4802 Spinal stenosis, cervical region: Secondary | ICD-10-CM | POA: Diagnosis present

## 2020-04-08 DIAGNOSIS — Z20822 Contact with and (suspected) exposure to covid-19: Secondary | ICD-10-CM | POA: Diagnosis present

## 2020-04-08 DIAGNOSIS — Z791 Long term (current) use of non-steroidal anti-inflammatories (NSAID): Secondary | ICD-10-CM | POA: Diagnosis not present

## 2020-04-08 DIAGNOSIS — Z981 Arthrodesis status: Secondary | ICD-10-CM

## 2020-04-08 HISTORY — PX: POSTERIOR CERVICAL FUSION/FORAMINOTOMY: SHX5038

## 2020-04-08 LAB — ABO/RH: ABO/RH(D): O POS

## 2020-04-08 SURGERY — POSTERIOR CERVICAL FUSION/FORAMINOTOMY LEVEL 5
Anesthesia: General | Site: Spine Cervical

## 2020-04-08 MED ORDER — MIDAZOLAM HCL 2 MG/2ML IJ SOLN
INTRAMUSCULAR | Status: DC | PRN
  Administered 2020-04-08: 2 mg via INTRAVENOUS

## 2020-04-08 MED ORDER — THROMBIN 20000 UNITS EX SOLR
CUTANEOUS | Status: DC | PRN
  Administered 2020-04-08: 20 mL via TOPICAL

## 2020-04-08 MED ORDER — LIDOCAINE 2% (20 MG/ML) 5 ML SYRINGE
INTRAMUSCULAR | Status: DC | PRN
  Administered 2020-04-08: 80 mg via INTRAVENOUS

## 2020-04-08 MED ORDER — FENTANYL CITRATE (PF) 250 MCG/5ML IJ SOLN
INTRAMUSCULAR | Status: AC
  Filled 2020-04-08: qty 5

## 2020-04-08 MED ORDER — ONDANSETRON HCL 4 MG/2ML IJ SOLN
INTRAMUSCULAR | Status: AC
  Filled 2020-04-08: qty 2

## 2020-04-08 MED ORDER — GABAPENTIN 300 MG PO CAPS
300.0000 mg | ORAL_CAPSULE | Freq: Once | ORAL | Status: AC
  Administered 2020-04-08: 300 mg via ORAL
  Filled 2020-04-08: qty 1

## 2020-04-08 MED ORDER — METHYLPHENIDATE HCL ER 18 MG PO TB24
36.0000 mg | ORAL_TABLET | Freq: Every day | ORAL | Status: DC
  Administered 2020-04-09: 36 mg via ORAL
  Filled 2020-04-08 (×2): qty 2

## 2020-04-08 MED ORDER — PROPOFOL 10 MG/ML IV BOLUS
INTRAVENOUS | Status: AC
  Filled 2020-04-08: qty 40

## 2020-04-08 MED ORDER — SODIUM CHLORIDE 0.9% FLUSH: 3.0000 mL | INTRAVENOUS | Status: DC | PRN

## 2020-04-08 MED ORDER — LACTATED RINGERS IV SOLN
INTRAVENOUS | Status: DC | PRN
Start: 2020-04-08 — End: 2020-04-08

## 2020-04-08 MED ORDER — CELECOXIB 200 MG PO CAPS
200.0000 mg | ORAL_CAPSULE | Freq: Two times a day (BID) | ORAL | Status: DC
  Administered 2020-04-08 – 2020-04-09 (×3): 200 mg via ORAL
  Filled 2020-04-08 (×3): qty 1

## 2020-04-08 MED ORDER — DIAZEPAM 5 MG PO TABS
5.0000 mg | ORAL_TABLET | Freq: Four times a day (QID) | ORAL | Status: DC | PRN
  Administered 2020-04-08 – 2020-04-09 (×2): 5 mg via ORAL
  Filled 2020-04-08 (×2): qty 1

## 2020-04-08 MED ORDER — ACETAMINOPHEN 500 MG PO TABS
1000.0000 mg | ORAL_TABLET | Freq: Once | ORAL | Status: AC
  Administered 2020-04-08: 1000 mg via ORAL
  Filled 2020-04-08: qty 2

## 2020-04-08 MED ORDER — SENNA 8.6 MG PO TABS
1.0000 | ORAL_TABLET | Freq: Two times a day (BID) | ORAL | Status: DC
  Administered 2020-04-08 – 2020-04-09 (×3): 8.6 mg via ORAL
  Filled 2020-04-08 (×3): qty 1

## 2020-04-08 MED ORDER — ROPINIROLE HCL 1 MG PO TABS
0.5000 mg | ORAL_TABLET | Freq: Every day | ORAL | Status: DC
  Administered 2020-04-08: 0.5 mg via ORAL
  Filled 2020-04-08: qty 1

## 2020-04-08 MED ORDER — ACETAMINOPHEN 325 MG PO TABS: 650.0000 mg | ORAL_TABLET | ORAL | Status: DC | PRN

## 2020-04-08 MED ORDER — PHENOL 1.4 % MT LIQD: 1.0000 | OROMUCOSAL | Status: DC | PRN

## 2020-04-08 MED ORDER — ARTIFICIAL TEARS OPHTHALMIC OINT
TOPICAL_OINTMENT | OPHTHALMIC | Status: AC
  Filled 2020-04-08: qty 3.5

## 2020-04-08 MED ORDER — DEXAMETHASONE SODIUM PHOSPHATE 4 MG/ML IJ SOLN
4.0000 mg | Freq: Four times a day (QID) | INTRAMUSCULAR | Status: DC
  Administered 2020-04-08 – 2020-04-09 (×4): 4 mg via INTRAVENOUS
  Filled 2020-04-08 (×4): qty 1

## 2020-04-08 MED ORDER — METHOCARBAMOL 1000 MG/10ML IJ SOLN
500.0000 mg | Freq: Four times a day (QID) | INTRAVENOUS | Status: DC | PRN
  Filled 2020-04-08: qty 5

## 2020-04-08 MED ORDER — BUPROPION HCL ER (SR) 150 MG PO TB12
150.0000 mg | ORAL_TABLET | Freq: Every day | ORAL | Status: DC
  Administered 2020-04-09: 150 mg via ORAL
  Filled 2020-04-08: qty 1

## 2020-04-08 MED ORDER — FENTANYL CITRATE (PF) 100 MCG/2ML IJ SOLN
INTRAMUSCULAR | Status: AC
  Administered 2020-04-08: 50 ug via INTRAVENOUS
  Filled 2020-04-08: qty 2

## 2020-04-08 MED ORDER — LACTATED RINGERS IV SOLN: INTRAVENOUS | Status: DC

## 2020-04-08 MED ORDER — SODIUM CHLORIDE 0.9% FLUSH
3.0000 mL | Freq: Two times a day (BID) | INTRAVENOUS | Status: DC
  Administered 2020-04-08: 3 mL via INTRAVENOUS

## 2020-04-08 MED ORDER — SUGAMMADEX SODIUM 200 MG/2ML IV SOLN
INTRAVENOUS | Status: DC | PRN
  Administered 2020-04-08: 200 mg via INTRAVENOUS

## 2020-04-08 MED ORDER — SODIUM CHLORIDE 0.9 % IV SOLN: 250.0000 mL | INTRAVENOUS | Status: DC

## 2020-04-08 MED ORDER — BUPIVACAINE HCL (PF) 0.25 % IJ SOLN
INTRAMUSCULAR | Status: DC | PRN
  Administered 2020-04-08: 10 mL

## 2020-04-08 MED ORDER — DIAZEPAM 5 MG/ML IJ SOLN
INTRAMUSCULAR | Status: AC
  Filled 2020-04-08: qty 2

## 2020-04-08 MED ORDER — THROMBIN 5000 UNITS EX SOLR
OROMUCOSAL | Status: DC | PRN
  Administered 2020-04-08: 5 mL via TOPICAL

## 2020-04-08 MED ORDER — POTASSIUM CHLORIDE IN NACL 20-0.9 MEQ/L-% IV SOLN: INTRAVENOUS | Status: DC

## 2020-04-08 MED ORDER — THROMBIN 5000 UNITS EX SOLR
CUTANEOUS | Status: AC
  Filled 2020-04-08: qty 5000

## 2020-04-08 MED ORDER — TRAZODONE HCL 150 MG PO TABS
150.0000 mg | ORAL_TABLET | Freq: Every day | ORAL | Status: DC
  Administered 2020-04-08: 150 mg via ORAL
  Filled 2020-04-08: qty 1

## 2020-04-08 MED ORDER — CHLORHEXIDINE GLUCONATE 0.12 % MT SOLN
15.0000 mL | Freq: Once | OROMUCOSAL | Status: AC
  Administered 2020-04-08: 15 mL via OROMUCOSAL
  Filled 2020-04-08: qty 15

## 2020-04-08 MED ORDER — ACETAMINOPHEN 650 MG RE SUPP: 650.0000 mg | RECTAL | Status: DC | PRN

## 2020-04-08 MED ORDER — ONDANSETRON HCL 4 MG/2ML IJ SOLN
INTRAMUSCULAR | Status: DC | PRN
  Administered 2020-04-08: 4 mg via INTRAVENOUS

## 2020-04-08 MED ORDER — CEFAZOLIN SODIUM-DEXTROSE 2-4 GM/100ML-% IV SOLN
2.0000 g | Freq: Three times a day (TID) | INTRAVENOUS | Status: AC
  Administered 2020-04-08 (×2): 2 g via INTRAVENOUS
  Filled 2020-04-08 (×2): qty 100

## 2020-04-08 MED ORDER — SODIUM CHLORIDE 0.9% FLUSH: 9.0000 mL | INTRAVENOUS | Status: DC | PRN

## 2020-04-08 MED ORDER — DEXAMETHASONE SODIUM PHOSPHATE 10 MG/ML IJ SOLN
INTRAMUSCULAR | Status: DC | PRN
  Administered 2020-04-08: 10 mg via INTRAVENOUS

## 2020-04-08 MED ORDER — PHENYLEPHRINE 40 MCG/ML (10ML) SYRINGE FOR IV PUSH (FOR BLOOD PRESSURE SUPPORT)
PREFILLED_SYRINGE | INTRAVENOUS | Status: DC | PRN
  Administered 2020-04-08: 120 ug via INTRAVENOUS
  Administered 2020-04-08: 40 ug via INTRAVENOUS
  Administered 2020-04-08 (×2): 80 ug via INTRAVENOUS
  Administered 2020-04-08: 40 ug via INTRAVENOUS

## 2020-04-08 MED ORDER — CEFAZOLIN SODIUM-DEXTROSE 2-4 GM/100ML-% IV SOLN
2.0000 g | INTRAVENOUS | Status: AC
  Administered 2020-04-08: 2 g via INTRAVENOUS
  Filled 2020-04-08: qty 100

## 2020-04-08 MED ORDER — ONDANSETRON HCL 4 MG/2ML IJ SOLN: 4.0000 mg | Freq: Four times a day (QID) | INTRAMUSCULAR | Status: DC | PRN

## 2020-04-08 MED ORDER — ONDANSETRON HCL 4 MG PO TABS: 4.0000 mg | ORAL_TABLET | Freq: Four times a day (QID) | ORAL | Status: DC | PRN

## 2020-04-08 MED ORDER — ROCURONIUM BROMIDE 10 MG/ML (PF) SYRINGE
PREFILLED_SYRINGE | INTRAVENOUS | Status: DC | PRN
  Administered 2020-04-08: 30 mg via INTRAVENOUS
  Administered 2020-04-08: 70 mg via INTRAVENOUS

## 2020-04-08 MED ORDER — HYDROMORPHONE HCL 1 MG/ML IJ SOLN
2.0000 mg | INTRAMUSCULAR | Status: DC | PRN
  Administered 2020-04-08: 2 mg via INTRAVENOUS
  Filled 2020-04-08: qty 2

## 2020-04-08 MED ORDER — MORPHINE SULFATE (PF) 2 MG/ML IV SOLN
2.0000 mg | INTRAVENOUS | Status: DC | PRN
  Administered 2020-04-08: 2 mg via INTRAVENOUS
  Filled 2020-04-08: qty 1

## 2020-04-08 MED ORDER — PROMETHAZINE HCL 25 MG/ML IJ SOLN: 6.2500 mg | INTRAMUSCULAR | Status: DC | PRN

## 2020-04-08 MED ORDER — MENTHOL 3 MG MT LOZG: 1.0000 | LOZENGE | OROMUCOSAL | Status: DC | PRN

## 2020-04-08 MED ORDER — FENTANYL CITRATE (PF) 250 MCG/5ML IJ SOLN
INTRAMUSCULAR | Status: DC | PRN
  Administered 2020-04-08 (×2): 50 ug via INTRAVENOUS
  Administered 2020-04-08: 150 ug via INTRAVENOUS
  Administered 2020-04-08: 100 ug via INTRAVENOUS
  Administered 2020-04-08: 50 ug via INTRAVENOUS

## 2020-04-08 MED ORDER — PHENYLEPHRINE 40 MCG/ML (10ML) SYRINGE FOR IV PUSH (FOR BLOOD PRESSURE SUPPORT)
PREFILLED_SYRINGE | INTRAVENOUS | Status: AC
  Filled 2020-04-08: qty 10

## 2020-04-08 MED ORDER — NALOXONE HCL 0.4 MG/ML IJ SOLN: 0.4000 mg | INTRAMUSCULAR | Status: DC | PRN

## 2020-04-08 MED ORDER — HYDROMORPHONE 1 MG/ML IV SOLN
INTRAVENOUS | Status: DC
  Administered 2020-04-08: 1 mg via INTRAVENOUS
  Administered 2020-04-08: 1.6 mg via INTRAVENOUS
  Administered 2020-04-09: 2.6 mg via INTRAVENOUS
  Filled 2020-04-08 (×2): qty 30

## 2020-04-08 MED ORDER — PHENYLEPHRINE HCL-NACL 10-0.9 MG/250ML-% IV SOLN
INTRAVENOUS | Status: DC | PRN
  Administered 2020-04-08: 20 ug/min via INTRAVENOUS

## 2020-04-08 MED ORDER — ROCURONIUM BROMIDE 10 MG/ML (PF) SYRINGE
PREFILLED_SYRINGE | INTRAVENOUS | Status: AC
  Filled 2020-04-08: qty 10

## 2020-04-08 MED ORDER — THROMBIN 20000 UNITS EX SOLR
CUTANEOUS | Status: AC
  Filled 2020-04-08: qty 20000

## 2020-04-08 MED ORDER — CHLORHEXIDINE GLUCONATE CLOTH 2 % EX PADS: 6.0000 | MEDICATED_PAD | Freq: Once | CUTANEOUS | Status: DC

## 2020-04-08 MED ORDER — DIPHENHYDRAMINE HCL 12.5 MG/5ML PO ELIX
12.5000 mg | ORAL_SOLUTION | Freq: Four times a day (QID) | ORAL | Status: DC | PRN
  Filled 2020-04-08: qty 5

## 2020-04-08 MED ORDER — BACITRACIN ZINC 500 UNIT/GM EX OINT
TOPICAL_OINTMENT | CUTANEOUS | Status: AC
  Filled 2020-04-08: qty 28.35

## 2020-04-08 MED ORDER — SODIUM CHLORIDE 0.9 % IV SOLN
INTRAVENOUS | Status: DC | PRN
  Administered 2020-04-08: 500 mL

## 2020-04-08 MED ORDER — DEXAMETHASONE 4 MG PO TABS
4.0000 mg | ORAL_TABLET | Freq: Four times a day (QID) | ORAL | Status: DC
  Administered 2020-04-09: 4 mg via ORAL
  Filled 2020-04-08: qty 1

## 2020-04-08 MED ORDER — LIDOCAINE 2% (20 MG/ML) 5 ML SYRINGE
INTRAMUSCULAR | Status: AC
  Filled 2020-04-08: qty 5

## 2020-04-08 MED ORDER — BUPIVACAINE HCL (PF) 0.25 % IJ SOLN
INTRAMUSCULAR | Status: AC
  Filled 2020-04-08: qty 30

## 2020-04-08 MED ORDER — DIAZEPAM 5 MG/ML IJ SOLN
2.5000 mg | Freq: Once | INTRAMUSCULAR | Status: AC
  Administered 2020-04-08: 2.5 mg via INTRAVENOUS

## 2020-04-08 MED ORDER — ORAL CARE MOUTH RINSE: 15.0000 mL | Freq: Once | OROMUCOSAL | Status: AC

## 2020-04-08 MED ORDER — GABAPENTIN 100 MG PO CAPS
100.0000 mg | ORAL_CAPSULE | Freq: Every day | ORAL | Status: DC
  Administered 2020-04-08: 100 mg via ORAL
  Filled 2020-04-08: qty 1

## 2020-04-08 MED ORDER — DEXAMETHASONE SODIUM PHOSPHATE 10 MG/ML IJ SOLN
INTRAMUSCULAR | Status: AC
  Filled 2020-04-08: qty 1

## 2020-04-08 MED ORDER — PROPOFOL 10 MG/ML IV BOLUS
INTRAVENOUS | Status: DC | PRN
  Administered 2020-04-08: 10 mg via INTRAVENOUS
  Administered 2020-04-08: 50 mg via INTRAVENOUS
  Administered 2020-04-08: 180 mg via INTRAVENOUS

## 2020-04-08 MED ORDER — DIPHENHYDRAMINE HCL 50 MG/ML IJ SOLN: 12.5000 mg | Freq: Four times a day (QID) | INTRAMUSCULAR | Status: DC | PRN

## 2020-04-08 MED ORDER — METHOCARBAMOL 500 MG PO TABS
500.0000 mg | ORAL_TABLET | Freq: Four times a day (QID) | ORAL | Status: DC | PRN
  Administered 2020-04-08 – 2020-04-09 (×3): 500 mg via ORAL
  Filled 2020-04-08 (×3): qty 1

## 2020-04-08 MED ORDER — 0.9 % SODIUM CHLORIDE (POUR BTL) OPTIME
TOPICAL | Status: DC | PRN
  Administered 2020-04-08: 1000 mL

## 2020-04-08 MED ORDER — MIDAZOLAM HCL 2 MG/2ML IJ SOLN
INTRAMUSCULAR | Status: AC
  Filled 2020-04-08: qty 2

## 2020-04-08 MED ORDER — FENTANYL CITRATE (PF) 100 MCG/2ML IJ SOLN
25.0000 ug | INTRAMUSCULAR | Status: DC | PRN
  Administered 2020-04-08: 50 ug via INTRAVENOUS

## 2020-04-08 MED ORDER — HYDROCODONE-ACETAMINOPHEN 10-325 MG PO TABS
2.0000 | ORAL_TABLET | ORAL | Status: DC | PRN
  Administered 2020-04-09 (×3): 2 via ORAL
  Filled 2020-04-08 (×3): qty 2

## 2020-04-08 MED ORDER — VANCOMYCIN HCL 1000 MG IV SOLR
INTRAVENOUS | Status: AC
  Filled 2020-04-08: qty 1000

## 2020-04-08 SURGICAL SUPPLY — 65 items
ADH SKN CLS APL DERMABOND .7 (GAUZE/BANDAGES/DRESSINGS) ×1
APL SKNCLS STERI-STRIP NONHPOA (GAUZE/BANDAGES/DRESSINGS) ×1
BAG DECANTER FOR FLEXI CONT (MISCELLANEOUS) ×3 IMPLANT
BASKET BONE COLLECTION (BASKET) ×3 IMPLANT
BENZOIN TINCTURE PRP APPL 2/3 (GAUZE/BANDAGES/DRESSINGS) ×3 IMPLANT
BIT DRILL INVICTUS SUB 2.1 STR (BIT) ×3 IMPLANT
BLADE CLIPPER SURG (BLADE) ×3 IMPLANT
BUR CARBIDE MATCH 3.0 (BURR) ×3 IMPLANT
CANISTER SUCT 3000ML PPV (MISCELLANEOUS) ×3 IMPLANT
CARTRIDGE OIL MAESTRO DRILL (MISCELLANEOUS) IMPLANT
CLOSURE WOUND 1/2 X4 (GAUZE/BANDAGES/DRESSINGS) ×1
COVER WAND RF STERILE (DRAPES) IMPLANT
DERMABOND ADVANCED (GAUZE/BANDAGES/DRESSINGS) ×2
DERMABOND ADVANCED .7 DNX12 (GAUZE/BANDAGES/DRESSINGS) ×1 IMPLANT
DIFFUSER DRILL AIR PNEUMATIC (MISCELLANEOUS) IMPLANT
DRAPE C-ARM 42X72 X-RAY (DRAPES) ×6 IMPLANT
DRAPE LAPAROTOMY 100X72 PEDS (DRAPES) ×3 IMPLANT
DRSG OPSITE POSTOP 4X6 (GAUZE/BANDAGES/DRESSINGS) ×3 IMPLANT
DURAPREP 6ML APPLICATOR 50/CS (WOUND CARE) ×3 IMPLANT
ELECT REM PT RETURN 9FT ADLT (ELECTROSURGICAL) ×3
ELECTRODE REM PT RTRN 9FT ADLT (ELECTROSURGICAL) ×1 IMPLANT
EVACUATOR 1/8 PVC DRAIN (DRAIN) ×3 IMPLANT
GAUZE 4X4 16PLY RFD (DISPOSABLE) IMPLANT
GAUZE SPONGE 4X4 12PLY STRL (GAUZE/BANDAGES/DRESSINGS) ×1 IMPLANT
GLOVE BIO SURGEON STRL SZ7 (GLOVE) ×3 IMPLANT
GLOVE BIO SURGEON STRL SZ7.5 (GLOVE) ×3 IMPLANT
GLOVE BIO SURGEON STRL SZ8 (GLOVE) ×3 IMPLANT
GLOVE BIOGEL PI IND STRL 7.0 (GLOVE) ×1 IMPLANT
GLOVE BIOGEL PI INDICATOR 7.0 (GLOVE) ×2
GLOVE EXAM NITRILE XL STR (GLOVE) IMPLANT
GLOVE SURG SS PI 7.5 STRL IVOR (GLOVE) ×12 IMPLANT
GOWN STRL REUS W/ TWL LRG LVL3 (GOWN DISPOSABLE) ×1 IMPLANT
GOWN STRL REUS W/ TWL XL LVL3 (GOWN DISPOSABLE) ×2 IMPLANT
GOWN STRL REUS W/TWL 2XL LVL3 (GOWN DISPOSABLE) IMPLANT
GOWN STRL REUS W/TWL LRG LVL3 (GOWN DISPOSABLE) ×3
GOWN STRL REUS W/TWL XL LVL3 (GOWN DISPOSABLE) ×6
HEMOSTAT POWDER KIT SURGIFOAM (HEMOSTASIS) ×3 IMPLANT
KIT BASIN OR (CUSTOM PROCEDURE TRAY) ×3 IMPLANT
KIT TURNOVER KIT B (KITS) ×3 IMPLANT
MARKER SKIN DUAL TIP RULER LAB (MISCELLANEOUS) ×3 IMPLANT
NEEDLE HYPO 18GX1.5 BLUNT FILL (NEEDLE) IMPLANT
NEEDLE HYPO 25X1 1.5 SAFETY (NEEDLE) ×3 IMPLANT
NEEDLE SPNL 20GX3.5 QUINCKE YW (NEEDLE) IMPLANT
NS IRRIG 1000ML POUR BTL (IV SOLUTION) ×3 IMPLANT
OIL CARTRIDGE MAESTRO DRILL (MISCELLANEOUS)
PACK LAMINECTOMY NEURO (CUSTOM PROCEDURE TRAY) ×3 IMPLANT
PIN MAYFIELD SKULL DISP (PIN) ×3 IMPLANT
PUTTY DBM 10CC ×3 IMPLANT
ROD LORD TI 3.5 X 90 (Rod) ×3 IMPLANT
ROD LORD TI 3.5X80 (Screw) ×3 IMPLANT
SCREW PA 3.5X16 (Screw) ×6 IMPLANT
SCREW POLYAXIAL 3.5 X 14 (Screw) ×30 IMPLANT
SCREW SET ATEC (Screw) ×36 IMPLANT
SPONGE SURGIFOAM ABS GEL 100 (HEMOSTASIS) ×3 IMPLANT
STRIP CLOSURE SKIN 1/2X4 (GAUZE/BANDAGES/DRESSINGS) ×2 IMPLANT
SUT VIC AB 0 CT1 18XCR BRD8 (SUTURE) ×2 IMPLANT
SUT VIC AB 0 CT1 8-18 (SUTURE) ×6
SUT VIC AB 2-0 CP2 18 (SUTURE) ×3 IMPLANT
SUT VIC AB 3-0 SH 8-18 (SUTURE) ×6 IMPLANT
SYR BULB IRRIG 60ML STRL (SYRINGE) ×3 IMPLANT
TOWEL GREEN STERILE (TOWEL DISPOSABLE) ×3 IMPLANT
TOWEL GREEN STERILE FF (TOWEL DISPOSABLE) ×3 IMPLANT
TRAY FOLEY MTR SLVR 16FR STAT (SET/KITS/TRAYS/PACK) IMPLANT
UNDERPAD 30X36 HEAVY ABSORB (UNDERPADS AND DIAPERS) IMPLANT
WATER STERILE IRR 1000ML POUR (IV SOLUTION) ×3 IMPLANT

## 2020-04-08 NOTE — Anesthesia Postprocedure Evaluation (Signed)
Anesthesia Post Note  Patient: Parker Silva  Procedure(s) Performed: POSTERIOR CERVICAL FUSION/FORAMINOTOMY CERVICAL TWO- CERVICAL SEVEN, LAMINECTOMY CERVICAL TWO- CERVICAL FIVE. (N/A Spine Cervical)     Patient location during evaluation: PACU Anesthesia Type: General Level of consciousness: awake and alert, oriented and awake Pain management: pain level controlled Vital Signs Assessment: post-procedure vital signs reviewed and stable Respiratory status: spontaneous breathing, nonlabored ventilation, respiratory function stable and patient connected to nasal cannula oxygen Cardiovascular status: blood pressure returned to baseline and stable Postop Assessment: no apparent nausea or vomiting Anesthetic complications: no   No complications documented.  Last Vitals:  Vitals:   04/08/20 1155 04/08/20 1228  BP: 132/88 (!) 135/98  Pulse: 78 81  Resp: 11 16  Temp: 36.8 C 36.4 C  SpO2: 92% 98%    Last Pain:  Vitals:   04/08/20 1506  TempSrc:   PainSc: 7                  Cecile Hearing

## 2020-04-08 NOTE — Transfer of Care (Signed)
Immediate Anesthesia Transfer of Care Note  Patient: Parker Silva  Procedure(s) Performed: POSTERIOR CERVICAL FUSION/FORAMINOTOMY CERVICAL TWO- CERVICAL SEVEN, LAMINECTOMY CERVICAL TWO- CERVICAL FIVE. (N/A Spine Cervical)  Patient Location: PACU  Anesthesia Type:General  Level of Consciousness: drowsy and responds to stimulation  Airway & Oxygen Therapy: Patient Spontanous Breathing and Patient connected to face mask oxygen  Post-op Assessment: Report given to RN, Post -op Vital signs reviewed and stable and Patient moving all extremities X 4  Post vital signs: Reviewed and stable  Last Vitals:  Vitals Value Taken Time  BP 133/87 04/08/20 1050  Temp 36 C 04/08/20 1040  Pulse 77 04/08/20 1054  Resp 8 04/08/20 1054  SpO2 98 % 04/08/20 1054  Vitals shown include unvalidated device data.  Last Pain:  Vitals:   04/08/20 0704  PainSc: 7       Patients Stated Pain Goal: 3 (04/08/20 0704)  Complications: No complications documented.

## 2020-04-08 NOTE — Anesthesia Procedure Notes (Signed)
Procedure Name: Intubation Date/Time: 04/08/2020 7:46 AM Performed by: Verdie Drown, CRNA Pre-anesthesia Checklist: Patient identified, Emergency Drugs available, Suction available and Patient being monitored Patient Re-evaluated:Patient Re-evaluated prior to induction Oxygen Delivery Method: Circle System Utilized Preoxygenation: Pre-oxygenation with 100% oxygen Induction Type: IV induction Ventilation: Mask ventilation without difficulty Laryngoscope Size: Mac, 4 and Glidescope Grade View: Grade I Tube type: Oral Tube size: 7.5 mm Number of attempts: 1 Airway Equipment and Method: Stylet and Oral airway Placement Confirmation: ETT inserted through vocal cords under direct vision,  positive ETCO2 and breath sounds checked- equal and bilateral Secured at: 23 cm Tube secured with: Tape Dental Injury: Teeth and Oropharynx as per pre-operative assessment  Difficulty Due To: Difficulty was anticipated, Difficult Airway- due to limited oral opening and Difficult Airway-  due to neck instability Comments: Stretcher induction, head/neck remain in midline and neutral position of comfort per pt. Smooth IV induction, atraumatic and successful intubation.

## 2020-04-08 NOTE — H&P (Signed)
Subjective:   Patient is a 64 y.o. male admitted for cervical spondylosis. The patient first presented to me with complaints of neck pain. Onset of symptoms was several months ago. The pain is described as aching and stabbing and occurs all day. The pain is rated severe, and is located in the neck and radiates to the shoulders. The symptoms have been progressive. Symptoms are exacerbated by extending head backwards, and are relieved by none.  Previous work up includes MRI of cervical spine, results: spinal stenosis.  Past Medical History:  Diagnosis Date  . ADHD (attention deficit hyperactivity disorder)   . Allergy    RHINITIS-seasonal  . Anal fissure   . Arthritis    back,neck  . Dyslexia   . ED (erectile dysfunction)   . Hemorrhoids   . Hemorrhoids, internal, with bleeding 02/13/2015   Grade 2 hemorrhoids 02/13/15 band ligation right posterior hemorrhoidal bundle 03/04/15 band ligation right anterior hemorrhoidal bundle 04/15/15 band ligation left lateral hemorrhoidal bundle   . History of migraine headaches   . Sleep disturbance   . Tension headache     Past Surgical History:  Procedure Laterality Date  . APPENDECTOMY    . COLONOSCOPY  2006  . HEMORRHOID BANDING    . TONSILLECTOMY      No Known Allergies  Social History   Tobacco Use  . Smoking status: Never Smoker  . Smokeless tobacco: Never Used  Substance Use Topics  . Alcohol use: Yes    Comment: occ    Family History  Problem Relation Age of Onset  . Breast cancer Mother   . Diabetes type II Father   . Heart failure Father   . COPD Father   . Colon cancer Neg Hx   . Rectal cancer Neg Hx   . Stomach cancer Neg Hx    Prior to Admission medications   Medication Sig Start Date End Date Taking? Authorizing Provider  Biotin w/ Vitamins C & E (HAIR/SKIN/NAILS PO) Take 1 tablet by mouth daily.   Yes [provider]  buPROPion (WELLBUTRIN SR) 150 MG 12 hr tablet Take 1 tablet (150 mg total) by mouth daily.  06/13/19  Yes Ronnald Nian, MD  cyclobenzaprine (FLEXERIL) 10 MG tablet Take 1 tablet (10 mg total) by mouth at bedtime. 11/16/19  Yes Butch Penny, NP  doxycycline (VIBRAMYCIN) 100 MG capsule Take 1 capsule (100 mg total) by mouth 2 (two) times daily. 03/22/20  Yes Wallis Bamberg, PA-C  ferrous sulfate 325 (65 FE) MG tablet Take 325 mg by mouth daily with breakfast.   Yes [provider]  gabapentin (NEURONTIN) 100 MG capsule Take 1 capsule (100 mg total) by mouth at bedtime. 11/16/19  Yes Butch Penny, NP  HYDROcodone-acetaminophen (NORCO/VICODIN) 5-325 MG tablet Take 1 tablet by mouth every 6 (six) hours as needed for moderate pain.  03/21/20  Yes [provider]  Magnesium 500 MG TABS Take 500 mg by mouth daily.    Yes [provider]  METHYLPHENIDATE 36 MG PO CR tablet TAKE 1 TABLET EACH DAY. Patient taking differently: Take 36 mg by mouth daily.  01/19/20  Yes Ronnald Nian, MD  naproxen (NAPROSYN) 500 MG tablet Take 1 tablet (500 mg total) by mouth 2 (two) times daily with a meal. Patient taking differently: Take 500 mg by mouth daily.  03/22/20  Yes Wallis Bamberg, PA-C  Probiotic Product (PROBIOTIC DAILY PO) Take 1 tablet by mouth daily. GX   Yes [provider]  rOPINIRole (REQUIP)  0.5 MG tablet Take 1 tablet (0.5 mg total) by mouth at bedtime. 11/16/19  Yes Butch Penny, NP  traMADol (ULTRAM) 50 MG tablet Take 50 mg by mouth every 6 (six) hours as needed for moderate pain.  02/07/20  Yes [provider]  traZODone (DESYREL) 150 MG tablet TAKE 1 & 1/2 TABLETS AT BEDTIME AS NEEDED FOR SLEEP. Patient taking differently: Take 150 mg by mouth at bedtime.  11/16/19  Yes Butch Penny, NP  flurbiprofen (ANSAID) 100 MG tablet TAKE 1 TABLET TWICE DAILY AS NEEDED FOR HEADACHE. NO MORE THAN 4 DAYS/WEEK. Patient taking differently: Take 100 mg by mouth daily as needed (Headache).  03/19/20   Ronnald Nian, MD  rizatriptan (MAXALT) 10 MG tablet Take 1  tablet (10 mg total) by mouth as needed (not had in 1 year as of 01-23-2015). May repeat in 2 hours if needed Patient taking differently: Take 10 mg by mouth as needed for migraine (not had in 1 year as of 01-23-2015). May repeat in 2 hours if needed  09/20/18   Ronnald Nian, MD  sildenafil (REVATIO) 20 MG tablet TAKE UP TO 5 TABLETS DAILY AS NEEDED. Patient taking differently: Take 20 mg by mouth daily as needed (ED).  06/13/19   Ronnald Nian, MD     Review of Systems  Positive ROS: neg  All other systems have been reviewed and were otherwise negative with the exception of those mentioned in the HPI and as above.  Objective: Vital signs in last 24 hours: Temp:  [98 F (36.7 C)] 98 F (36.7 C) (08/16 0645) Pulse Rate:  [81] 81 (08/16 0645) Resp:  [18] 18 (08/16 0645) BP: (133)/(86) 133/86 (08/16 0645) SpO2:  [98 %] 98 % (08/16 0645) Weight:  [86.9 kg] 86.9 kg (08/16 0645)  General Appearance: Alert, cooperative, no distress, appears stated age Head: Normocephalic, without obvious abnormality, atraumatic Eyes: PERRL, conjunctiva/corneas clear, EOM's intact      Neck: Supple, symmetrical, trachea midline, Back: Symmetric, no curvature, ROM normal, no CVA tenderness Lungs:  respirations unlabored Heart: Regular rate and rhythm Abdomen: Soft, non-tender Extremities: Extremities normal, atraumatic, no cyanosis or edema Pulses: 2+ and symmetric all extremities Skin: Skin color, texture, turgor normal, no rashes or lesions  NEUROLOGIC:  Mental status: Alert and oriented x4, no aphasia, good attention span, fund of knowledge and memory  Motor Exam - grossly normal Sensory Exam - grossly normal Reflexes: 1+ Coordination - grossly normal Gait - grossly normal Balance - grossly normal Cranial Nerves: I: smell Not tested  II: visual acuity  OS: nl    OD: nl  II: visual fields Full to confrontation  II: pupils Equal, round, reactive to light  III,VII: ptosis None  III,IV,VI:  extraocular muscles  Full ROM  V: mastication Normal  V: facial light touch sensation  Normal  V,VII: corneal reflex  Present  VII: facial muscle function - upper  Normal  VII: facial muscle function - lower Normal  VIII: hearing Not tested  IX: soft palate elevation  Normal  IX,X: gag reflex Present  XI: trapezius strength  5/5  XI: sternocleidomastoid strength 5/5  XI: neck flexion strength  5/5  XII: tongue strength  Normal    Data Review Lab Results  Component Value Date   WBC 5.7 04/02/2020   HGB 15.9 04/02/2020   HCT 47.8 04/02/2020   MCV 86.6 04/02/2020   PLT 255 04/02/2020   Lab Results  Component Value Date   NA 141 06/13/2019  K 4.8 06/13/2019   CL 103 06/13/2019   CO2 26 06/13/2019   BUN 19 06/13/2019   CREATININE 1.13 06/13/2019   GLUCOSE 82 06/13/2019   No results found for: INR, PROTIME  Assessment:   Cervical neck pain with herniated nucleus pulposus/ spondylosis/ stenosis at C2-7. Estimated body mass index is 25.98 kg/m as calculated from the following:   Height as of this encounter: 6' (1.829 m).   Weight as of this encounter: 86.9 kg.  Patient has failed conservative therapy. Planned surgery : CL, PCF C2-7  Plan:   I explained the condition and procedure to the patient and answered any questions.  Patient wishes to proceed with procedure as planned. Understands risks/ benefits/ and expected or typical outcomes.  Tia Alert 04/08/2020 7:29 AM

## 2020-04-08 NOTE — Op Note (Signed)
04/08/2020  10:37 AM  PATIENT:  Parker Silva  64 y.o. male  PRE-OPERATIVE DIAGNOSIS: Cervical spondylosis, cervical degenerative disc disease, luxation C2-3 C3-4, facet arthropathy C2-3 on the right, spinal stenosis C3-4 C4-5, neck and shoulder pain  POST-OPERATIVE DIAGNOSIS:  same  PROCEDURE:   1.  Decompressive cervical laminectomy and medial facetectomy foraminotomies C3-4 and C4-5 bilaterally,  2.  Posterior cervical arthrodesis C2-C7 inclusive utilizing locally harvested morselized autologous bone graft and morselized allograft,  3.  Posterior cervical lateral mass fixation C2-C7 inclusive utilizing Alphatec lateral mass screws  SURGEON:  Marikay Alar, MD  ASSISTANTS: Verlin Dike, FNP  ANESTHESIA:   General  EBL: 250 ml  Total I/O In: 1100 [I.V.:1000; IV Piggyback:100] Out: 250 [Blood:250]  BLOOD ADMINISTERED: none  DRAINS: Medium Hemovac  SPECIMEN:  none  INDICATION FOR PROCEDURE: This patient presented with neck and shoulder pain for quite a long time.  He tried injections and medical therapy without relief.. Imaging showed significant spondylosis C2-C7. The patient tried conservative measures without relief. Pain was debilitating. Recommended posterior cervical decompression and instrumented fusion. Patient understood the risks, benefits, and alternatives and potential outcomes and wished to proceed.  PROCEDURE DETAILS: The patient was brought to the operating room. Generalized endotracheal anesthesia was induced. The patient was affixed a 3 point [place] headrest and rolled into the prone position on chest rolls. All pressure points were padded. The posterior cervical region was cleaned and prepped with DuraPrep and then draped in the usual sterile fashion. 7 cc of local anesthesia was injected and a dorsal midline incision made in the posterior cervical region and carried down to the cervical fascia. The fascia was opened and the paraspinous musculature was taken  down to expose C2-C7. Intraoperative fluoroscopy confirmed my level and then the dissection was carried out over the lateral facets. I localized the midpoint of each lateral mass and marked a region 1 mm medial to the midpoint of the lateral mass, and then drilled in an upward and outward direction into the safe zone of each lateral mass. I drilled to a depth of 14 mm and then checked my drill hole with a ball probe. I then placed a 14 mm lateral mass screws into the safe zone of each lateral mass of C3-C7 inclusive until they were 2 fingers tight.  At C2, we localized the entrance to the pars and utilizing the drill drilled in an upward and very slightly lateral direction into the pars of C2 to a depth of about 16 mm.  We palpated with a ball probe and then placed 52mm left pars screws into C2.  I then remove the spinous process of C4.  I then gently decompressed the central canal with the 1 and 2 mm Kerrison punch from C3-4 to C4-5. Medial facetectomies were performed, and foraminotomies were performed at C4-5 and somewhat at C3-4 also. Once the decompression was complete the dura was full and capacious and I could see the spinal cord pulsatile through the dura. I then decorticated the lateral masses and the facet joints and packed them with local autograft saved during the decompression and morcellized allograft to perform arthrodesis from C2-C7. I then placed rods into the multiaxial screw heads of the screws and locked these into position with the locking caps and anti-torque device. I then checked the final construct with AP/Lat fluoroscopy. I irrigated with saline solution containing bacitracin. I placed a medium Hemovac drain through separate stab incision, and lined the dura with Gelfoam. After hemostasis was  achieved I closed the muscle and the fascia with 0 Vicryl, subcutaneous tissue with 2-0 Vicryl, and the subcuticular tissue with 3-0 Vicryl. The skin was closed with benzoin and Steri-Strips.  My nurse  practitioner was personally involved in the exposure, in placement of the lateral mass screws and the closure. A sterile dressing was applied, the patient was turned to the supine position and taken out of the headrest, awakened from general anesthesia and transferred to the recovery room in stable condition. At the end of the procedure all sponge, needle and instrument counts were correct.   PLAN OF CARE: Admit to inpatient   PATIENT DISPOSITION:  PACU - hemodynamically stable.   Delay start of Pharmacological VTE agent (>24hrs) due to surgical blood loss or risk of bleeding:  yes

## 2020-04-08 NOTE — Progress Notes (Signed)
Orthopedic Tech Progress Note Patient Details:  Parker Silva June 17, 1956 [id number]  Ortho Devices Type of Ortho Device: Soft collar Ortho Device/Splint Location: neck Ortho Device/Splint Interventions: Ordered, Application   Post Interventions Patient Tolerated: Well Instructions Provided: Care of device   Donald Pore 04/08/2020, 5:51 PM

## 2020-04-08 NOTE — Progress Notes (Signed)
Patient ID: Parker Silva, male   DOB: 01/06/1956, 64 y.o.   MRN: [id number] Looks ok, neck very sore, no arm pain or NTW, neuro intact

## 2020-04-09 ENCOUNTER — Encounter (HOSPITAL_COMMUNITY): Payer: Self-pay | Admitting: Neurological Surgery

## 2020-04-09 MED ORDER — HYDROCODONE-ACETAMINOPHEN 10-325 MG PO TABS: 2.0000 | ORAL_TABLET | ORAL | 0 refills | Status: DC | PRN

## 2020-04-09 MED ORDER — METHOCARBAMOL 500 MG PO TABS: 500.0000 mg | ORAL_TABLET | Freq: Four times a day (QID) | ORAL | 0 refills | Status: DC | PRN

## 2020-04-09 NOTE — Discharge Instructions (Signed)

## 2020-04-09 NOTE — Progress Notes (Signed)
Patient is discharged from room 3C08 at this time. Alert and in stable condition. IV site d/c'd and instructions read to patient and partner with understanding verbalized. Left unit via wheelchair with all belongings at side.

## 2020-04-09 NOTE — Discharge Summary (Signed)
Physician Discharge Summary  Patient ID: GIAVANNI ODONOVAN MRN: [id number] DOB/AGE: August 27, 1955 64 y.o.  Admit date: 04/08/2020 Discharge date: 04/09/2020  Admission Diagnoses: Cervical spondylosis, cervical degenerative disc disease, luxation C2-3 C3-4, facet arthropathy C2-3 on the right, spinal stenosis C3-4 C4-5, neck and shoulder pain   Discharge Diagnoses: same   Discharged Condition: good  Hospital Course: The patient was admitted on 04/08/2020 and taken to the operating room where the patient underwent posterior cervical decompression and fusion C2-C7. The patient tolerated the procedure well and was taken to the recovery room and then to the floor in stable condition. The hospital course was routine. There were no complications. The wound remained clean dry and intact. Pt had appropriate neck soreness. No complaints of arm pain or new N/T/W. The patient remained afebrile with stable vital signs, and tolerated a regular diet. The patient continued to increase activities, and pain was well controlled with oral pain medications.   Consults: None  Significant Diagnostic Studies:  Results for orders placed or performed during the hospital encounter of 04/08/20  ABO/Rh  Result Value Ref Range   ABO/RH(D)      O POS Performed at [facility], [address], [place], [place] [address]     DG Cervical Spine 1 View  Result Date: 04/08/2020 CLINICAL DATA:  Intraoperative imaging posterior cervical fusion. EXAM: DG C-ARM 1-60 MIN; DG CERVICAL SPINE - 1 VIEW COMPARISON:  Plain film cervical spine 03/21/2020. FINDINGS: Three fluoroscopic intraoperative spot views of the cervical spine in lateral projection are provided. Posterior spinal fusion hardware is in place from C2-C7. No acute abnormality is identified. IMPRESSION: Intraoperative imaging for C2-C7 posterior fusion. Electronically Signed   By: Drusilla Kanner M.D.   On: 04/08/2020 10:55   MR CERVICAL SPINE WO CONTRAST  Result  Date: 03/19/2020 CLINICAL DATA:  Jabier Mutton initial evaluation for chronic neck pain. EXAM: MRI CERVICAL SPINE WITHOUT CONTRAST TECHNIQUE: Multiplanar, multisequence MR imaging of the cervical spine was performed. No intravenous contrast was administered. COMPARISON:  Prior MRI from 04/28/2014. FINDINGS: Alignment: Straightening of the normal cervical lordosis with underlying mild levoscoliosis. Trace anterolisthesis of C2 on C3, with trace retrolisthesis of C3 on C4. Findings chronic and facet mediated. Vertebrae: Vertebral body height maintained without evidence for acute or chronic fracture. Bone marrow signal intensity diffusely heterogeneous but within normal limits. No discrete or worrisome osseous lesions. Reactive marrow edema seen about the right C2-3 facet due to facet arthritis (series 7, image 3). No other abnormal marrow edema. Cord: Signal intensity within the cervical spinal cord is normal. Posterior Fossa, vertebral arteries, paraspinal tissues: Visualized brain and posterior fossa within normal limits. Craniocervical junction normal. Paraspinous and prevertebral soft tissues within normal limits. Normal intravascular flow voids seen within the vertebral arteries bilaterally. Disc levels: C2-C3: Central disc protrusion indents the ventral thecal sac, contacting the ventral spinal cord. Mild spinal stenosis without significant cord deformity. Severe right with mild left facet degeneration. Resultant mild right C3 foraminal stenosis. No significant left foraminal encroachment. C3-C4: Chronic intervertebral disc space narrowing with diffuse degenerative disc osteophyte. Broad posterior component flattens and effaces the ventral thecal sac, asymmetric to the right. Superimposed facet and ligament flavum hypertrophy. Resultant mild-to-moderate spinal stenosis. Severe right with moderate left C4 foraminal narrowing. C4-C5: Chronic intervertebral disc space narrowing with diffuse degenerative disc osteophyte,  asymmetric to the left. Broad posterior component flattens and partially faces the ventral thecal sac, greater on the left. Secondary mild to moderate spinal stenosis with mild flattening  of the ventral spinal cord. Severe left with moderate right C5 foraminal narrowing. C5-C6: Chronic intervertebral disc space narrowing with diffuse degenerative disc osteophyte. Broad posterior component effaces the ventral thecal sac. Superimposed ligament flavum hypertrophy. Resultant mild to moderate spinal stenosis. Severe bilateral C6 foraminal stenosis, left worse than right. C6-C7: Chronic intervertebral disc space narrowing with diffuse degenerative disc osteophyte. Flattening of the ventral thecal sac. Superimposed ligament flavum hypertrophy. Mild spinal stenosis. Severe left with moderate right C7 foraminal narrowing. C7-T1: Small central disc protrusion mildly indents the ventral thecal sac. Superimposed mild facet hypertrophy. No significant spinal stenosis. Mild bilateral C8 foraminal narrowing. Visualized upper thoracic spine demonstrates no significant finding. IMPRESSION: 1. Moderate multilevel cervical spondylosis with resultant mild to moderate diffuse spinal stenosis at C2-3 through C6-7. 2. Multifactorial degenerative changes with resultant moderate to severe bilateral C4 through C7 foraminal stenosis as above. 3. Reactive marrow edema about the right C2-3 facet due to facet arthritis. Finding could serve as a source for neck pain. Electronically Signed   By: Rise MuBenjamin  McClintock M.D.   On: 03/19/2020 00:58   MR LUMBAR SPINE WO CONTRAST  Result Date: 03/19/2020 CLINICAL DATA:  Initial evaluation for chronic lower back pain with bilateral leg and buttock pain. EXAM: MRI LUMBAR SPINE WITHOUT CONTRAST TECHNIQUE: Multiplanar, multisequence MR imaging of the lumbar spine was performed. No intravenous contrast was administered. COMPARISON:  Prior MRI from 06/03/2018. FINDINGS: Segmentation: Standard. Lowest  well-formed disc space labeled the L5-S1 level. Alignment: Dextroscoliosis with apex at L2-3. 4 mm retrolisthesis of L1 on L2 and L2 on L3, chronic and facet mediated. Trace retrolisthesis of L3 on L4 noted as well. Vertebrae: Vertebral body height maintained without evidence for acute or chronic fracture. Bone marrow signal intensity within normal limits. 17 mm benign hemangioma noted within the L2 vertebral body. No other discrete or worrisome osseous lesions. Prominent discogenic reactive endplate changes present about the left aspect of the L2-3 interspace due to scoliotic curvature. More mild degenerative changes noted at the L5-S1 level on the right. No other abnormal marrow edema. Conus medullaris and cauda equina: Conus extends to the T12-L1 level. Conus and cauda equina appear normal. Paraspinal and other soft tissues: Paraspinous soft tissues within normal limits. Multiple scattered benign appearing T2 hyperintense cyst noted within the kidneys bilaterally. Visualized visceral structures otherwise unremarkable. Disc levels: L1-2: Retrolisthesis. Chronic intervertebral disc space narrowing with diffuse disc bulge and disc desiccation. Superimposed reactive endplate spurring. Superimposed mild bilateral facet hypertrophy. No significant spinal stenosis. Foramina remain patent. L2-3: Retrolisthesis. Advanced degenerative intervertebral disc space narrowing with disc desiccation and diffuse disc bulge. Disc bulging slightly asymmetric to the left. Associated reactive endplate changes with marginal endplate spurring and marrow edema, most pronounced on the left. Moderate bilateral facet hypertrophy with associated small joint effusions. Resultant mild to moderate bilateral lateral recess stenosis, left greater than right, with mild narrowing of the central canal. Foramina remain patent. L3-4: Disc desiccation with mild disc bulge. Superimposed shallow left foraminal to extraforaminal disc protrusion with  associated annular fissure (series 13, image 24). This closely approximates the exiting left L3 nerve root without frank neural impingement. Moderate bilateral facet hypertrophy. Resultant mild narrowing of the lateral recesses bilaterally. Mild bilateral L3 foraminal stenosis. L4-5: Negative interspace. Moderate bilateral facet hypertrophy. Resultant mild narrowing of the left lateral recess. Central canal remains patent. No significant foraminal encroachment. L5-S1: Chronic intervertebral disc space narrowing with diffuse disc bulge and disc desiccation. Associated reactive endplate changes with marginal endplate osteophytic spurring. Mild  to moderate right worse than left facet hypertrophy. Associated small joint effusion on the left. No significant spinal stenosis. Mild to moderate bilateral L5 foraminal narrowing, slightly worse on the right. IMPRESSION: 1. Advanced degenerative disc space narrowing with reactive endplate changes about the L2-3 interspace, which could contribute to underlying back pain. 2. Multifactorial degenerative changes throughout the lumbar spine as above. Resultant mild to moderate bilateral lateral recess stenosis at L2-3 and L3-4 as above. 3. Mild-to-moderate bilateral L5 foraminal stenosis related to disc bulge, reactive endplate changes, and facet degeneration, slightly worse on the right. Electronically Signed   By: Rise Mu M.D.   On: 03/19/2020 01:12   DG C-Arm 1-60 Min  Result Date: 04/08/2020 CLINICAL DATA:  Intraoperative imaging posterior cervical fusion. EXAM: DG C-ARM 1-60 MIN; DG CERVICAL SPINE - 1 VIEW COMPARISON:  Plain film cervical spine 03/21/2020. FINDINGS: Three fluoroscopic intraoperative spot views of the cervical spine in lateral projection are provided. Posterior spinal fusion hardware is in place from C2-C7. No acute abnormality is identified. IMPRESSION: Intraoperative imaging for C2-C7 posterior fusion. Electronically Signed   By: Drusilla Kanner M.D.   On: 04/08/2020 10:55    Antibiotics:  Anti-infectives (From admission, onward)   Start     Dose/Rate Route Frequency Ordered Stop   04/08/20 1600  ceFAZolin (ANCEF) IVPB 2g/100 mL premix        2 g 200 mL/hr over 30 Minutes Intravenous Every 8 hours 04/08/20 1220 04/09/20 0738   04/08/20 0844  bacitracin 50,000 Units in sodium chloride 0.9 % 500 mL irrigation  Status:  Discontinued          As needed 04/08/20 0844 04/08/20 1036   04/08/20 0645  ceFAZolin (ANCEF) IVPB 2g/100 mL premix        2 g 200 mL/hr over 30 Minutes Intravenous On call to O.R. 04/08/20 [id number] 04/08/20 0825      Discharge Exam: Blood pressure 116/74, pulse (!) 105, temperature (!) 97.5 F (36.4 C), temperature source Oral, resp. rate 16, height 6' (1.829 m), weight 86.9 kg, SpO2 97 %. Neurologic: Grossly normal Ambulating and voiding well, incision cdi  Discharge Medications:   Allergies as of 04/09/2020   No Known Allergies     Medication List    TAKE these medications   buPROPion 150 MG 12 hr tablet Commonly known as: WELLBUTRIN SR Take 1 tablet (150 mg total) by mouth daily.   cyclobenzaprine 10 MG tablet Commonly known as: FLEXERIL Take 1 tablet (10 mg total) by mouth at bedtime.   doxycycline 100 MG capsule Commonly known as: VIBRAMYCIN Take 1 capsule (100 mg total) by mouth 2 (two) times daily.   ferrous sulfate 325 (65 FE) MG tablet Take 325 mg by mouth daily with breakfast.   flurbiprofen 100 MG tablet Commonly known as: ANSAID TAKE 1 TABLET TWICE DAILY AS NEEDED FOR HEADACHE. NO MORE THAN 4 DAYS/WEEK. What changed: See the new instructions.   gabapentin 100 MG capsule Commonly known as: NEURONTIN Take 1 capsule (100 mg total) by mouth at bedtime.   HAIR/SKIN/NAILS PO Take 1 tablet by mouth daily.   HYDROcodone-acetaminophen 5-325 MG tablet Commonly known as: NORCO/VICODIN Take 1 tablet by mouth every 6 (six) hours as needed for moderate pain. What changed: Another  medication with the same name was added. Make sure you understand how and when to take each.   HYDROcodone-acetaminophen 10-325 MG tablet Commonly known as: NORCO Take 2 tablets by mouth every 4 (four) hours as needed for  severe pain ((score 7 to 10)). What changed: You were already taking a medication with the same name, and this prescription was added. Make sure you understand how and when to take each.   Magnesium 500 MG Tabs Take 500 mg by mouth daily.   methocarbamol 500 MG tablet Commonly known as: ROBAXIN Take 1 tablet (500 mg total) by mouth every 6 (six) hours as needed for muscle spasms.   methylphenidate 36 MG CR tablet Commonly known as: CONCERTA TAKE 1 TABLET EACH DAY. What changed: See the new instructions.   naproxen 500 MG tablet Commonly known as: NAPROSYN Take 1 tablet (500 mg total) by mouth 2 (two) times daily with a meal. What changed: when to take this   PROBIOTIC DAILY PO Take 1 tablet by mouth daily. GX   rizatriptan 10 MG tablet Commonly known as: MAXALT Take 1 tablet (10 mg total) by mouth as needed (not had in 1 year as of 01-23-2015). May repeat in 2 hours if needed What changed: reasons to take this   rOPINIRole 0.5 MG tablet Commonly known as: REQUIP Take 1 tablet (0.5 mg total) by mouth at bedtime.   sildenafil 20 MG tablet Commonly known as: REVATIO TAKE UP TO 5 TABLETS DAILY AS NEEDED. What changed:   how much to take  how to take this  when to take this  reasons to take this  additional instructions   traMADol 50 MG tablet Commonly known as: ULTRAM Take 50 mg by mouth every 6 (six) hours as needed for moderate pain.   traZODone 150 MG tablet Commonly known as: DESYREL TAKE 1 & 1/2 TABLETS AT BEDTIME AS NEEDED FOR SLEEP. What changed:   how much to take  how to take this  when to take this  additional instructions       Disposition: home   Final Dx: posterior cervical C2-7  Discharge Instructions     Remove  dressing in 72 hours   Complete by: As directed    Call MD for:  difficulty breathing, headache or visual disturbances   Complete by: As directed    Call MD for:  persistant nausea and vomiting   Complete by: As directed    Call MD for:  redness, tenderness, or signs of infection (pain, swelling, redness, odor or green/yellow discharge around incision site)   Complete by: As directed    Call MD for:  severe uncontrolled pain   Complete by: As directed    Call MD for:  temperature >100.4   Complete by: As directed    Diet - low sodium heart healthy   Complete by: As directed    Driving Restrictions   Complete by: As directed    No driving for 2 weeks, no riding in the car for 1 week   Increase activity slowly   Complete by: As directed    Lifting restrictions   Complete by: As directed    No lifting more than 8 lbs         Signed: Tiana Loft Shaunte Tuft 04/09/2020, 8:01 AM

## 2020-04-09 NOTE — Evaluation (Signed)
Occupational Therapy Evaluation Patient Details Name: Parker Silva MRN: [id number] DOB: 1956/04/09 Today's Date: 04/09/2020    History of Present Illness 63 yo male s/p ACDF C2-7 PMH ADHD arthritis dyslexia ED    Clinical Impression   Patient evaluated by Occupational Therapy with no further acute OT needs identified. All education has been completed and the patient has no further questions. See below for any follow-up Occupational Therapy or equipment needs. OT to sign off. Thank you for referral.      Follow Up Recommendations  No OT follow up    Equipment Recommendations  None recommended by OT    Recommendations for Other Services       Precautions / Restrictions Precautions Precautions: Cervical Precaution Comments: provided hand out and return demo of all precautions Required Braces or Orthoses: Cervical Brace Cervical Brace: Soft collar;For comfort      Mobility Bed Mobility Overal bed mobility: Modified Independent             General bed mobility comments: min cues for correct sequence  Transfers Overall transfer level: Modified independent                    Balance Overall balance assessment: Mild deficits observed, not formally tested                                         ADL either performed or assessed with clinical judgement   ADL Overall ADL's : Modified independent                                       General ADL Comments: pt able to dress LB during session, groom hands at sink, complete toilet transfer from sitting to standing and open all items on lunch tray including mayo packet. pt picking up chip bag with pincher grasp of L UE  Cervical precautions ( handout provided): Educated patient on don doff brace with return demonstration, educated on oral care using cups, washing face with cloth, never to wash directly on incision site, avoid neck rotation flexion and extension, positioning with  pillows in chair for bil UE, sleeping positioning, avoiding pushing / pulling with bil UE, Pt educated on need to notify doctor / RN of swallowing changes or choking. Pt able to open small packets during session and reports normal sensation so no fine motor exercises provided.     Vision Baseline Vision/History: Wears glasses Wears Glasses: At all times       Perception     Praxis      Pertinent Vitals/Pain Pain Assessment: Faces Faces Pain Scale: Hurts a little bit Pain Location: drain pulling Pain Descriptors / Indicators: Operative site guarding Pain Intervention(s): Monitored during session     Hand Dominance Right   Extremity/Trunk Assessment Upper Extremity Assessment Upper Extremity Assessment: Overall WFL for tasks assessed   Lower Extremity Assessment Lower Extremity Assessment: Overall WFL for tasks assessed   Cervical / Trunk Assessment Cervical / Trunk Assessment: Other exceptions (s/p surg)   Communication Communication Communication: No difficulties   Cognition Arousal/Alertness: Awake/alert Behavior During Therapy: WFL for tasks assessed/performed Overall Cognitive Status: Within Functional Limits for tasks assessed  General Comments: likes to joke during session.    General Comments  dressing dry and intact    Exercises     Shoulder Instructions      Home Living Family/patient expects to be discharged to:: Private residence Living Arrangements: Spouse/significant other (partner has hired Dispensing optician to be with patient) Available Help at Discharge: Family;Available 24 hours/day Type of Home: House Home Access: Stairs to enter [employer] of Steps: 1   Home Layout: One level     Bathroom Shower/Tub: Producer, television/film/video: Handicapped height     Home Equipment: None;Hand held shower head   Additional Comments: 2 dogs in the home.       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        OT Problem List: Pain      OT Treatment/Interventions:      OT Goals(Current goals can be found in the care plan section) Acute Rehab OT Goals Potential to Achieve Goals: Good  OT Frequency:     Barriers to D/C:            Co-evaluation              AM-PAC OT "6 Clicks" Daily Activity     Outcome Measure Help from another person eating meals?: None Help from another person taking care of personal grooming?: None Help from another person toileting, which includes using toliet, bedpan, or urinal?: None Help from another person bathing (including washing, rinsing, drying)?: None Help from another person to put on and taking off regular upper body clothing?: None Help from another person to put on and taking off regular lower body clothing?: None 6 Click Score: 24   End of Session Equipment Utilized During Treatment: Cervical collar Nurse Communication: Mobility status;Precautions  Activity Tolerance: Patient tolerated treatment well Patient left: in chair;with call bell/phone within reach  OT Visit Diagnosis: Unsteadiness on feet (R26.81)                Time: 6237-6283 OT Time Calculation (min): 28 min Charges:  OT General Charges $OT Visit: 1 Visit OT Evaluation $OT Eval Moderate Complexity: 1 Mod OT Treatments $Self Care/Home Management : 8-22 mins   Brynn, OTR/L  Acute Rehabilitation Services Pager: [phone] Office: [phone] .   Mateo Flow 04/09/2020, 12:32 PM

## 2020-04-22 ENCOUNTER — Other Ambulatory Visit: Payer: Self-pay | Admitting: Family Medicine

## 2020-04-22 NOTE — Telephone Encounter (Signed)
Is this okay to refill? 

## 2020-05-14 DIAGNOSIS — M5030 Other cervical disc degeneration, unspecified cervical region: Secondary | ICD-10-CM | POA: Diagnosis not present

## 2020-05-16 DIAGNOSIS — M5030 Other cervical disc degeneration, unspecified cervical region: Secondary | ICD-10-CM | POA: Diagnosis not present

## 2020-05-21 DIAGNOSIS — M542 Cervicalgia: Secondary | ICD-10-CM | POA: Diagnosis not present

## 2020-05-21 DIAGNOSIS — M5030 Other cervical disc degeneration, unspecified cervical region: Secondary | ICD-10-CM | POA: Diagnosis not present

## 2020-05-24 ENCOUNTER — Other Ambulatory Visit: Payer: Self-pay | Admitting: Family Medicine

## 2020-05-24 NOTE — Telephone Encounter (Signed)
[facility] requesting to fill pt methylphenidate. Please advise [facility] 

## 2020-07-29 ENCOUNTER — Other Ambulatory Visit: Payer: Self-pay | Admitting: Family Medicine

## 2020-07-29 DIAGNOSIS — F341 Dysthymic disorder: Secondary | ICD-10-CM

## 2020-07-29 NOTE — Telephone Encounter (Signed)
[facility] is requesting to fill pt bupropion. Please advise [facility]

## 2020-07-29 NOTE — Telephone Encounter (Signed)
He needs a CPE/medication management visit.

## 2020-08-01 ENCOUNTER — Other Ambulatory Visit: Payer: Self-pay | Admitting: Family Medicine

## 2020-08-01 DIAGNOSIS — M542 Cervicalgia: Secondary | ICD-10-CM | POA: Diagnosis not present

## 2020-08-01 MED ORDER — METHYLPHENIDATE HCL ER (OSM) 36 MG PO TBCR: EXTENDED_RELEASE_TABLET | ORAL | 0 refills | Status: DC

## 2020-08-01 NOTE — Progress Notes (Signed)
He is apparently 3 weeks short on his Concerta and not really sure how this happened.  I will call in a 21-day supply which should cover him until he will need a refill in early January.

## 2020-08-19 ENCOUNTER — Other Ambulatory Visit: Payer: Self-pay | Admitting: Family Medicine

## 2020-08-19 ENCOUNTER — Other Ambulatory Visit: Payer: Self-pay | Admitting: Adult Health

## 2020-08-19 DIAGNOSIS — F341 Dysthymic disorder: Secondary | ICD-10-CM

## 2020-08-19 NOTE — Telephone Encounter (Signed)
Are these okay to refill? Please see note on rx from pharmacy.

## 2020-08-22 ENCOUNTER — Other Ambulatory Visit: Payer: Self-pay

## 2020-08-22 ENCOUNTER — Encounter: Payer: Self-pay | Admitting: Family Medicine

## 2020-08-22 ENCOUNTER — Ambulatory Visit (INDEPENDENT_AMBULATORY_CARE_PROVIDER_SITE_OTHER): Payer: BC Managed Care – PPO | Admitting: Family Medicine

## 2020-08-22 VITALS — BP 120/82 | HR 86 | Temp 98.0°F | Wt 190.0 lb

## 2020-08-22 DIAGNOSIS — Z23 Encounter for immunization: Secondary | ICD-10-CM

## 2020-08-22 DIAGNOSIS — R5383 Other fatigue: Secondary | ICD-10-CM

## 2020-08-22 DIAGNOSIS — G4750 Parasomnia, unspecified: Secondary | ICD-10-CM

## 2020-08-22 DIAGNOSIS — G43009 Migraine without aura, not intractable, without status migrainosus: Secondary | ICD-10-CM

## 2020-08-22 DIAGNOSIS — Z981 Arthrodesis status: Secondary | ICD-10-CM

## 2020-08-22 DIAGNOSIS — G2581 Restless legs syndrome: Secondary | ICD-10-CM

## 2020-08-22 DIAGNOSIS — F909 Attention-deficit hyperactivity disorder, unspecified type: Secondary | ICD-10-CM | POA: Diagnosis not present

## 2020-08-22 DIAGNOSIS — Z13220 Encounter for screening for lipoid disorders: Secondary | ICD-10-CM

## 2020-08-22 DIAGNOSIS — J301 Allergic rhinitis due to pollen: Secondary | ICD-10-CM

## 2020-08-22 DIAGNOSIS — G479 Sleep disorder, unspecified: Secondary | ICD-10-CM

## 2020-08-22 NOTE — Progress Notes (Signed)
   Subjective:    Patient ID: Parker Silva, male    DOB: May 30, 1956, 64 y.o.   MRN: [id number]  HPI He is here for an interval evaluation.  He has had recent neck surgery and seems to be recovering nicely from that.  He has also have a sleep disturbance and is seeing Dr. Kandyce Rud for that.  He is presently taking trazodone, gabapentin and Requip for that.  He does have a history of RLS his neurosurgeon also has him taking a muscle relaxer at night to help with sleep as well as muscle spasm.  He uses tramadol on an as-needed basis.  He states that for the last several weeks he has had difficulty with fatigue, ear congestion, PND, sore throat, nasal congestion and headache but seems to be recovering from that.  He has underlying allergies and does use Allegra.  He also recently had some issues with his right foot dorsal service but this seems to be recovering nicely.  He does have underlying ADHD and presently is on Concerta as well as bupropion.  The Concerta a little less roughly 8 hours and he is very happy with this.  He does have history of migraine headaches but this seems to be relatively quiet at the present time.   Review of Systems     Objective:   Physical Exam Alert and in no distress. Tympanic membranes and canals are normal. Pharyngeal area is normal. Neck is supple without adenopathy or thyromegaly. Cardiac exam shows a regular sinus rhythm without murmurs or gallops. Lungs are clear to auscultation.        Assessment & Plan:  Fatigue, unspecified type - Plan: CBC with Differential/Platelet, Comprehensive metabolic panel  RLS (restless legs syndrome)  Parasomnia, unspecified type  Attention deficit hyperactivity disorder (ADHD), unspecified ADHD type  Need for influenza vaccination - Plan: Flu Vaccine QUAD 6+ mos PF IM (Fluarix Quad PF)  Need for shingles vaccine - Plan: Varicella-zoster vaccine IM  Screening for lipid disorders - Plan: Lipid panel  Migraine without  aura and without status migrainosus, not intractable  S/P cervical spinal fusion He will continue be followed by neurosurgery but this seems to be doing nicely.  He will continue on his other medications.  Discussed the symptoms of sinus disease however at this time we will hold off on any intervention as he is getting better.  He will be given his shots for Shingrix and flu in approximately 1 week. Routine blood screening and follow-up based on that.  He was comfortable with this.

## 2020-08-23 ENCOUNTER — Other Ambulatory Visit: Payer: Self-pay | Admitting: Family Medicine

## 2020-08-23 LAB — COMPREHENSIVE METABOLIC PANEL
ALT: 24 IU/L (ref 0–44)
AST: 21 IU/L (ref 0–40)
Albumin/Globulin Ratio: 2.5 — ABNORMAL HIGH (ref 1.2–2.2)
Albumin: 4.8 g/dL (ref 3.8–4.8)
Alkaline Phosphatase: 63 IU/L (ref 44–121)
BUN/Creatinine Ratio: 18 (ref 10–24)
BUN: 18 mg/dL (ref 8–27)
Bilirubin Total: 0.6 mg/dL (ref 0.0–1.2)
CO2: 26 mmol/L (ref 20–29)
Calcium: 9.4 mg/dL (ref 8.6–10.2)
Chloride: 102 mmol/L (ref 96–106)
Creatinine, Ser: 1.01 mg/dL (ref 0.76–1.27)
GFR calc Af Amer: 92 mL/min/{1.73_m2} (ref >59)
GFR calc non Af Amer: 79 mL/min/{1.73_m2} (ref >59)
Globulin, Total: 1.9 g/dL (ref 1.5–4.5)
Glucose: 91 mg/dL (ref 65–99)
Potassium: 4.5 mmol/L (ref 3.5–5.2)
Sodium: 141 mmol/L (ref 134–144)
Total Protein: 6.7 g/dL (ref 6.0–8.5)

## 2020-08-23 LAB — LIPID PANEL
Chol/HDL Ratio: 5.5 ratio — ABNORMAL HIGH (ref 0.0–5.0)
Cholesterol, Total: 197 mg/dL (ref 100–199)
HDL: 36 mg/dL — ABNORMAL LOW (ref >39)
LDL Chol Calc (NIH): 127 mg/dL — ABNORMAL HIGH (ref 0–99)
Triglycerides: 191 mg/dL — ABNORMAL HIGH (ref 0–149)
VLDL Cholesterol Cal: 34 mg/dL (ref 5–40)

## 2020-08-23 LAB — CBC WITH DIFFERENTIAL/PLATELET
Basophils Absolute: 0 10*3/uL (ref 0.0–0.2)
Basos: 1 %
EOS (ABSOLUTE): 0.2 10*3/uL (ref 0.0–0.4)
Eos: 3 %
Hematocrit: 42.9 % (ref 37.5–51.0)
Hemoglobin: 14.2 g/dL (ref 13.0–17.7)
Immature Grans (Abs): 0 10*3/uL (ref 0.0–0.1)
Immature Granulocytes: 0 %
Lymphocytes Absolute: 1.3 10*3/uL (ref 0.7–3.1)
Lymphs: 21 %
MCH: 30.9 pg (ref 26.6–33.0)
MCHC: 33.1 g/dL (ref 31.5–35.7)
MCV: 93 fL (ref 79–97)
Monocytes Absolute: 0.4 10*3/uL (ref 0.1–0.9)
Monocytes: 6 %
Neutrophils Absolute: 4.2 10*3/uL (ref 1.4–7.0)
Neutrophils: 69 %
Platelets: 233 10*3/uL (ref 150–450)
RBC: 4.6 x10E6/uL (ref 4.14–5.80)
RDW: 13.6 % (ref 11.6–15.4)
WBC: 6 10*3/uL (ref 3.4–10.8)

## 2020-08-23 MED ORDER — SULFAMETHOXAZOLE-TRIMETHOPRIM 800-160 MG PO TABS: 1.0000 | ORAL_TABLET | Freq: Two times a day (BID) | ORAL | 0 refills | Status: DC

## 2020-08-23 NOTE — Progress Notes (Signed)
He is having UTI symptoms with dysuria. Will Tx with Septra.  Explained that I will need to follow up on this

## 2020-08-28 ENCOUNTER — Other Ambulatory Visit: Payer: Self-pay

## 2020-08-28 ENCOUNTER — Telehealth: Payer: Self-pay | Admitting: *Deleted

## 2020-08-28 ENCOUNTER — Other Ambulatory Visit (INDEPENDENT_AMBULATORY_CARE_PROVIDER_SITE_OTHER): Payer: BC Managed Care – PPO

## 2020-08-28 DIAGNOSIS — Z23 Encounter for immunization: Secondary | ICD-10-CM

## 2020-08-28 NOTE — Telephone Encounter (Signed)
Patient was here to get flu and 2nd shingles vaccine. He said that he was being treated for a UTI by and left a urine, I didn't test it yet as he said he was still taking the abx-would you like me to? He said he is still having symptoms. Also-he asked if you would text him or call him with an explanation of his lipid panel. He said that you said it looked but he states there were many numbers out of range.

## 2020-08-29 ENCOUNTER — Other Ambulatory Visit: Payer: BC Managed Care – PPO

## 2020-08-29 DIAGNOSIS — M542 Cervicalgia: Secondary | ICD-10-CM | POA: Diagnosis not present

## 2020-09-10 DIAGNOSIS — M542 Cervicalgia: Secondary | ICD-10-CM | POA: Diagnosis not present

## 2020-09-19 ENCOUNTER — Encounter: Payer: BC Managed Care – PPO | Admitting: Family Medicine

## 2020-09-19 DIAGNOSIS — M542 Cervicalgia: Secondary | ICD-10-CM | POA: Diagnosis not present

## 2020-09-24 DIAGNOSIS — M542 Cervicalgia: Secondary | ICD-10-CM | POA: Diagnosis not present

## 2020-09-30 DIAGNOSIS — M542 Cervicalgia: Secondary | ICD-10-CM | POA: Diagnosis not present

## 2020-10-15 DIAGNOSIS — L282 Other prurigo: Secondary | ICD-10-CM | POA: Diagnosis not present

## 2020-10-15 DIAGNOSIS — L2089 Other atopic dermatitis: Secondary | ICD-10-CM | POA: Diagnosis not present

## 2020-10-25 DIAGNOSIS — M542 Cervicalgia: Secondary | ICD-10-CM | POA: Diagnosis not present

## 2020-11-08 ENCOUNTER — Other Ambulatory Visit: Payer: Self-pay | Admitting: Adult Health

## 2020-11-08 ENCOUNTER — Other Ambulatory Visit: Payer: Self-pay | Admitting: Family Medicine

## 2020-11-08 NOTE — Telephone Encounter (Signed)
[facility] is requesting  to fill pt methylphenidate. Please advise [facility]

## 2020-11-10 ENCOUNTER — Other Ambulatory Visit: Payer: Self-pay | Admitting: Neurology

## 2020-11-13 ENCOUNTER — Ambulatory Visit: Payer: BC Managed Care – PPO | Admitting: Adult Health

## 2020-11-13 VITALS — BP 140/79 | HR 79 | Ht 72.0 in | Wt 186.0 lb

## 2020-11-13 DIAGNOSIS — G2581 Restless legs syndrome: Secondary | ICD-10-CM | POA: Diagnosis not present

## 2020-11-13 MED ORDER — TRAZODONE HCL 150 MG PO TABS: ORAL_TABLET | ORAL | 3 refills | Status: DC

## 2020-11-13 MED ORDER — GABAPENTIN 100 MG PO CAPS: 300.0000 mg | ORAL_CAPSULE | Freq: Every day | ORAL | 3 refills | Status: DC

## 2020-11-13 MED ORDER — CYCLOBENZAPRINE HCL 10 MG PO TABS: 10.0000 mg | ORAL_TABLET | Freq: Every day | ORAL | 3 refills | Status: DC

## 2020-11-13 MED ORDER — ROPINIROLE HCL 0.5 MG PO TABS: 0.5000 mg | ORAL_TABLET | Freq: Every day | ORAL | 3 refills | Status: DC

## 2020-11-13 NOTE — Addendum Note (Signed)
Addended by: Enedina Finner on: 11/13/2020 02:09 PM   Modules accepted: Orders

## 2020-11-13 NOTE — Patient Instructions (Signed)
Your Plan:   -Continue Requip 0.5 mg at bedtime -Continue trazodone 150 mg at bedtime -Continue gabapentin 300 mg at bedtime -Continue Flexeril 10 mg at bedtime  Thank you for coming to see [place] at [facility]. I hope we have been able to provide you high quality care today.  You may receive a patient satisfaction survey over the next few weeks. We would appreciate your feedback and comments so that we may continue to improve ourselves and the health of our patients.

## 2020-11-13 NOTE — Progress Notes (Signed)
PATIENT: Parker Silva DOB: 06-May-1956  REASON FOR VISIT: follow up HISTORY FROM: patient  HISTORY OF PRESENT ILLNESS: Today 11/13/20:  Parker Silva is a 65 year old male with a history of low back pain and restless leg syndrome.  He returns today for follow-up.  He is currently on Flexeril 10 mg at bedtime, gabapentin 300 mg at bedtime, Requip 0.5 mg at bedtime and trazodone 150 mg at bedtime.  He reports that this continues to work well for him.  Reports that he had neck surgery last year with Dr. Yetta Barre reports that he has recovered well.  Returns today for follow-up.  11/16/19: Parker Silva is a 65 year old male with a history of low back pain and restless leg syndrome.  He returns today for follow-up.  He continues to take Requip, trazodone, Flexeril and gabapentin before bedtime.  He states that this typically works fairly well for him.  He may wake up at 3 or 4 AM but typically can get back to sleep.  He states recently he has been waking up and will get a muscle cramp in the right calf.  He states that he has been working outdoors more.  He knows his water intake has decreased.  He does take magnesium supplement.  Returns today for an evaluation.  HISTORY 03/28/19:  Parker Silva is a 65 year old male with a history of low back pain and restless leg syndrome.  He returns today for follow-up.  He states that he has been experimenting with his medication.  He states that he is currently taking 1 tablet of Requip, 1 tablet of trazodone, 1 tablet of Flexeril and 1 tablet of gabapentin before bedtime.  He states that this combination has helped the most with his restless leg.  He states that he is able to get to sleep and stay asleep.  He states that he continues to notice hunger issues and feels that he has had weight gain.  However according to our records he is only gained 3 pounds since his visit in the office in October 2019.  He returns today for an evaluation.  REVIEW OF SYSTEMS: Out of  a complete 14 system review of symptoms, the patient complains only of the following symptoms, and all other reviewed systems are negative.  See HPI  ALLERGIES: No Known Allergies  HOME MEDICATIONS: Outpatient Medications Prior to Visit  Medication Sig Dispense Refill  . Biotin w/ Vitamins C & E (HAIR/SKIN/NAILS PO) Take 1 tablet by mouth daily.    Marland Kitchen buPROPion (WELLBUTRIN SR) 150 MG 12 hr tablet TAKE 1 TABLET ONCE DAILY. 90 tablet 3  . cyclobenzaprine (FLEXERIL) 10 MG tablet Take 1 tablet (10 mg total) by mouth at bedtime. 90 tablet 3  . ferrous sulfate 325 (65 FE) MG tablet Take 325 mg by mouth daily with breakfast.    . flurbiprofen (ANSAID) 100 MG tablet TAKE 1 TABLET TWICE DAILY AS NEEDED FOR HEADACHE. NO MORE THAN 4 DAYS/WEEK. 40 tablet 0  . gabapentin (NEURONTIN) 100 MG capsule Take 3 capsules (300 mg total) by mouth at bedtime. Must be seen for further refills. Call [phone]. 90 capsule 0  . Magnesium 500 MG TABS Take 500 mg by mouth daily.     . methocarbamol (ROBAXIN) 500 MG tablet Take 1 tablet (500 mg total) by mouth every 6 (six) hours as needed for muscle spasms. 45 tablet 0  . [START ON 11/17/2020] METHYLPHENIDATE 36 MG PO CR tablet TAKE 1 TABLET EACH DAY. 90 tablet 0  .  naproxen (NAPROSYN) 500 MG tablet Take 1 tablet (500 mg total) by mouth 2 (two) times daily with a meal. 30 tablet 0  . Probiotic Product (PROBIOTIC DAILY PO) Take 1 tablet by mouth daily. GX    . rizatriptan (MAXALT) 10 MG tablet Take 1 tablet (10 mg total) by mouth as needed (not had in 1 year as of 01-23-2015). May repeat in 2 hours if needed 10 tablet 1  . rOPINIRole (REQUIP) 0.5 MG tablet Take 1 tablet (0.5 mg total) by mouth at bedtime. 90 tablet 3  . traMADol (ULTRAM) 50 MG tablet Take 50 mg by mouth every 6 (six) hours as needed for moderate pain.    . traZODone (DESYREL) 150 MG tablet TAKE 1 & 1/2 TABLETS AT BEDTIME AS NEEDED FOR SLEEP. (Patient taking differently: Take 150 mg by mouth at bedtime.) 135  tablet 3  . doxycycline (VIBRAMYCIN) 100 MG capsule Take 1 capsule (100 mg total) by mouth 2 (two) times daily. (Patient not taking: No sig reported) 20 capsule 0  . HYDROcodone-acetaminophen (NORCO) 10-325 MG tablet Take 2 tablets by mouth every 4 (four) hours as needed for severe pain ((score 7 to 10)). 30 tablet 0  . HYDROcodone-acetaminophen (NORCO/VICODIN) 5-325 MG tablet Take 1 tablet by mouth every 6 (six) hours as needed for moderate pain.  (Patient not taking: Reported on 08/22/2020)    . sildenafil (REVATIO) 20 MG tablet TAKE UP TO 5 TABLETS DAILY AS NEEDED. (Patient not taking: Reported on 08/22/2020) 30 tablet 5  . sulfamethoxazole-trimethoprim (BACTRIM DS) 800-160 MG tablet Take 1 tablet by mouth 2 (two) times daily. 20 tablet 0   No facility-administered medications prior to visit.    PAST MEDICAL HISTORY: Past Medical History:  Diagnosis Date  . ADHD (attention deficit hyperactivity disorder)   . Allergy    RHINITIS-seasonal  . Anal fissure   . Arthritis    back,neck  . Dyslexia   . ED (erectile dysfunction)   . Hemorrhoids   . Hemorrhoids, internal, with bleeding 02/13/2015   Grade 2 hemorrhoids 02/13/15 band ligation right posterior hemorrhoidal bundle 03/04/15 band ligation right anterior hemorrhoidal bundle 04/15/15 band ligation left lateral hemorrhoidal bundle   . History of migraine headaches   . Sleep disturbance   . Tension headache     PAST SURGICAL HISTORY: Past Surgical History:  Procedure Laterality Date  . APPENDECTOMY    . COLONOSCOPY  2006  . HEMORRHOID BANDING    . POSTERIOR CERVICAL FUSION/FORAMINOTOMY N/A 04/08/2020   Procedure: POSTERIOR CERVICAL FUSION/FORAMINOTOMY CERVICAL TWO- CERVICAL SEVEN, LAMINECTOMY CERVICAL TWO- CERVICAL FIVE.;  Surgeon: Tia Alert, MD;  [facility] OR;  Service: Neurosurgery;  Laterality: N/A;  posterior  . TONSILLECTOMY      FAMILY HISTORY: Family History  Problem Relation Age of Onset  . Breast cancer Mother    . Diabetes type II Father   . Heart failure Father   . COPD Father   . Colon cancer Neg Hx   . Rectal cancer Neg Hx   . Stomach cancer Neg Hx     SOCIAL HISTORY: Social History   Socioeconomic History  . Marital status: Single    Spouse name: Not on file  . Number of children: 0  . Years of education: Not on file  . Highest education level: Not on file  Occupational History  . Occupation: owns Teacher, early years/pre: [employer]  Tobacco Use  . Smoking status: Never Smoker  . Smokeless tobacco:  Never Used  Vaping Use  . Vaping Use: Never used  Substance and Sexual Activity  . Alcohol use: Yes    Comment: occ  . Drug use: No  . Sexual activity: Not Currently  Other Topics Concern  . Not on file  Social History Narrative   Lives alone in a one story home in [place].   Social Determinants of Health   Financial Resource Strain: Not on file  Food Insecurity: Not on file  Transportation Needs: Not on file  Physical Activity: Not on file  Stress: Not on file  Social Connections: Not on file  Intimate Partner Violence: Not on file      PHYSICAL EXAM  Vitals:   11/13/20 1332  BP: 140/79  Pulse: 79  Weight: 186 lb (84.4 kg)  Height: 6' (1.829 m)   Body mass index is 25.23 kg/m.  Generalized: Well developed, in no acute distress   Neurological examination  Mentation: Alert oriented to time, place, history taking. Follows all commands speech and language fluent Cranial nerve II-XII: Pupils were equal round reactive to light. Extraocular movements were full, visual field were full on confrontational test.  Head turning and shoulder shrug  were normal and symmetric. Motor: The motor testing reveals 5 over 5 strength of all 4 extremities. Good symmetric motor tone is noted throughout.  Sensory: Sensory testing is intact to soft touch on all 4 extremities. No evidence of extinction is noted.  Coordination: Cerebellar testing reveals good  finger-nose-finger and heel-to-shin bilaterally.  Gait and station: Gait is normal.     DIAGNOSTIC DATA (LABS, IMAGING, TESTING) - I reviewed patient records, labs, notes, testing and imaging myself where available.  Lab Results  Component Value Date   WBC 6.0 08/22/2020   HGB 14.2 08/22/2020   HCT 42.9 08/22/2020   MCV 93 08/22/2020   PLT 233 08/22/2020      Component Value Date/Time   NA 141 08/22/2020 1424   K 4.5 08/22/2020 1424   CL 102 08/22/2020 1424   CO2 26 08/22/2020 1424   GLUCOSE 91 08/22/2020 1424   GLUCOSE 82 11/13/2016 1346   BUN 18 08/22/2020 1424   CREATININE 1.01 08/22/2020 1424   CREATININE 1.26 11/13/2016 1346   CALCIUM 9.4 08/22/2020 1424   PROT 6.7 08/22/2020 1424   ALBUMIN 4.8 08/22/2020 1424   AST 21 08/22/2020 1424   ALT 24 08/22/2020 1424   ALKPHOS 63 08/22/2020 1424   BILITOT 0.6 08/22/2020 1424   GFRNONAA 79 08/22/2020 1424   GFRAA 92 08/22/2020 1424   Lab Results  Component Value Date   CHOL 197 08/22/2020   HDL 36 (L) 08/22/2020   LDLCALC 127 (H) 08/22/2020   TRIG 191 (H) 08/22/2020   CHOLHDL 5.5 (H) 08/22/2020   No results found for: HGBA1C No results found for: VITAMINB12 Lab Results  Component Value Date   TSH 4.240 03/09/2019      ASSESSMENT AND PLAN 65 y.o. year old male  has a past medical history of ADHD (attention deficit hyperactivity disorder), Allergy, Anal fissure, Arthritis, Dyslexia, ED (erectile dysfunction), Hemorrhoids, Hemorrhoids, internal, with bleeding (02/13/2015), History of migraine headaches, Sleep disturbance, and Tension headache. here with :  1. Restless leg syndrome  -Continue Requip 0.5 mg at bedtime -Continue trazodone 150 mg at bedtime- ok to take extra 1/2 tablet if needed -Continue gabapentin 300 mg at bedtime -Continue Flexeril 10 mg at bedtime  Advised to follow-up in 1 year or sooner if needed  I spent 20  minutes of face-to-face and non-face-to-face time with patient.  This included  previsit chart review, lab review, study review, order entry, electronic health record documentation, patient education.  Butch Penny, MSN, NP-C 11/13/2020, 1:36 PM [facility] [address], [address] [place], [place] [address] [phone]

## 2020-11-17 NOTE — Progress Notes (Signed)
I have read the note, and I agree with the clinical assessment and plan.  Meshell Abdulaziz K Nirav Sweda   

## 2020-11-28 DIAGNOSIS — M542 Cervicalgia: Secondary | ICD-10-CM | POA: Diagnosis not present

## 2020-12-28 ENCOUNTER — Other Ambulatory Visit: Payer: Self-pay | Admitting: Family Medicine

## 2020-12-28 MED ORDER — AMOXICILLIN-POT CLAVULANATE 875-125 MG PO TABS: 1.0000 | ORAL_TABLET | Freq: Two times a day (BID) | ORAL | 0 refills | Status: DC

## 2020-12-28 NOTE — Progress Notes (Signed)
He has underlying allergies and is treating them.  Over the last 10 days he has gotten much worse with nasal congestion, drainage as well as malaise.  I will treat him with Augmentin for sinus infection.

## 2021-01-08 ENCOUNTER — Other Ambulatory Visit: Payer: Self-pay | Admitting: Family Medicine

## 2021-01-08 MED ORDER — AMOXICILLIN-POT CLAVULANATE 875-125 MG PO TABS: 1.0000 | ORAL_TABLET | Freq: Two times a day (BID) | ORAL | 0 refills | Status: DC

## 2021-02-05 ENCOUNTER — Other Ambulatory Visit: Payer: Self-pay | Admitting: Family Medicine

## 2021-02-05 NOTE — Telephone Encounter (Signed)
[facility] questing to fill pt methylphenidate. Please advise [facility]

## 2021-04-04 ENCOUNTER — Other Ambulatory Visit: Payer: Self-pay | Admitting: Adult Health

## 2021-05-06 ENCOUNTER — Other Ambulatory Visit: Payer: Self-pay | Admitting: Family Medicine

## 2021-05-06 NOTE — Telephone Encounter (Signed)
[facility] requesting to fill pt methylphenidate. Please advise [facility] 

## 2021-05-14 ENCOUNTER — Telehealth: Payer: Self-pay | Admitting: Family Medicine

## 2021-05-14 NOTE — Telephone Encounter (Signed)
Patient is going out of town on 9/24.  His concerta is refilled for 9/25.  Can you approve him to get on 9/24 with [facility].

## 2021-05-14 NOTE — Telephone Encounter (Signed)
Done [facility] 

## 2021-06-24 ENCOUNTER — Other Ambulatory Visit: Payer: Self-pay | Admitting: Family Medicine

## 2021-07-09 ENCOUNTER — Other Ambulatory Visit: Payer: Self-pay

## 2021-07-09 ENCOUNTER — Encounter: Payer: Self-pay | Admitting: Family Medicine

## 2021-07-09 ENCOUNTER — Ambulatory Visit: Payer: BC Managed Care – PPO | Admitting: Family Medicine

## 2021-07-09 VITALS — BP 112/78 | HR 88 | Temp 97.2°F | Wt 192.2 lb

## 2021-07-09 DIAGNOSIS — J0110 Acute frontal sinusitis, unspecified: Secondary | ICD-10-CM

## 2021-07-09 DIAGNOSIS — J301 Allergic rhinitis due to pollen: Secondary | ICD-10-CM

## 2021-07-09 DIAGNOSIS — F909 Attention-deficit hyperactivity disorder, unspecified type: Secondary | ICD-10-CM

## 2021-07-09 DIAGNOSIS — R197 Diarrhea, unspecified: Secondary | ICD-10-CM | POA: Diagnosis not present

## 2021-07-09 DIAGNOSIS — Z23 Encounter for immunization: Secondary | ICD-10-CM | POA: Diagnosis not present

## 2021-07-09 MED ORDER — AMOXICILLIN-POT CLAVULANATE 875-125 MG PO TABS: 1.0000 | ORAL_TABLET | Freq: Two times a day (BID) | ORAL | 0 refills | Status: DC

## 2021-07-09 NOTE — Progress Notes (Signed)
   Subjective:    Patient ID: Parker Silva, male    DOB: 1955-09-01, 65 y.o.   MRN: [id number]  HPI He is here for consult concerning multiple issues.  He has had cervical disks surgery a little over a year ago and seems to be recovering nicely from that.  He continues to be followed by Dr. Vickey Huger for his sleep-related issues including RLS.  Presently he is taking gabapentin, Requip, Flexeril and does railed help with all that.  He does have underlying allergies that usually bother him this time a year.  He started using a Nettie pot to help with that.  He complains of ear congestion, decreased hearing, slight dizziness with sinus congestion PND headache and some itchy watery eyes.  No sore throat, fever or chills. He also states that over the last 6 days he has had diarrhea but is not traveled anywhere, drink well water or been on no antibiotics. He does have underlying ADHD and is doing quite nicely on Concerta 36 mg.  It lasts roughly 10 hours and he is very happy with this.  He has had no untoward side effects from it.   Review of Systems     Objective:   Physical Exam Alert and in no distress.  Nasal mucosa is normal.  Slight tenderness over ethmoid and frontal sinuses.  Tympanic membranes and canals are normal. Pharyngeal area is normal. Neck is supple without adenopathy or thyromegaly. Cardiac exam shows a regular sinus rhythm without murmurs or gallops. Lungs are clear to auscultation.        Assessment & Plan:  Acute non-recurrent frontal sinusitis - Plan: amoxicillin-clavulanate (AUGMENTIN) 875-125 MG tablet  Attention deficit hyperactivity disorder (ADHD), unspecified ADHD type  Diarrhea, unspecified type  Allergic rhinitis due to pollen, unspecified seasonality  Need for influenza vaccination - Plan: Flu Vaccine QUAD 6+ mos PF IM (Fluarix Quad PF) He will continue on Concerta as he is doing well with that. Recommended trying Lomotil to help with his diarrhea. He is  to use Rhinocort and continue with the Nettie pot as well as Claritin to help with his allergies.

## 2021-07-09 NOTE — Patient Instructions (Addendum)
Try Rhinocort for your nose and either Claritin or Allegra Try Imodium for the diarrhea Use a probiotic

## 2021-07-14 DIAGNOSIS — H0102A Squamous blepharitis right eye, upper and lower eyelids: Secondary | ICD-10-CM | POA: Diagnosis not present

## 2021-07-14 DIAGNOSIS — H2513 Age-related nuclear cataract, bilateral: Secondary | ICD-10-CM | POA: Diagnosis not present

## 2021-07-14 DIAGNOSIS — H0102B Squamous blepharitis left eye, upper and lower eyelids: Secondary | ICD-10-CM | POA: Diagnosis not present

## 2021-07-14 DIAGNOSIS — H43812 Vitreous degeneration, left eye: Secondary | ICD-10-CM | POA: Diagnosis not present

## 2021-07-16 DIAGNOSIS — H43812 Vitreous degeneration, left eye: Secondary | ICD-10-CM | POA: Diagnosis not present

## 2021-08-05 ENCOUNTER — Other Ambulatory Visit: Payer: Self-pay | Admitting: Family Medicine

## 2021-08-05 DIAGNOSIS — F341 Dysthymic disorder: Secondary | ICD-10-CM

## 2021-08-05 NOTE — Telephone Encounter (Signed)
[facility] pt wellbutrin and methylphenidate. Please advise [facility]

## 2021-10-21 DIAGNOSIS — M4005 Postural kyphosis, thoracolumbar region: Secondary | ICD-10-CM | POA: Diagnosis not present

## 2021-11-01 ENCOUNTER — Other Ambulatory Visit: Payer: Self-pay | Admitting: Adult Health

## 2021-11-01 ENCOUNTER — Other Ambulatory Visit: Payer: Self-pay | Admitting: Family Medicine

## 2021-11-03 NOTE — Telephone Encounter (Signed)
[facility] to fill pt methyphenidate. Please advise [facility] ?

## 2021-11-13 ENCOUNTER — Ambulatory Visit: Payer: BC Managed Care – PPO | Admitting: Adult Health

## 2021-11-13 ENCOUNTER — Encounter: Payer: Self-pay | Admitting: Adult Health

## 2021-11-13 ENCOUNTER — Other Ambulatory Visit: Payer: Self-pay

## 2021-11-13 VITALS — BP 134/83 | HR 94 | Ht 72.0 in | Wt 183.0 lb

## 2021-11-13 DIAGNOSIS — G2581 Restless legs syndrome: Secondary | ICD-10-CM | POA: Diagnosis not present

## 2021-11-13 DIAGNOSIS — M47816 Spondylosis without myelopathy or radiculopathy, lumbar region: Secondary | ICD-10-CM | POA: Diagnosis not present

## 2021-11-13 DIAGNOSIS — G4762 Sleep related leg cramps: Secondary | ICD-10-CM | POA: Diagnosis not present

## 2021-11-13 MED ORDER — ROPINIROLE HCL 0.5 MG PO TABS: 0.5000 mg | ORAL_TABLET | Freq: Every day | ORAL | 1 refills | Status: DC

## 2021-11-13 MED ORDER — GABAPENTIN 100 MG PO CAPS: 300.0000 mg | ORAL_CAPSULE | Freq: Every day | ORAL | 3 refills | Status: DC

## 2021-11-13 MED ORDER — CYCLOBENZAPRINE HCL 10 MG PO TABS: 10.0000 mg | ORAL_TABLET | Freq: Every day | ORAL | 1 refills | Status: DC

## 2021-11-13 MED ORDER — TRAZODONE HCL 150 MG PO TABS: ORAL_TABLET | ORAL | 3 refills | Status: DC

## 2021-11-13 NOTE — Progress Notes (Signed)
? ? ?PATIENT: Parker Silva ?DOB: [birth year] ? ?REASON FOR VISIT: follow up ?HISTORY FROM: patient ? ?HISTORY OF PRESENT ILLNESS: ?Today 11/13/21: ? ?Mr. Clarene DukeMcManus is a 66 year old male with a history of low back pain and restless leg syndrome.  He returns today for follow-up.  He feels that his symptoms have remained fairly stable.  Reports that he used to wake up nightly at 3 AM but now he wakes up around 5 AM.  He remains on Flexeril 10 mg at bedtime, gabapentin 300 mg at bedtime, Requip 0.5 mg at bedtime and trazodone 150 mg at bedtime.  Reports that sometimes he does feel little sluggish in the mornings.  He denies any new symptoms.  He returns today for an evaluation. ? ?11/13/20: Mr. Clarene DukeMcManus is a 66 year old male with a history of low back pain and restless leg syndrome.  He returns today for follow-up.  He is currently on Flexeril 10 mg at bedtime, gabapentin 300 mg at bedtime, Requip 0.5 mg at bedtime and trazodone 150 mg at bedtime.  He reports that this continues to work well for him.  Reports that he had neck surgery last year with Dr. Yetta BarreJones reports that he has recovered well.  Returns today for follow-up. ? ?11/16/19: Mr. Clarene DukeMcManus is a 66 year old male with a history of low back pain and restless leg syndrome.  He returns today for follow-up.  He continues to take Requip, trazodone, Flexeril and gabapentin before bedtime.  He states that this typically works fairly well for him.  He may wake up at 3 or 4 AM but typically can get back to sleep.  He states recently he has been waking up and will get a muscle cramp in the right calf.  He states that he has been working outdoors more.  He knows his water intake has decreased.  He does take magnesium supplement.  Returns today for an evaluation. ? ?HISTORY 03/28/19: ?  ?Mr. Clarene DukeMcManus is a 66 year old male with a history of low back pain and restless leg syndrome.  He returns today for follow-up.  He states that he has been experimenting with his medication.  He  states that he is currently taking 1 tablet of Requip, 1 tablet of trazodone, 1 tablet of Flexeril and 1 tablet of gabapentin before bedtime.  He states that this combination has helped the most with his restless leg.  He states that he is able to get to sleep and stay asleep.  He states that he continues to notice hunger issues and feels that he has had weight gain.  However according to our records he is only gained 3 pounds since his visit in the office in October 2019.  He returns today for an evaluation. ? ?REVIEW OF SYSTEMS: Out of a complete 14 system review of symptoms, the patient complains only of the following symptoms, and all other reviewed systems are negative. ? ?See HPI ? ?ALLERGIES: ?No Known Allergies ? ?HOME MEDICATIONS: ?Outpatient Medications Prior to Visit  ?Medication Sig Dispense Refill  ? Biotin w/ Vitamins C & E (HAIR/SKIN/NAILS PO) Take 2,500 mg by mouth daily.    ? buPROPion (WELLBUTRIN SR) 150 MG 12 hr tablet TAKE 1 TABLET ONCE DAILY. 90 tablet 3  ? cyclobenzaprine (FLEXERIL) 10 MG tablet TAKE ONE TABLET BY MOUTH AT BEDTIME 30 tablet 0  ? ferrous sulfate 325 (65 FE) MG tablet Take 325 mg by mouth daily with breakfast.    ? gabapentin (NEURONTIN) 100 MG capsule Take 3 capsules (300  mg total) by mouth at bedtime. 90 capsule 0  ? Magnesium 500 MG TABS Take 500 mg by mouth daily.     ? METHYLPHENIDATE 36 MG PO CR tablet TAKE ONE TABLET BY MOUTH EVERY DAY 90 tablet 0  ? Probiotic Product (PROBIOTIC DAILY PO) Take 1 tablet by mouth daily. GX    ? rizatriptan (MAXALT) 10 MG tablet Take 1 tablet (10 mg total) by mouth as needed (not had in 1 year as of 01-23-2015). May repeat in 2 hours if needed 10 tablet 1  ? rOPINIRole (REQUIP) 0.5 MG tablet TAKE ONE TABLET BY MOUTH AT BEDTIME. 30 tablet 0  ? traZODone (DESYREL) 150 MG tablet TAKE 1 & 1/2 TABLETS AT BEDTIME AS NEEDED FOR SLEEP. 135 tablet 3  ? flurbiprofen (ANSAID) 100 MG tablet TAKE 1 TABLET TWICE DAILY AS NEEDED FOR HEADACHE. NO MORE THAN 4  DAYS/WEEK. (Patient not taking: Reported on 11/13/2021) 40 tablet 0  ? methocarbamol (ROBAXIN) 500 MG tablet Take 1 tablet (500 mg total) by mouth every 6 (six) hours as needed for muscle spasms. (Patient not taking: Reported on 07/09/2021) 45 tablet 0  ? naproxen (NAPROSYN) 500 MG tablet Take 1 tablet (500 mg total) by mouth 2 (two) times daily with a meal. (Patient not taking: Reported on 07/09/2021) 30 tablet 0  ? traMADol (ULTRAM) 50 MG tablet Take 50 mg by mouth every 6 (six) hours as needed for moderate pain. (Patient not taking: Reported on 11/13/2021)    ? amoxicillin-clavulanate (AUGMENTIN) 875-125 MG tablet Take 1 tablet by mouth 2 (two) times daily. (Patient not taking: Reported on 11/13/2021) 20 tablet 0  ? ?No facility-administered medications prior to visit.  ? ? ?PAST MEDICAL HISTORY: ?Past Medical History:  ?Diagnosis Date  ? ADHD (attention deficit hyperactivity disorder)   ? Allergy   ? RHINITIS-seasonal  ? Anal fissure   ? Arthritis   ? back,neck  ? Dyslexia   ? ED (erectile dysfunction)   ? Hemorrhoids   ? Hemorrhoids, internal, with bleeding 02/13/2015  ? Grade 2 hemorrhoids 02/13/15 band ligation right posterior hemorrhoidal bundle 03/04/15 band ligation right anterior hemorrhoidal bundle 04/15/15 band ligation left lateral hemorrhoidal bundle   ? History of migraine headaches   ? Sleep disturbance   ? Tension headache   ? ? ?PAST SURGICAL HISTORY: ?Past Surgical History:  ?Procedure Laterality Date  ? APPENDECTOMY    ? COLONOSCOPY  2006  ? HEMORRHOID BANDING    ? POSTERIOR CERVICAL FUSION/FORAMINOTOMY N/A 04/08/2020  ? Procedure: POSTERIOR CERVICAL FUSION/FORAMINOTOMY CERVICAL TWO- CERVICAL SEVEN, LAMINECTOMY CERVICAL TWO- CERVICAL FIVE.;  Surgeon: Tia Alert, MD;  Location: [facility] OR;  Service: Neurosurgery;  Laterality: N/A;  posterior  ? TONSILLECTOMY    ? ? ?FAMILY HISTORY: ?Family History  ?Problem Relation Age of Onset  ? Breast cancer Mother   ? Diabetes type II Father   ? Heart failure Father    ? COPD Father   ? Colon cancer Neg Hx   ? Rectal cancer Neg Hx   ? Stomach cancer Neg Hx   ? ? ?SOCIAL HISTORY: ?Social History  ? ?Socioeconomic History  ? Marital status: Single  ?  Spouse name: Not on file  ? Number of children: 0  ? Years of education: Not on file  ? Highest education level: Not on file  ?Occupational History  ? Occupation: owns Visual merchandiser business  ?  Employer: [employer]  ?Tobacco Use  ? Smoking status: Never  ? Smokeless tobacco:  Never  ?Vaping Use  ? Vaping Use: Never used  ?Substance and Sexual Activity  ? Alcohol use: Yes  ?  Comment: occ  ? Drug use: No  ? Sexual activity: Not Currently  ?Other Topics Concern  ? Not on file  ?Social History Narrative  ? Lives alone in a one story home in [place].  ? ?Social Determinants of Health  ? ?Financial Resource Strain: Not on file  ?Food Insecurity: Not on file  ?Transportation Needs: Not on file  ?Physical Activity: Not on file  ?Stress: Not on file  ?Social Connections: Not on file  ?Intimate Partner Violence: Not on file  ? ? ? ? ?PHYSICAL EXAM ? ?Vitals:  ? 11/13/21 1303  ?BP: 134/83  ?Pulse: 94  ?Weight: 183 lb (83 kg)  ?Height: 6' (1.829 m)  ? ? ?Body mass index is 24.82 kg/m?. ? ?Generalized: Well developed, in no acute distress  ? ?Neurological examination  ?Mentation: Alert oriented to time, place, history taking. Follows all commands speech and language fluent ?Cranial nerve II-XII: Pupils were equal round reactive to light. Extraocular movements were full, visual field were full on confrontational test.  Head turning and shoulder shrug  were normal and symmetric. ?Motor: The motor testing reveals 5 over 5 strength of all 4 extremities. Good symmetric motor tone is noted throughout.  ?Sensory: Sensory testing is intact to soft touch on all 4 extremities. No evidence of extinction is noted.  ?Coordination: Cerebellar testing reveals good finger-nose-finger and heel-to-shin bilaterally.  ?Gait and station: Gait is normal.   ?  ? ?DIAGNOSTIC DATA (LABS, IMAGING, TESTING) ?- I reviewed patient records, labs, notes, testing and imaging myself where available. ? ?Lab Results  ?Component Value Date  ? WBC 6.0 08/22/2020  ?

## 2021-11-17 IMAGING — MR MR CERVICAL SPINE W/O CM
4 of 5 series · 26 of 48 positions shown · non-contrast
Comparison: Prior MRI from 04/28/2014.

CLINICAL DATA: [place] initial evaluation for chronic neck pain.

EXAM:
MRI CERVICAL SPINE WITHOUT CONTRAST
TECHNIQUE: Multiplanar, multisequence MR imaging of the cervical spine was
performed. No intravenous contrast was administered.

[Series 5: T1 · sagittal · 3.0mm · 0.66mm/px · 6 of 15 slices shown]
[im 1/15]
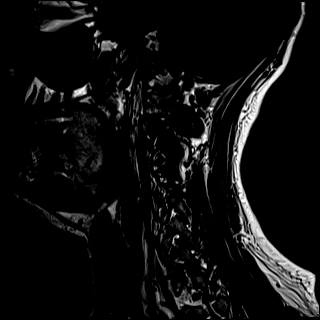
[im 3/15]
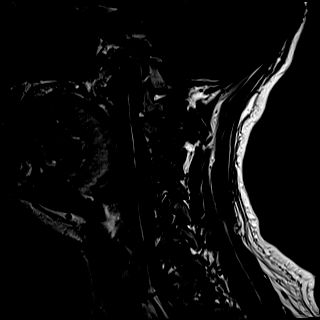
[im 6/15]
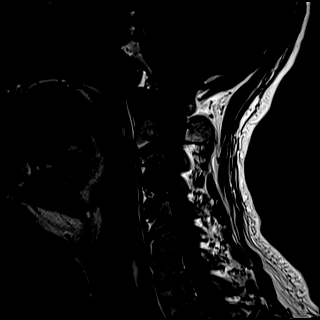
[im 9/15]
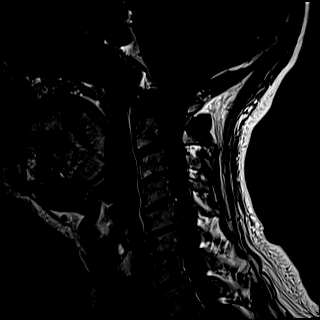
[im 12/15]
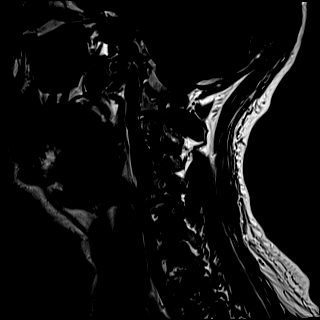
[im 15/15]
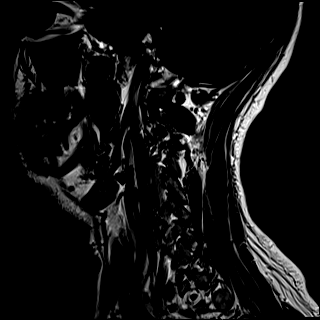

[Series 6: T2 · sagittal · 3.0mm · 0.55mm/px · 6 of 15 slices shown (1 of 2)]
[im 1/15]
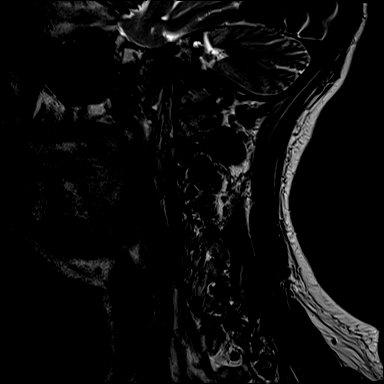
[im 3/15]
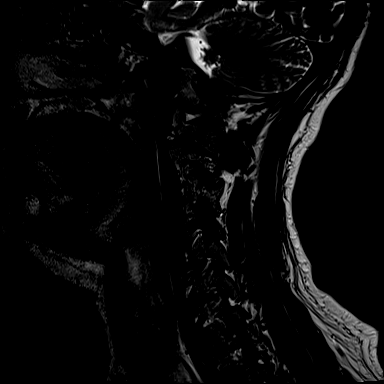
[im 6/15]
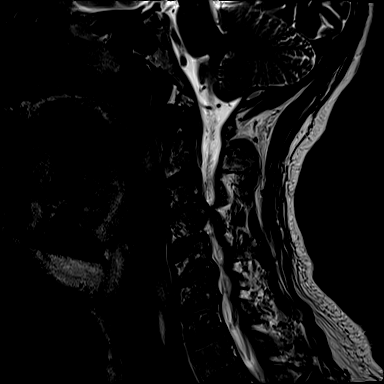
[im 9/15]
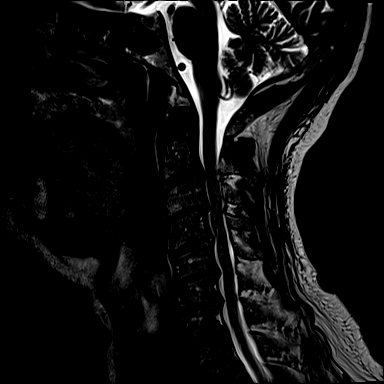
[im 12/15]
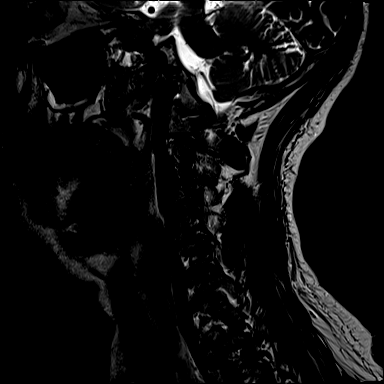
[im 15/15]
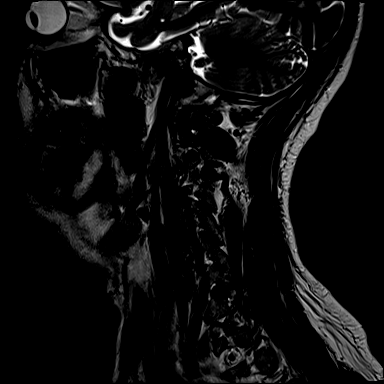

[Series 7: STIR · sagittal · 3.0mm · 0.33mm/px · 5 of 15 slices shown]
[im 1/15]
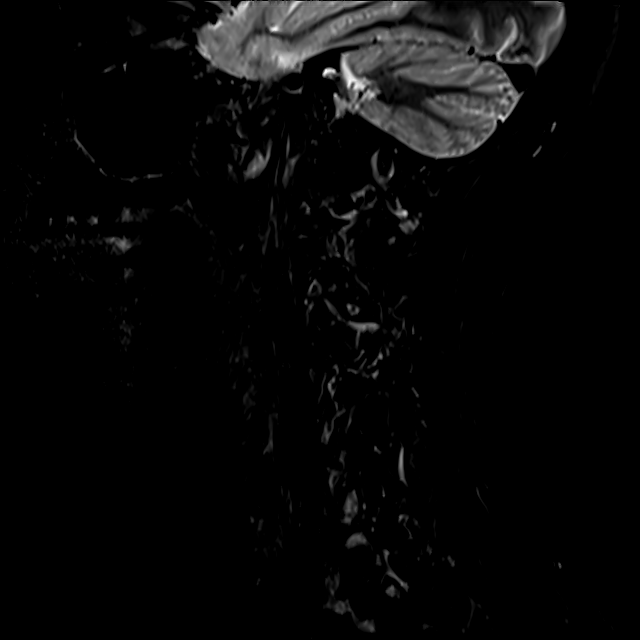
[im 3/15]
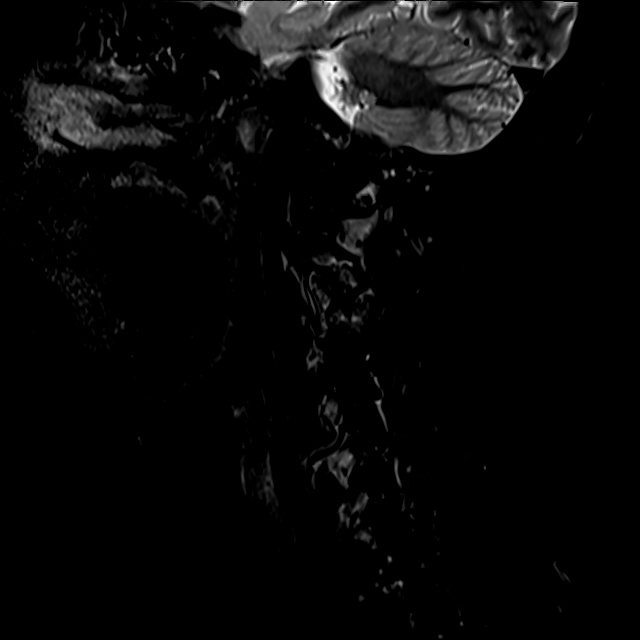
[im 6/15]
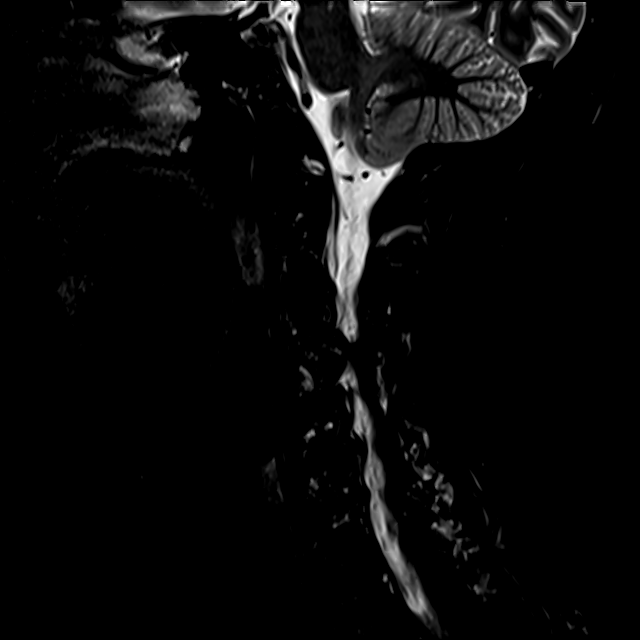
[im 9/15]
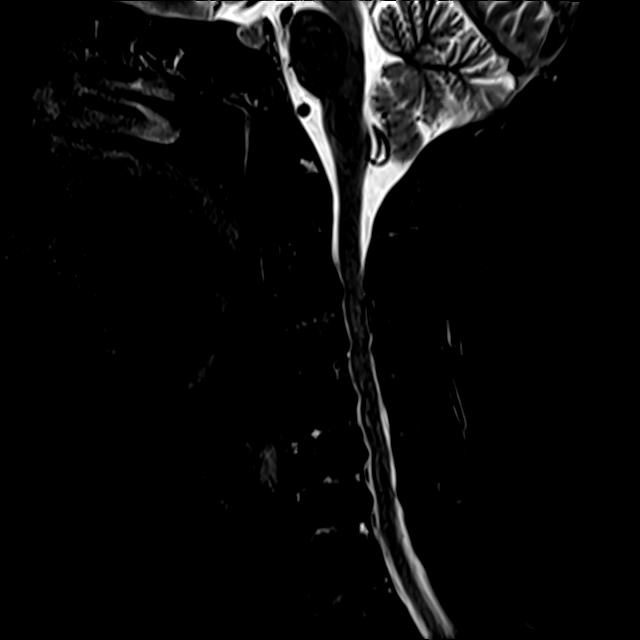
[im 15/15]
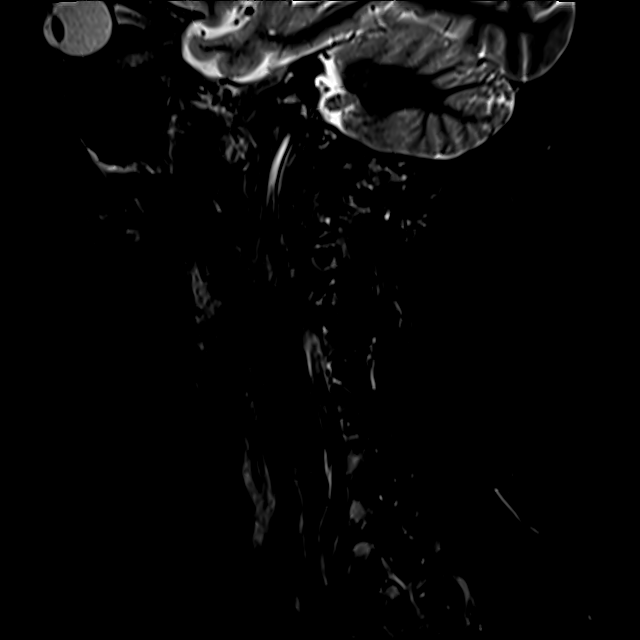

[Series 8: T2 · axial · 3.0mm · 0.50mm/px · z∈[-43,+63]mm · 9 of 35 slices shown (2 of 2)]
[im 1/35]
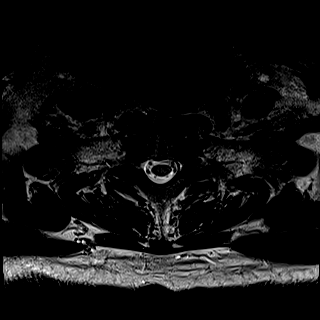
[im 5/35]
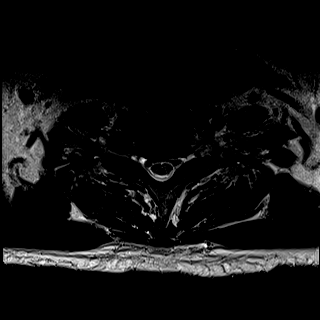
[im 10/35]
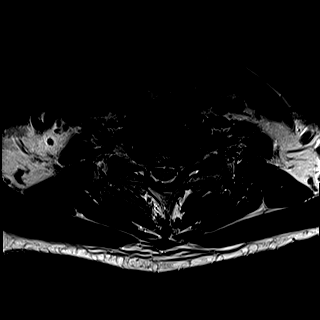
[im 15/35]
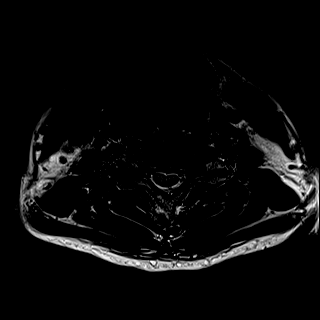
[im 18/35]
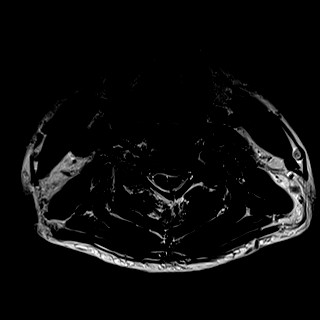
[im 20/35]
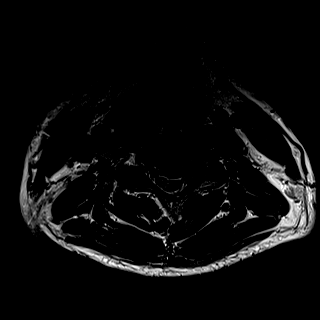
[im 25/35]
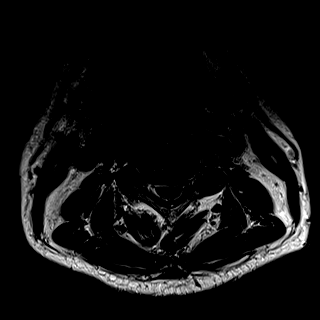
[im 30/35]
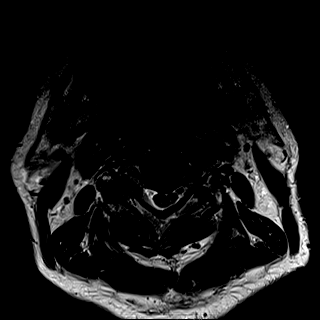
[im 35/35]
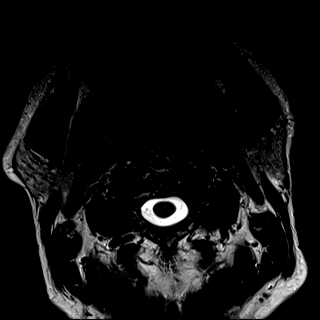

[26 of 48 positions shown; findings below may reference images not displayed]

FINDINGS: Alignment: Straightening of the normal cervical lordosis with
underlying mild levoscoliosis. Trace anterolisthesis of C2 on C3,
with trace retrolisthesis of C3 on C4. Findings chronic and facet
mediated.

Vertebrae: Vertebral body height maintained without evidence for
acute or chronic fracture. Bone marrow signal intensity diffusely
heterogeneous but within normal limits. No discrete or worrisome
osseous lesions. Reactive marrow edema seen about the right C2-3
facet due to facet arthritis (series 7, image 3). No other abnormal
marrow edema.

Cord: Signal intensity within the cervical spinal cord is normal.

Posterior Fossa, vertebral arteries, paraspinal tissues: Visualized
brain and posterior fossa within normal limits. Craniocervical
junction normal. Paraspinous and prevertebral soft tissues within
normal limits. Normal intravascular flow voids seen within the
vertebral arteries bilaterally.

Disc levels:

C2-C3: Central disc protrusion indents the ventral thecal sac,
contacting the ventral spinal cord. Mild spinal stenosis without
significant cord deformity. Severe right with mild left facet
degeneration. Resultant mild right C3 foraminal stenosis. No
significant left foraminal encroachment.

C3-C4: Chronic intervertebral disc space narrowing with diffuse
degenerative disc osteophyte. Broad posterior component flattens and
effaces the ventral thecal sac, asymmetric to the right.
Superimposed facet and ligament flavum hypertrophy. Resultant
mild-to-moderate spinal stenosis. Severe right with moderate left C4
foraminal narrowing.

C4-C5: Chronic intervertebral disc space narrowing with diffuse
degenerative disc osteophyte, asymmetric to the left. Broad
posterior component flattens and partially faces the ventral thecal
sac, greater on the left. Secondary mild to moderate spinal stenosis
with mild flattening of the ventral spinal cord. Severe left with
moderate right C5 foraminal narrowing.

C5-C6: Chronic intervertebral disc space narrowing with diffuse
degenerative disc osteophyte. Broad posterior component effaces the
ventral thecal sac. Superimposed ligament flavum hypertrophy.
Resultant mild to moderate spinal stenosis. Severe bilateral C6
foraminal stenosis, left worse than right.

C6-C7: Chronic intervertebral disc space narrowing with diffuse
degenerative disc osteophyte. Flattening of the ventral thecal sac.
Superimposed ligament flavum hypertrophy. Mild spinal stenosis.
Severe left with moderate right C7 foraminal narrowing.

C7-T1: Small central disc protrusion mildly indents the ventral
thecal sac. Superimposed mild facet hypertrophy. No significant
spinal stenosis. Mild bilateral C8 foraminal narrowing.

Visualized upper thoracic spine demonstrates no significant finding.
IMPRESSION: 1. Moderate multilevel cervical spondylosis with resultant mild to
moderate diffuse spinal stenosis at C2-3 through C6-7.
2. Multifactorial degenerative changes with resultant moderate to
severe bilateral C4 through C7 foraminal stenosis as above.
3. Reactive marrow edema about the right C2-3 facet due to facet
arthritis. Finding could serve as a source for neck pain.

## 2021-11-17 IMAGING — MR MR LUMBAR SPINE W/O CM
4 of 5 series · 17 of 48 positions shown · non-contrast
Comparison: Prior MRI from 06/03/2018.

CLINICAL DATA: Initial evaluation for chronic lower back pain with
bilateral leg and buttock pain.

EXAM:
MRI LUMBAR SPINE WITHOUT CONTRAST
TECHNIQUE: Multiplanar, multisequence MR imaging of the lumbar spine was
performed. No intravenous contrast was administered.

[Series 5: T2 · sagittal · 4.0mm · 0.73mm/px · 6 of 15 slices shown (1 of 2)]
[im 1/15]
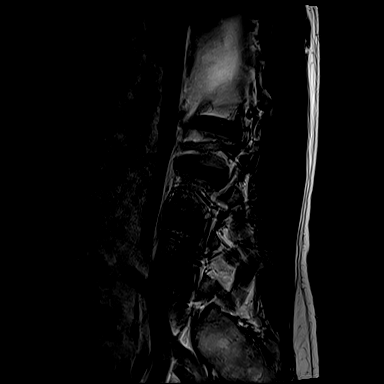
[im 3/15]
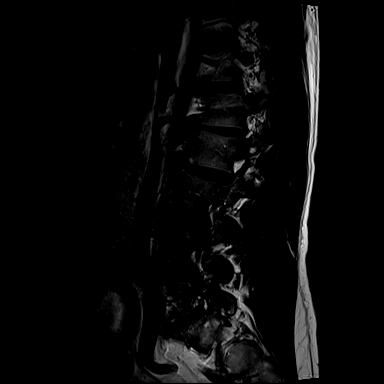
[im 6/15]
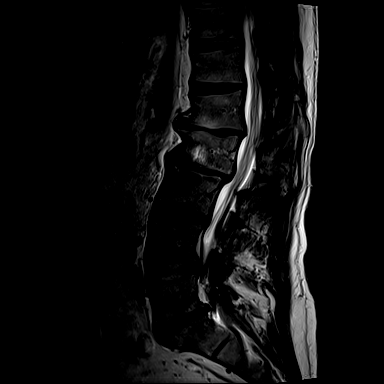
[im 9/15]
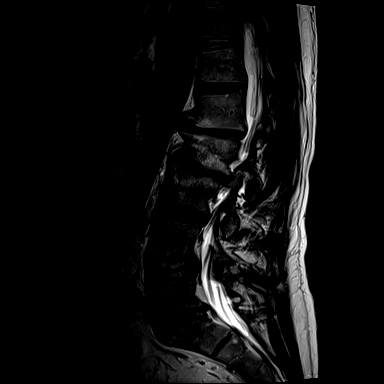
[im 12/15]
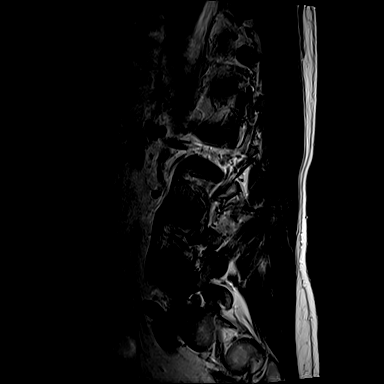
[im 15/15]
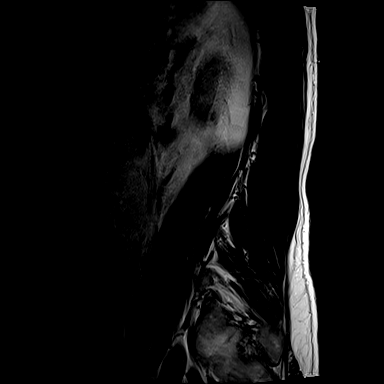

[Series 6: T1 · sagittal · 4.0mm · 0.73mm/px · 3 of 15 slices shown (1 of 2)]
[im 3/15]
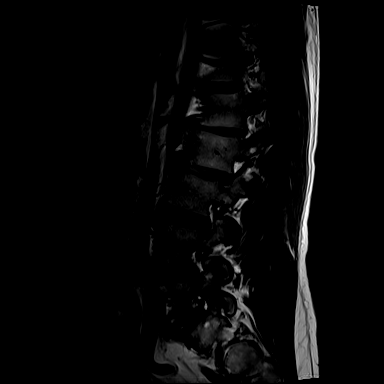
[im 9/15]
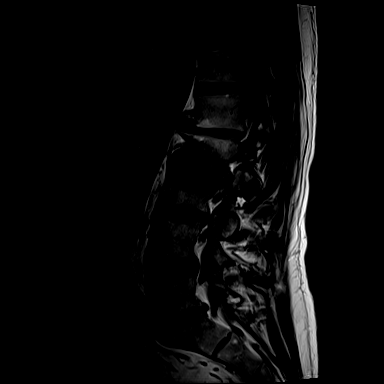
[im 15/15]
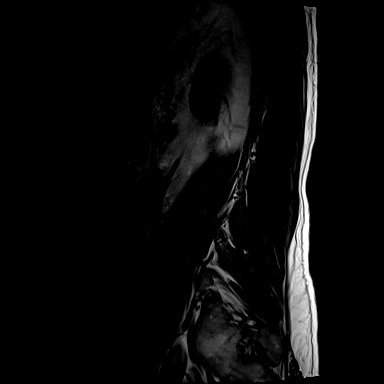

[Series 10: T1 · axial · 4.0mm · 0.28mm/px · z∈[-78,+84]mm · 3 of 41 slices shown (2 of 2)]
[im 6/41]
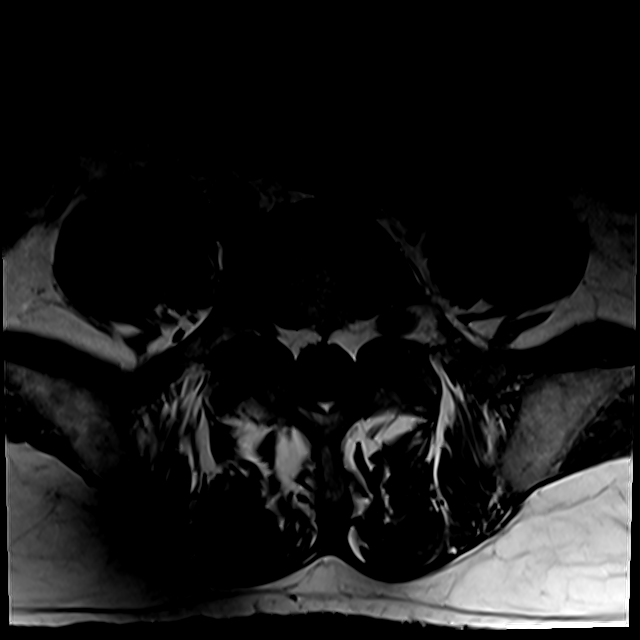
[im 21/41]
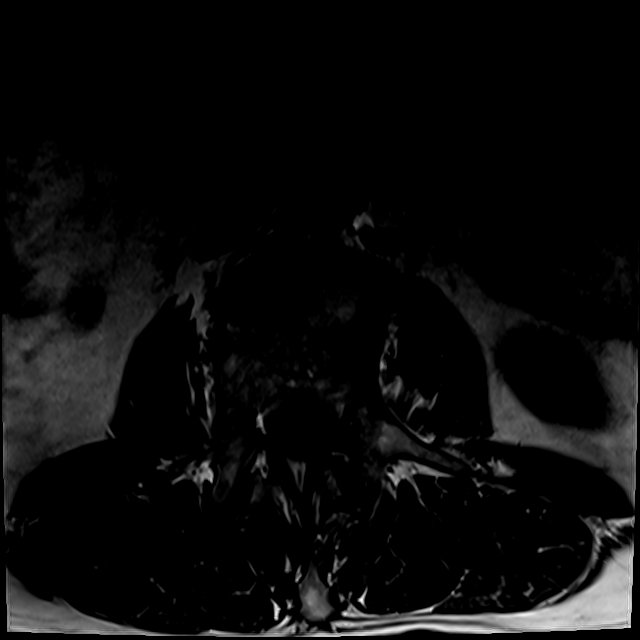
[im 35/41]
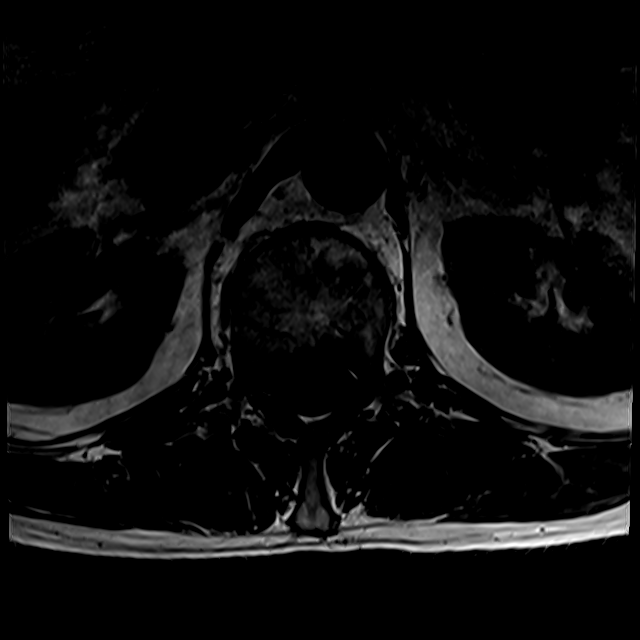

[Series 13: T2 · axial · 4.0mm · 0.28mm/px · z∈[-102,+84]mm · 5 of 41 slices shown (2 of 2)]
[im 1/41]
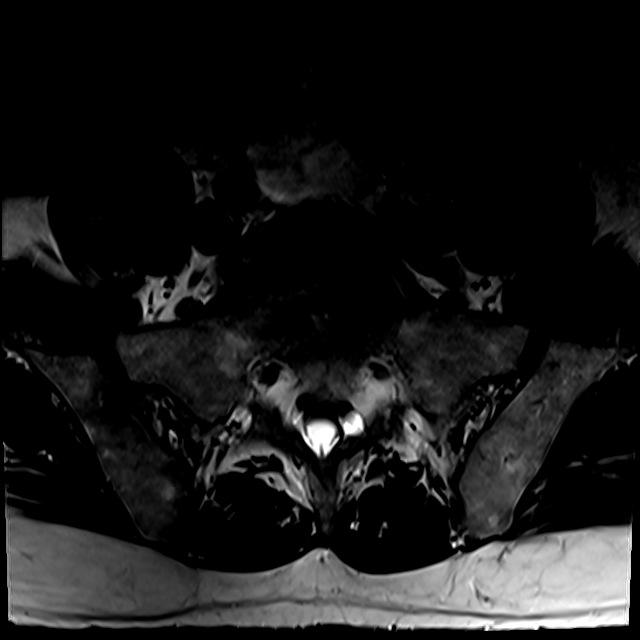
[im 6/41]
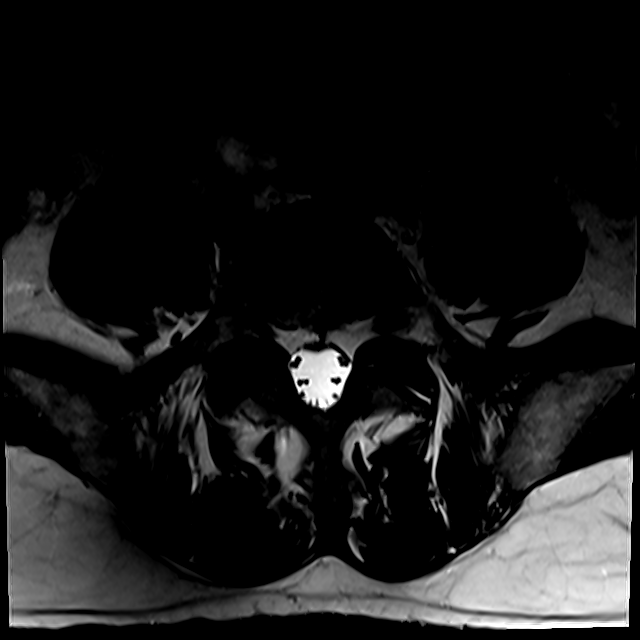
[im 12/41]
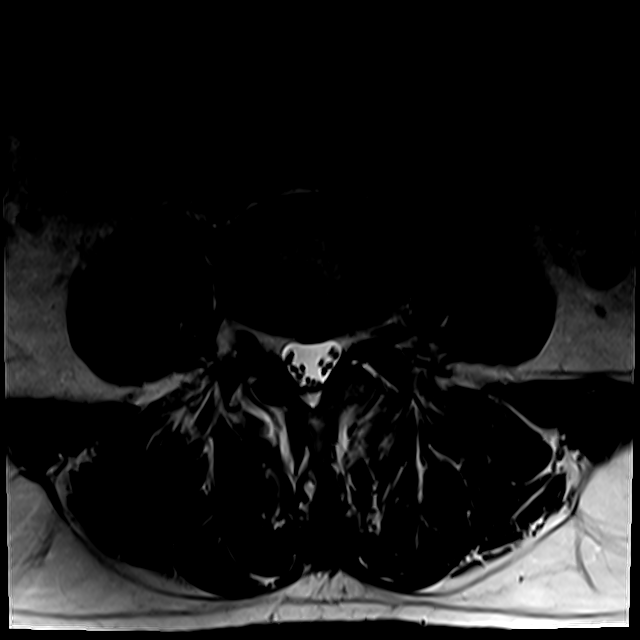
[im 21/41]
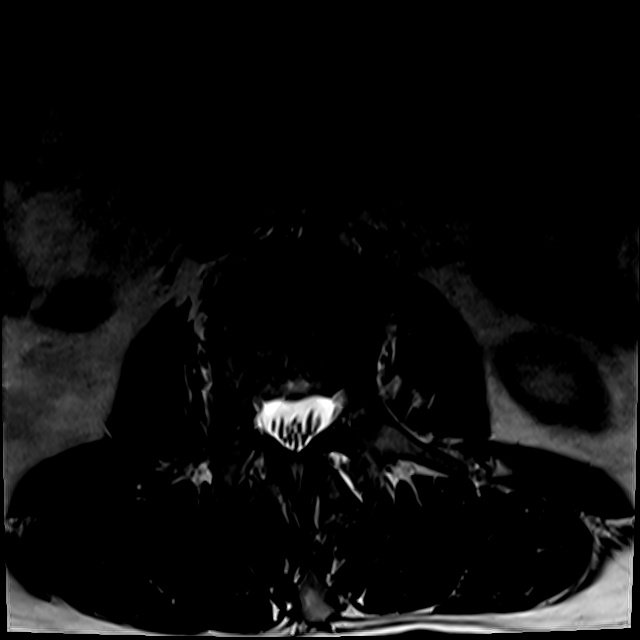
[im 35/41]
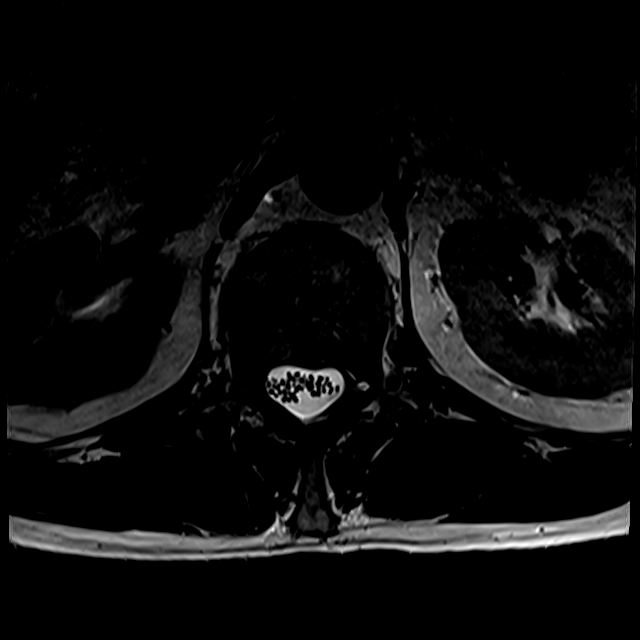

[17 of 48 positions shown; findings below may reference images not displayed]

FINDINGS: Segmentation: Standard. Lowest well-formed disc space labeled the
L5-S1 level.

Alignment: Dextroscoliosis with apex at L2-3. 4 mm retrolisthesis of
L1 on L2 and L2 on L3, chronic and facet mediated. Trace
retrolisthesis of L3 on L4 noted as well.

Vertebrae: Vertebral body height maintained without evidence for
acute or chronic fracture. Bone marrow signal intensity within
normal limits. 17 mm benign hemangioma noted within the L2 vertebral
body. No other discrete or worrisome osseous lesions. Prominent
discogenic reactive endplate changes present about the left aspect
of the L2-3 interspace due to scoliotic curvature. More mild
degenerative changes noted at the L5-S1 level on the right. No other
abnormal marrow edema.

Conus medullaris and cauda equina: Conus extends to the T12-L1
level. Conus and cauda equina appear normal.

Paraspinal and other soft tissues: Paraspinous soft tissues within
normal limits. Multiple scattered benign appearing T2 hyperintense
cyst noted within the kidneys bilaterally. Visualized visceral
structures otherwise unremarkable.

Disc levels:

L1-2: Retrolisthesis. Chronic intervertebral disc space narrowing
with diffuse disc bulge and disc desiccation. Superimposed reactive
endplate spurring. Superimposed mild bilateral facet hypertrophy. No
significant spinal stenosis. Foramina remain patent.

L2-3: Retrolisthesis. Advanced degenerative intervertebral disc
space narrowing with disc desiccation and diffuse disc bulge. Disc
bulging slightly asymmetric to the left. Associated reactive
endplate changes with marginal endplate spurring and marrow edema,
most pronounced on the left. Moderate bilateral facet hypertrophy
with associated small joint effusions. Resultant mild to moderate
bilateral lateral recess stenosis, left greater than right, with
mild narrowing of the central canal. Foramina remain patent.

L3-4: Disc desiccation with mild disc bulge. Superimposed shallow
left foraminal to extraforaminal disc protrusion with associated
annular fissure (series 13, image 24). This closely approximates the
exiting left L3 nerve root without frank neural impingement.
Moderate bilateral facet hypertrophy. Resultant mild narrowing of
the lateral recesses bilaterally. Mild bilateral L3 foraminal
stenosis.

L4-5: Negative interspace. Moderate bilateral facet hypertrophy.
Resultant mild narrowing of the left lateral recess. Central canal
remains patent. No significant foraminal encroachment.

L5-S1: Chronic intervertebral disc space narrowing with diffuse disc
bulge and disc desiccation. Associated reactive endplate changes
with marginal endplate osteophytic spurring. Mild to moderate right
worse than left facet hypertrophy. Associated small joint effusion
on the left. No significant spinal stenosis. Mild to moderate
bilateral L5 foraminal narrowing, slightly worse on the right.
IMPRESSION: 1. Advanced degenerative disc space narrowing with reactive endplate
changes about the L2-3 interspace, which could contribute to
underlying back pain.
2. Multifactorial degenerative changes throughout the lumbar spine
as above. Resultant mild to moderate bilateral lateral recess
stenosis at L2-3 and L3-4 as above.
3. Mild-to-moderate bilateral L5 foraminal stenosis related to disc
bulge, reactive endplate changes, and facet degeneration, slightly
worse on the right.

## 2021-11-21 ENCOUNTER — Other Ambulatory Visit: Payer: Self-pay | Admitting: Family Medicine

## 2021-11-21 ENCOUNTER — Telehealth: Payer: Self-pay

## 2021-11-21 MED ORDER — COTEMPLA XR-ODT 25.9 MG PO TBED: 1.0000 | EXTENDED_RELEASE_TABLET | Freq: Every day | ORAL | 0 refills | Status: DC

## 2021-11-21 NOTE — Progress Notes (Signed)
Due to [place] shortage of ADD mhis med ineds, I will call  ?

## 2021-11-21 NOTE — Telephone Encounter (Signed)
P.A. COTEMPLA

## 2021-11-24 ENCOUNTER — Telehealth: Payer: Self-pay | Admitting: Family Medicine

## 2021-11-24 MED ORDER — METHYLPHENIDATE HCL ER (OSM) 36 MG PO TBCR: 36.0000 mg | EXTENDED_RELEASE_TABLET | Freq: Every day | ORAL | 0 refills | Status: DC

## 2021-11-24 NOTE — Telephone Encounter (Signed)
Pt called and [facility] is out of contempla he would like it changed to the [facility] on [address], [place], [place] [address] ?

## 2021-11-25 NOTE — Telephone Encounter (Signed)
P.A. denied, for ODT, appeal letter typed and faxed

## 2021-12-01 ENCOUNTER — Other Ambulatory Visit: Payer: Self-pay | Admitting: Adult Health

## 2021-12-01 NOTE — Telephone Encounter (Signed)
Insurance has still denied Cotempla XR-ODT ? ?Pt must try one of the following medications. Please send in ? ?Amphetamine/Dextroamphetamine XR Capsule (generic Adderall XR) ?Dexmethylphenidate ER Capsules (generic Focalin XR) or Vyvanse ?

## 2021-12-02 ENCOUNTER — Encounter: Payer: Self-pay | Admitting: Family Medicine

## 2021-12-02 ENCOUNTER — Ambulatory Visit: Payer: BC Managed Care – PPO | Admitting: Family Medicine

## 2021-12-02 ENCOUNTER — Ambulatory Visit
Admission: RE | Admit: 2021-12-02 | Discharge: 2021-12-02 | Disposition: A | Discharge status: Home / Self Care | Payer: BC Managed Care – PPO | Source: Ambulatory Visit | Attending: Family Medicine | Admitting: Family Medicine

## 2021-12-02 VITALS — BP 120/80 | HR 83 | Temp 97.7°F | Wt 187.0 lb

## 2021-12-02 DIAGNOSIS — N489 Disorder of penis, unspecified: Secondary | ICD-10-CM | POA: Diagnosis not present

## 2021-12-02 DIAGNOSIS — M25562 Pain in left knee: Secondary | ICD-10-CM | POA: Diagnosis not present

## 2021-12-02 NOTE — Progress Notes (Signed)
? ?  Subjective:  ? ? Patient ID: Parker Silva, male    DOB: Jan 04, 1956, 66 y.o.   MRN: [id number] ? ?HPI ?He states that while playing pickle ball on Sunday he lunged forward on his left knee with it slightly bent and felt excruciating pain mainly laterally.  He has continued to bother him since then and he has been using ice and occasionally heat.  He has noted no popping locking or swelling. ?He also states that he has noted swelling and slight discomfort at the base of the penis that occurred after sexual activity.  He felt as if the penis got stretched too far distally.  It does get worse with full erection. ? ? ?Review of Systems ? ?   ?Objective:  ? Physical Exam ?Exam of the penis does show a cordlike lesion horizontal in nature at the base of the penis dorsal surface ?Left knee exam shows no effusion.  Negative anterior drawer.  Slight posterolateral joint line tenderness.  No medial tenderness.  Medial and lateral collateral ligaments intact.  Negative McMurray's testing.  Patella is nontender ? ? ? ?   ?Assessment & Plan:  ?Acute pain of left knee - Plan: DG Knee Complete 4 Views Left ? ?Penile lesion - Plan: Ambulatory referral to Urology ?I explained that I was not sure what the penile lesion was all about but will refer to urology for their input. ? ?I will get an x-ray to make sure we will see any bony abnormalities otherwise treated conservatively with ice switching to heat for 20 minutes 3 times per day.  Recheck here in about 2 weeks. ? ?

## 2021-12-02 NOTE — Patient Instructions (Signed)
Ice do heat for the first 48 hours roughly 48 this continue with ice today for 20 minutes 3 times per day you can take 8 to 800 mg of ibuprofen 3 times per day set up an appointment to see me again in about 2 weeks  ?

## 2021-12-02 NOTE — Telephone Encounter (Signed)
Pt got concerta but would like a 90 day going forward. He will check with [facility] to see if they have it back in [place] and let us know ?

## 2021-12-09 ENCOUNTER — Telehealth: Payer: Self-pay

## 2021-12-09 NOTE — Telephone Encounter (Signed)
Parker Silva with [facility] called states she got a referral for patient and wanted to confirm that patient was indeed referred to dermatology, and is this for the penile lesion? Please send this back to [facility] and she will handle

## 2021-12-14 NOTE — Telephone Encounter (Signed)
Kim handled °

## 2021-12-18 DIAGNOSIS — M47816 Spondylosis without myelopathy or radiculopathy, lumbar region: Secondary | ICD-10-CM | POA: Diagnosis not present

## 2021-12-23 ENCOUNTER — Ambulatory Visit: Payer: BC Managed Care – PPO | Admitting: Family Medicine

## 2021-12-23 VITALS — BP 126/84 | HR 92 | Temp 97.5°F | Wt 185.2 lb

## 2021-12-23 DIAGNOSIS — M25562 Pain in left knee: Secondary | ICD-10-CM

## 2021-12-23 DIAGNOSIS — N529 Male erectile dysfunction, unspecified: Secondary | ICD-10-CM | POA: Diagnosis not present

## 2021-12-23 MED ORDER — SILDENAFIL CITRATE 20 MG PO TABS: ORAL_TABLET | ORAL | 0 refills | Status: DC

## 2021-12-23 MED ORDER — LIDOCAINE HCL 1 % IJ SOLN
3.0000 mL | Freq: Once | INTRAMUSCULAR | Status: AC
  Administered 2021-12-23: 3 mL via INTRADERMAL

## 2021-12-23 MED ORDER — TRIAMCINOLONE ACETONIDE 40 MG/ML IJ SUSP
40.0000 mg | Freq: Once | INTRAMUSCULAR | Status: AC
  Administered 2021-12-23: 40 mg via INTRAMUSCULAR

## 2021-12-23 NOTE — Progress Notes (Signed)
? ?  Subjective:  ? ? Patient ID: Parker Silva, male    DOB: 09-Jul-1956, 66 y.o.   MRN: [id number] ? ?HPI ?He continues have difficulty with left knee pain.  He especially notes that at the end of the day it is causing more difficulty and he does point to the lateral and posterior aspect of the knee.  He notes that it is now causing changes in his gait because of the pain and he is concerned about other injuries. ?He also notes erectile dysfunction issues.  He has been given sildenafil in the past which has helped.  He would like a refill.  He is also finally scheduled to see urology for the penile lesion. ? ?Review of Systems ? ?   ?Objective:  ? Physical Exam ?Left knee exam does show pain with lateral McMurray's testing.  Ligaments intact.  Negative anterior drawer.  No effusion noted. ? ? ? ?   ?Assessment & Plan:  ?Acute pain of left knee - Plan: Ambulatory referral to Physical Therapy ? ?Erectile dysfunction, unspecified erectile dysfunction type - Plan: sildenafil (REVATIO) 20 MG tablet ?The left knee was prepped laterally.  The joint line was identified.  40 mg of Kenalog and 3 cc of Xylocaine was injected without difficulty.  He tolerated the procedure well.  Referral will be made for physical therapy however if continued difficulty, orthopedic referral will be made. ? ?

## 2021-12-23 NOTE — Addendum Note (Signed)
Addended by: Renelda Loma on: 12/23/2021 04:41 PM ? ? Modules accepted: Orders ? ?

## 2021-12-26 ENCOUNTER — Other Ambulatory Visit: Payer: Self-pay | Admitting: Family Medicine

## 2021-12-26 MED ORDER — METHYLPHENIDATE HCL ER (OSM) 36 MG PO TBCR: 36.0000 mg | EXTENDED_RELEASE_TABLET | Freq: Every day | ORAL | 0 refills | Status: DC

## 2022-01-01 DIAGNOSIS — N486 Induration penis plastica: Secondary | ICD-10-CM | POA: Diagnosis not present

## 2022-01-13 DIAGNOSIS — M47816 Spondylosis without myelopathy or radiculopathy, lumbar region: Secondary | ICD-10-CM | POA: Diagnosis not present

## 2022-01-28 ENCOUNTER — Telehealth: Payer: Self-pay | Admitting: Family Medicine

## 2022-01-28 MED ORDER — METHYLPHENIDATE HCL ER (OSM) 36 MG PO TBCR
36.0000 mg | EXTENDED_RELEASE_TABLET | Freq: Every day | ORAL | 0 refills | Status: DC
Start: 2022-01-28 — End: 2022-02-27

## 2022-01-28 NOTE — Telephone Encounter (Signed)
Breaker says he took his last Concerta pill today and would like a refill.

## 2022-02-16 DIAGNOSIS — M47816 Spondylosis without myelopathy or radiculopathy, lumbar region: Secondary | ICD-10-CM | POA: Diagnosis not present

## 2022-02-27 ENCOUNTER — Telehealth: Payer: Self-pay | Admitting: Family Medicine

## 2022-02-27 MED ORDER — METHYLPHENIDATE HCL ER (OSM) 36 MG PO TBCR: 36.0000 mg | EXTENDED_RELEASE_TABLET | Freq: Every day | ORAL | 0 refills | Status: DC

## 2022-02-27 NOTE — Telephone Encounter (Signed)
Pt called for refills of concerta. Please send to Karin Golden at [place]. Pt can be reached at [phone].

## 2022-03-17 ENCOUNTER — Other Ambulatory Visit: Payer: Self-pay | Admitting: Adult Health

## 2022-04-02 ENCOUNTER — Other Ambulatory Visit: Payer: Self-pay | Admitting: Family Medicine

## 2022-04-02 MED ORDER — METHYLPHENIDATE HCL ER (OSM) 36 MG PO TBCR: 36.0000 mg | EXTENDED_RELEASE_TABLET | Freq: Every day | ORAL | 0 refills | Status: DC

## 2022-04-03 ENCOUNTER — Ambulatory Visit: Payer: BC Managed Care – PPO | Admitting: Family Medicine

## 2022-04-03 VITALS — BP 118/70 | HR 91 | Temp 98.1°F | Wt 190.8 lb

## 2022-04-03 DIAGNOSIS — R292 Abnormal reflex: Secondary | ICD-10-CM | POA: Diagnosis not present

## 2022-04-03 DIAGNOSIS — G479 Sleep disorder, unspecified: Secondary | ICD-10-CM | POA: Diagnosis not present

## 2022-04-03 DIAGNOSIS — R5383 Other fatigue: Secondary | ICD-10-CM | POA: Diagnosis not present

## 2022-04-03 DIAGNOSIS — G4700 Insomnia, unspecified: Secondary | ICD-10-CM

## 2022-04-03 NOTE — Progress Notes (Signed)
   Subjective:    Patient ID: Parker Silva, male    DOB: February 03, 1956, 66 y.o.   MRN: [id number]  HPI He complains of a 43-month history of difficulty with memory, nocturia, decreased energy, insomnia, difficulty with his legs with RLS, headaches, weakness stating he sometimes drops things.  Occasional dizziness, decreased libido as well as erectile dysfunction.  No fever, chills, cough, congestion.  He has been under some stress recently because 2 of his friends apparently died.   Review of Systems     Objective:   Physical Exam Alert and in no distress. Tympanic membranes and canals are normal. Pharyngeal area is normal. Neck is supple without adenopathy or thyromegaly. Cardiac exam shows a regular sinus rhythm without murmurs or gallops. Lungs are clear to auscultation. DTRs are diminished.  Skin appears normal.       Assessment & Plan:  Fatigue, unspecified type - Plan: CBC with Differential/Platelet, Comprehensive metabolic panel, Testosterone, TSH, Sedimentation rate  Reflexes abnormal - Plan: CBC with Differential/Platelet, Comprehensive metabolic panel, Testosterone, TSH, Sedimentation rate  Insomnia, unspecified type  Sleep disturbance He has a plethora of symptoms is difficult to get a good handle on so I will check general blood work including TSH and testosterone.  Discussed following up with Dr. Vickey Huger concerning his RLS as well as memory issues.

## 2022-04-04 LAB — CBC WITH DIFFERENTIAL/PLATELET
Basophils Absolute: 0.1 10*3/uL (ref 0.0–0.2)
Basos: 1 %
EOS (ABSOLUTE): 0.1 10*3/uL (ref 0.0–0.4)
Eos: 1 %
Hematocrit: 47.1 % (ref 37.5–51.0)
Hemoglobin: 16.2 g/dL (ref 13.0–17.7)
Immature Grans (Abs): 0 10*3/uL (ref 0.0–0.1)
Immature Granulocytes: 1 %
Lymphocytes Absolute: 2.3 10*3/uL (ref 0.7–3.1)
Lymphs: 28 %
MCH: 29.7 pg (ref 26.6–33.0)
MCHC: 34.4 g/dL (ref 31.5–35.7)
MCV: 86 fL (ref 79–97)
Monocytes Absolute: 0.9 10*3/uL (ref 0.1–0.9)
Monocytes: 10 %
Neutrophils Absolute: 5 10*3/uL (ref 1.4–7.0)
Neutrophils: 59 %
Platelets: 268 10*3/uL (ref 150–450)
RBC: 5.45 x10E6/uL (ref 4.14–5.80)
RDW: 11.7 % (ref 11.6–15.4)
WBC: 8.4 10*3/uL (ref 3.4–10.8)

## 2022-04-04 LAB — COMPREHENSIVE METABOLIC PANEL
ALT: 21 IU/L (ref 0–44)
AST: 17 IU/L (ref 0–40)
Albumin/Globulin Ratio: 2.5 — ABNORMAL HIGH (ref 1.2–2.2)
Albumin: 5 g/dL — ABNORMAL HIGH (ref 3.9–4.9)
Alkaline Phosphatase: 55 IU/L (ref 44–121)
BUN/Creatinine Ratio: 15 (ref 10–24)
BUN: 15 mg/dL (ref 8–27)
Bilirubin Total: 0.6 mg/dL (ref 0.0–1.2)
CO2: 23 mmol/L (ref 20–29)
Calcium: 9.9 mg/dL (ref 8.6–10.2)
Chloride: 101 mmol/L (ref 96–106)
Creatinine, Ser: 0.99 mg/dL (ref 0.76–1.27)
Globulin, Total: 2 g/dL (ref 1.5–4.5)
Glucose: 109 mg/dL — ABNORMAL HIGH (ref 70–99)
Potassium: 4.3 mmol/L (ref 3.5–5.2)
Sodium: 140 mmol/L (ref 134–144)
Total Protein: 7 g/dL (ref 6.0–8.5)
eGFR: 85 mL/min/{1.73_m2} (ref >59)

## 2022-04-04 LAB — TSH: TSH: 1.93 u[IU]/mL (ref 0.450–4.500)

## 2022-04-04 LAB — SEDIMENTATION RATE: Sed Rate: 2 mm/hr (ref 0–30)

## 2022-04-04 LAB — TESTOSTERONE: Testosterone: 482 ng/dL (ref 264–916)

## 2022-04-29 ENCOUNTER — Encounter: Payer: Self-pay | Admitting: Internal Medicine

## 2022-04-29 DIAGNOSIS — M47816 Spondylosis without myelopathy or radiculopathy, lumbar region: Secondary | ICD-10-CM | POA: Diagnosis not present

## 2022-05-05 ENCOUNTER — Telehealth: Payer: Self-pay

## 2022-05-05 MED ORDER — METHYLPHENIDATE HCL ER (OSM) 36 MG PO TBCR: 36.0000 mg | EXTENDED_RELEASE_TABLET | Freq: Every day | ORAL | 0 refills | Status: DC

## 2022-05-05 NOTE — Telephone Encounter (Signed)
Pt. Called LM stating he needs a refill on Concerta to [facility] last apt 04/03/22.

## 2022-06-02 ENCOUNTER — Encounter: Payer: Self-pay | Admitting: Internal Medicine

## 2022-06-07 ENCOUNTER — Other Ambulatory Visit: Payer: Self-pay | Admitting: Family Medicine

## 2022-06-07 MED ORDER — METHYLPHENIDATE HCL ER (OSM) 36 MG PO TBCR: 36.0000 mg | EXTENDED_RELEASE_TABLET | Freq: Every day | ORAL | 0 refills | Status: DC

## 2022-06-11 ENCOUNTER — Other Ambulatory Visit: Payer: Self-pay | Admitting: Family Medicine

## 2022-06-11 DIAGNOSIS — F341 Dysthymic disorder: Secondary | ICD-10-CM

## 2022-06-11 NOTE — Telephone Encounter (Signed)
Is this okay to refill? 

## 2022-06-15 ENCOUNTER — Encounter: Payer: Self-pay | Admitting: Internal Medicine

## 2022-07-09 ENCOUNTER — Telehealth: Payer: Self-pay | Admitting: Family Medicine

## 2022-07-09 ENCOUNTER — Other Ambulatory Visit: Payer: Self-pay | Admitting: Medical

## 2022-07-09 MED ORDER — METHYLPHENIDATE HCL ER (OSM) 36 MG PO TBCR: 36.0000 mg | EXTENDED_RELEASE_TABLET | Freq: Every day | ORAL | 0 refills | Status: DC

## 2022-07-09 NOTE — Telephone Encounter (Signed)
Parker Silva pt asking for a refill on concerta to pharmacy [facility] [phone] - [place], [place] - [address]

## 2022-07-09 NOTE — Telephone Encounter (Signed)
Pt requesting a nurse give him a call today please

## 2022-07-10 ENCOUNTER — Other Ambulatory Visit: Payer: Self-pay | Admitting: Medical

## 2022-07-10 ENCOUNTER — Telehealth: Payer: Self-pay

## 2022-07-10 MED ORDER — METHYLPHENIDATE HCL ER (OSM) 36 MG PO TBCR: 36.0000 mg | EXTENDED_RELEASE_TABLET | Freq: Every day | ORAL | 0 refills | Status: DC

## 2022-07-10 NOTE — Telephone Encounter (Signed)
Pt. Went to [facility] to pick up his Concerta and they were out of it there. He called around yesterday and [facility] [address] and some if you could change it to that pharmacy. He said if you could please do it asap incase they are out please do not wait until the end of the day.

## 2022-08-10 ENCOUNTER — Telehealth: Payer: Self-pay | Admitting: Internal Medicine

## 2022-08-10 MED ORDER — METHYLPHENIDATE HCL ER (OSM) 36 MG PO TBCR: 36.0000 mg | EXTENDED_RELEASE_TABLET | Freq: Every day | ORAL | 0 refills | Status: DC

## 2022-08-10 NOTE — Telephone Encounter (Signed)
Pt needs a refill on Concerta to [facility] on [address].

## 2022-09-03 ENCOUNTER — Other Ambulatory Visit: Payer: Self-pay | Admitting: Adult Health

## 2022-09-04 ENCOUNTER — Telehealth: Payer: Self-pay | Admitting: Family Medicine

## 2022-09-04 ENCOUNTER — Other Ambulatory Visit: Payer: Self-pay | Admitting: Family Medicine

## 2022-09-04 MED ORDER — METHYLPHENIDATE HCL ER (OSM) 36 MG PO TBCR: 36.0000 mg | EXTENDED_RELEASE_TABLET | Freq: Every day | ORAL | 0 refills | Status: DC

## 2022-09-04 MED ORDER — METHYLPHENIDATE HCL ER (OSM) 36 MG PO TBCR
36.0000 mg | EXTENDED_RELEASE_TABLET | Freq: Every day | ORAL | 0 refills | Status: DC
Start: 2022-09-04 — End: 2022-10-01

## 2022-09-04 NOTE — Telephone Encounter (Signed)
Pt is going out of town the 08UP-10RP and his Concerta is due for refill on the 18th. Are you able to fill it before he leaves to go out of town? He is going to [place].

## 2022-09-13 ENCOUNTER — Telehealth: Payer: Self-pay | Admitting: Family Medicine

## 2022-09-13 NOTE — Telephone Encounter (Signed)
P.A. CONCERTA  °

## 2022-09-14 ENCOUNTER — Other Ambulatory Visit: Payer: Self-pay | Admitting: Adult Health

## 2022-09-14 DIAGNOSIS — N528 Other male erectile dysfunction: Secondary | ICD-10-CM | POA: Diagnosis not present

## 2022-09-14 DIAGNOSIS — R6882 Decreased libido: Secondary | ICD-10-CM | POA: Diagnosis not present

## 2022-09-14 DIAGNOSIS — N486 Induration penis plastica: Secondary | ICD-10-CM | POA: Diagnosis not present

## 2022-09-14 DIAGNOSIS — N401 Enlarged prostate with lower urinary tract symptoms: Secondary | ICD-10-CM | POA: Diagnosis not present

## 2022-09-14 DIAGNOSIS — R3915 Urgency of urination: Secondary | ICD-10-CM | POA: Diagnosis not present

## 2022-09-17 ENCOUNTER — Telehealth: Payer: BC Managed Care – PPO | Admitting: Family Medicine

## 2022-09-17 ENCOUNTER — Encounter: Payer: Self-pay | Admitting: Family Medicine

## 2022-09-17 DIAGNOSIS — J0110 Acute frontal sinusitis, unspecified: Secondary | ICD-10-CM | POA: Diagnosis not present

## 2022-09-17 MED ORDER — AMOXICILLIN-POT CLAVULANATE 875-125 MG PO TABS: 1.0000 | ORAL_TABLET | Freq: Two times a day (BID) | ORAL | 0 refills | Status: DC

## 2022-09-17 NOTE — Progress Notes (Signed)
   Subjective:    Patient ID: Parker Silva, male    DOB: 04-12-1956, 67 y.o.   MRN: [id number]  HPI Documentation for virtual audio and video telecommunications through [facility] encounter: The patient was located at home. 2 patient identifiers used.  The provider was located in the office. The patient did consent to this visit and is aware of possible charges through their insurance for this visit. The other persons participating in this telemedicine service were none. Time spent on call was 5 minutes and in review of previous records >15  minutes total for counseling and coordination of care. This virtual service is not related to other E/M service within previous 7 days.  He states that on Sunday he started having difficulty with sinus congestion, headache, coughing, fatigue to the point that he was unable to work.  He has been using a Nettie pot with some success.  Review of Systems     Objective:   Physical Exam Alert and in no distress with nasal sounding voice.       Assessment & Plan:  Acute non-recurrent frontal sinusitis - Plan: amoxicillin-clavulanate (AUGMENTIN) 875-125 MG tablet He is to call me at the end of 10 days if he is not totally back to normal.

## 2022-09-19 NOTE — Telephone Encounter (Signed)
P.A.denied for brand, but generic is covered

## 2022-09-23 NOTE — Telephone Encounter (Signed)
Called pt and he states generic is ok

## 2022-10-01 ENCOUNTER — Telehealth: Payer: Self-pay | Admitting: Family Medicine

## 2022-10-01 DIAGNOSIS — J0110 Acute frontal sinusitis, unspecified: Secondary | ICD-10-CM

## 2022-10-01 MED ORDER — AMOXICILLIN-POT CLAVULANATE 875-125 MG PO TABS: 1.0000 | ORAL_TABLET | Freq: Two times a day (BID) | ORAL | 0 refills | Status: DC

## 2022-10-01 MED ORDER — METHYLPHENIDATE HCL ER (OSM) 36 MG PO TBCR: 36.0000 mg | EXTENDED_RELEASE_TABLET | Freq: Every day | ORAL | 0 refills | Status: DC

## 2022-10-01 NOTE — Telephone Encounter (Signed)
Parker Silva called and says he is still having congestion and needs another round of antibiotics sent to Parker Silva, Parker Silva he wants to let you know in advance that his refill for concerta is coming up on 1/16 and he would like that sent to Parker Silva, Parker Silva

## 2022-10-08 DIAGNOSIS — H52203 Unspecified astigmatism, bilateral: Secondary | ICD-10-CM | POA: Diagnosis not present

## 2022-10-08 DIAGNOSIS — H5203 Hypermetropia, bilateral: Secondary | ICD-10-CM | POA: Diagnosis not present

## 2022-10-08 DIAGNOSIS — H2513 Age-related nuclear cataract, bilateral: Secondary | ICD-10-CM | POA: Diagnosis not present

## 2022-10-12 ENCOUNTER — Other Ambulatory Visit: Payer: Self-pay | Admitting: Medical

## 2022-10-12 ENCOUNTER — Telehealth: Payer: Self-pay | Admitting: Family Medicine

## 2022-10-12 MED ORDER — METHYLPHENIDATE HCL ER (OSM) 36 MG PO TBCR: 36.0000 mg | EXTENDED_RELEASE_TABLET | Freq: Every day | ORAL | 0 refills | Status: DC

## 2022-10-12 NOTE — Telephone Encounter (Signed)
Pt called Parker Silva is out of Concerta.  [facility] [address] [facility].  [name] will move rx of Concerta 36 mg 1qd to [facility] [address].  Dr Redmond School on vac.

## 2022-11-09 DIAGNOSIS — N401 Enlarged prostate with lower urinary tract symptoms: Secondary | ICD-10-CM | POA: Diagnosis not present

## 2022-11-09 DIAGNOSIS — R3915 Urgency of urination: Secondary | ICD-10-CM | POA: Diagnosis not present

## 2022-11-10 ENCOUNTER — Telehealth: Payer: Self-pay | Admitting: Family Medicine

## 2022-11-10 MED ORDER — METHYLPHENIDATE HCL ER (OSM) 36 MG PO TBCR: 36.0000 mg | EXTENDED_RELEASE_TABLET | Freq: Every day | ORAL | 0 refills | Status: DC

## 2022-11-10 NOTE — Telephone Encounter (Signed)
[id number] [place] [place]

## 2022-11-16 ENCOUNTER — Ambulatory Visit: Payer: BC Managed Care – PPO | Admitting: Adult Health

## 2022-11-24 ENCOUNTER — Other Ambulatory Visit: Payer: Self-pay | Admitting: Adult Health

## 2022-11-24 NOTE — Telephone Encounter (Signed)
Requested Prescriptions   Pending Prescriptions Disp Refills   gabapentin (NEURONTIN) 100 MG capsule [Pharmacy Med Name: gabapentin 100 mg capsule] 270 capsule 3    Sig: Take 3 capsules (300 mg total) by mouth at bedtime.   Last seen on 11/13/21 by Ward Givens. No upcoming appt made. Pt will need to schedule an appt to obtain a refill  Dispenses   Dispensed Days Supply Quantity Provider Pharmacy  gabapentin 100 mg capsule 09/16/2022 88 213 each Ward Givens, NP [place]...  gabapentin 100 mg capsule 08/26/2022 19 73 each Ward Givens, NP [facility]...  gabapentin 100 mg capsule 06/01/2022 90 270 each Ward Givens, NP [place]...  gabapentin 100 mg capsule 03/02/2022 90 270 each Ward Givens, NP [place]...  gabapentin 100 mg capsule 12/11/2021 90 270 each Ward Givens, NP [facility] KitchenMarland Kitchen

## 2022-11-24 NOTE — Telephone Encounter (Signed)
Pt called wanting to know when this medication will be called in for him. Please advise.

## 2022-11-25 MED ORDER — GABAPENTIN 100 MG PO CAPS: 300.0000 mg | ORAL_CAPSULE | Freq: Every day | ORAL | 1 refills | Status: DC

## 2022-11-25 NOTE — Addendum Note (Signed)
Addended by: Brandon Melnick on: 11/25/2022 09:31 AM   Modules accepted: Orders

## 2022-11-25 NOTE — Telephone Encounter (Signed)
Refilled. Pt has appt 05/2023

## 2022-11-25 NOTE — Telephone Encounter (Signed)
Pt has scheduled his 1 yr f/u with wait list, he is asking if now his refill can be called in

## 2022-12-03 ENCOUNTER — Other Ambulatory Visit: Payer: Self-pay | Admitting: Adult Health

## 2022-12-10 ENCOUNTER — Other Ambulatory Visit: Payer: Self-pay | Admitting: *Deleted

## 2022-12-13 ENCOUNTER — Other Ambulatory Visit: Payer: Self-pay | Admitting: Family Medicine

## 2022-12-13 MED ORDER — METHYLPHENIDATE HCL ER (OSM) 36 MG PO TBCR: 36.0000 mg | EXTENDED_RELEASE_TABLET | Freq: Every day | ORAL | 0 refills | Status: DC

## 2022-12-29 ENCOUNTER — Ambulatory Visit: Payer: BC Managed Care – PPO | Admitting: Adult Health

## 2022-12-29 VITALS — BP 131/84 | HR 78 | Ht 72.0 in | Wt 193.8 lb

## 2022-12-29 DIAGNOSIS — G2581 Restless legs syndrome: Secondary | ICD-10-CM | POA: Diagnosis not present

## 2022-12-29 DIAGNOSIS — G4762 Sleep related leg cramps: Secondary | ICD-10-CM | POA: Diagnosis not present

## 2022-12-29 MED ORDER — ROPINIROLE HCL 0.5 MG PO TABS: 0.5000 mg | ORAL_TABLET | Freq: Every day | ORAL | 1 refills | Status: DC

## 2022-12-29 NOTE — Progress Notes (Signed)
PATIENT: Parker Silva DOB: 1956/02/13  REASON FOR VISIT: follow up HISTORY FROM: patient  Chief Complaint  Patient presents with   Follow-up    Rm 8, alone.  RLS.     HISTORY OF PRESENT ILLNESS:  Today 12/29/22:  Parker Silva is a 67 y.o. male with a history of Low back pain and RLS. Returns today for follow-up.  He reports that he tends to wake up between 1 and 3 AM with restless leg symptoms.  Continues to take Requip, trazodone gabapentin and Flexeril before bedtime.  He reports that he did not try reducing gabapentin after the last visit.  He returns today for an evaluation.   History 11/13/21: Parker Silva is a 67 year old male with a history of low back pain and restless leg syndrome.  He returns today for follow-up.  He feels that his symptoms have remained fairly stable.  Reports that he used to wake up nightly at 3 AM but now he wakes up around 5 AM.  He remains on Flexeril 10 mg at bedtime, gabapentin 300 mg at bedtime, Requip 0.5 mg at bedtime and trazodone 150 mg at bedtime.  Reports that sometimes he does feel little sluggish in the mornings.  He denies any new symptoms.  He returns today for an evaluation.  11/13/20: Parker Silva is a 67 year old male with a history of low back pain and restless leg syndrome.  He returns today for follow-up.  He is currently on Flexeril 10 mg at bedtime, gabapentin 300 mg at bedtime, Requip 0.5 mg at bedtime and trazodone 150 mg at bedtime.  He reports that this continues to work well for him.  Reports that he had neck surgery last year with Dr. Yetta Barre reports that he has recovered well.  Returns today for follow-up.  11/16/19: Parker Silva is a 67 year old male with a history of low back pain and restless leg syndrome.  He returns today for follow-up.  He continues to take Requip, trazodone, Flexeril and gabapentin before bedtime.  He states that this typically works fairly well for him.  He may wake up at 3 or 4 AM but typically can get  back to sleep.  He states recently he has been waking up and will get a muscle cramp in the right calf.  He states that he has been working outdoors more.  He knows his water intake has decreased.  He does take magnesium supplement.  Returns today for an evaluation.  HISTORY 03/28/19:   Parker Silva is a 67 year old male with a history of low back pain and restless leg syndrome.  He returns today for follow-up.  He states that he has been experimenting with his medication.  He states that he is currently taking 1 tablet of Requip, 1 tablet of trazodone, 1 tablet of Flexeril and 1 tablet of gabapentin before bedtime.  He states that this combination has helped the most with his restless leg.  He states that he is able to get to sleep and stay asleep.  He states that he continues to notice hunger issues and feels that he has had weight gain.  However according to our records he is only gained 3 pounds since his visit in the office in October 2019.  He returns today for an evaluation.  REVIEW OF SYSTEMS: Out of a complete 14 system review of symptoms, the patient complains only of the following symptoms, and all other reviewed systems are negative.  See HPI  ALLERGIES: No Known  Allergies  HOME MEDICATIONS: Outpatient Medications Prior to Visit  Medication Sig Dispense Refill   amoxicillin-clavulanate (AUGMENTIN) 875-125 MG tablet Take 1 tablet by mouth 2 (two) times daily. 20 tablet 0   Biotin w/ Vitamins C & E (HAIR/SKIN/NAILS PO) Take 2,500 mg by mouth daily.     buPROPion (WELLBUTRIN SR) 150 MG 12 hr tablet TAKE 1 TABLET ONCE DAILY. 90 tablet 3   cyclobenzaprine (FLEXERIL) 10 MG tablet Take 1 tablet (10 mg total) by mouth at bedtime. 90 tablet 1   ferrous sulfate 325 (65 FE) MG tablet Take 325 mg by mouth daily with breakfast. (Patient not taking: Reported on 09/17/2022)     flurbiprofen (ANSAID) 100 MG tablet TAKE 1 TABLET TWICE DAILY AS NEEDED FOR HEADACHE. NO MORE THAN 4 DAYS/WEEK. (Patient not  taking: Reported on 09/17/2022) 40 tablet 0   gabapentin (NEURONTIN) 100 MG capsule Take 3 capsules (300 mg total) by mouth at bedtime. 270 capsule 1   Magnesium 500 MG TABS Take 500 mg by mouth daily.      methylphenidate (CONCERTA) 36 MG PO CR tablet Take 1 tablet (36 mg total) by mouth daily. 30 tablet 0   Multiple Vitamins-Minerals (CENTRUM SILVER ULTRA MENS PO) Take by mouth.     Probiotic Product (PROBIOTIC DAILY PO) Take 1 tablet by mouth daily. GX (Patient not taking: Reported on 09/17/2022)     rizatriptan (MAXALT) 10 MG tablet Take 1 tablet (10 mg total) by mouth as needed (not had in 1 year as of 01-23-2015). May repeat in 2 hours if needed (Patient not taking: Reported on 04/03/2022) 10 tablet 1   rOPINIRole (REQUIP) 0.5 MG tablet Take 1 tablet (0.5 mg total) by mouth at bedtime. 90 tablet 1   sildenafil (REVATIO) 20 MG tablet Take up to 5 at one time as needed for erectile dysfunction 30 tablet 0   traMADol (ULTRAM) 50 MG tablet Take 50 mg by mouth every 6 (six) hours as needed for moderate pain. (Patient not taking: Reported on 04/03/2022)     traZODone (DESYREL) 150 MG tablet TAKE 1 & 1/2 TABLETS AT BEDTIME AS NEEDED FOR SLEEP. 135 tablet 1   No facility-administered medications prior to visit.    PAST MEDICAL HISTORY: Past Medical History:  Diagnosis Date   ADHD (attention deficit hyperactivity disorder)    Allergy    RHINITIS-seasonal   Anal fissure    Arthritis    back,neck   Dyslexia    ED (erectile dysfunction)    Hemorrhoids    Hemorrhoids, internal, with bleeding 02/13/2015   Grade 2 hemorrhoids 02/13/15 band ligation right posterior hemorrhoidal bundle 03/04/15 band ligation right anterior hemorrhoidal bundle 04/15/15 band ligation left lateral hemorrhoidal bundle    History of migraine headaches    Sleep disturbance    Tension headache     PAST SURGICAL HISTORY: Past Surgical History:  Procedure Laterality Date   APPENDECTOMY     COLONOSCOPY  2006   HEMORRHOID  BANDING     POSTERIOR CERVICAL FUSION/FORAMINOTOMY N/A 04/08/2020   Procedure: POSTERIOR CERVICAL FUSION/FORAMINOTOMY CERVICAL TWO- CERVICAL SEVEN, LAMINECTOMY CERVICAL TWO- CERVICAL FIVE.;  Surgeon: Tia Alert, MD;  Location: [facility] OR;  Service: Neurosurgery;  Laterality: N/A;  posterior   TONSILLECTOMY      FAMILY HISTORY: Family History  Problem Relation Age of Onset   Breast cancer Mother    Diabetes type II Father    Heart failure Father    COPD Father    Colon cancer Neg Hx  Rectal cancer Neg Hx    Stomach cancer Neg Hx     SOCIAL HISTORY: Social History   Socioeconomic History   Marital status: Single    Spouse name: Not on file   Number of children: 0   Years of education: Not on file   Highest education level: Not on file  Occupational History   Occupation: owns Visual merchandiser business    Employer: [employer]  Tobacco Use   Smoking status: Never   Smokeless tobacco: Never  Vaping Use   Vaping Use: Never used  Substance and Sexual Activity   Alcohol use: Yes    Comment: 3 times a year   Drug use: No   Sexual activity: Not Currently  Other Topics Concern   Not on file  Social History Narrative   Lives alone in a one story home in [place].   Right handed   Caffeine: none    Social Determinants of Health   Financial Resource Strain: Not on file  Food Insecurity: Not on file  Transportation Needs: Not on file  Physical Activity: Not on file  Stress: Not on file  Social Connections: Not on file  Intimate Partner Violence: Not on file      PHYSICAL EXAM  Vitals:   12/29/22 0832  BP: 131/84  Pulse: 78  Weight: 193 lb 12.8 oz (87.9 kg)  Height: 6' (1.829 m)     Body mass index is 26.28 kg/m.  Generalized: Well developed, in no acute distress   Neurological examination  Mentation: Alert oriented to time, place, history taking. Follows all commands speech and language fluent Cranial nerve II-XII: Pupils were equal round  reactive to light. Extraocular movements were full, visual field were full on confrontational test.  Head turning and shoulder shrug  were normal and symmetric. Motor: The motor testing reveals 5 over 5 strength of all 4 extremities. Good symmetric motor tone is noted throughout.  Sensory: Sensory testing is intact to soft touch on all 4 extremities. No evidence of extinction is noted.  Coordination: Cerebellar testing reveals good finger-nose-finger and heel-to-shin bilaterally.  Gait and station: Gait is normal.     DIAGNOSTIC DATA (LABS, IMAGING, TESTING) - I reviewed patient records, labs, notes, testing and imaging myself where available.  Lab Results  Component Value Date   WBC 8.4 04/03/2022   HGB 16.2 04/03/2022   HCT 47.1 04/03/2022   MCV 86 04/03/2022   PLT 268 04/03/2022      Component Value Date/Time   NA 140 04/03/2022 1615   K 4.3 04/03/2022 1615   CL 101 04/03/2022 1615   CO2 23 04/03/2022 1615   GLUCOSE 109 (H) 04/03/2022 1615   GLUCOSE 82 11/13/2016 1346   BUN 15 04/03/2022 1615   CREATININE 0.99 04/03/2022 1615   CREATININE 1.26 11/13/2016 1346   CALCIUM 9.9 04/03/2022 1615   PROT 7.0 04/03/2022 1615   ALBUMIN 5.0 (H) 04/03/2022 1615   AST 17 04/03/2022 1615   ALT 21 04/03/2022 1615   ALKPHOS 55 04/03/2022 1615   BILITOT 0.6 04/03/2022 1615   GFRNONAA 79 08/22/2020 1424   GFRAA 92 08/22/2020 1424    Lab Results  Component Value Date   TSH 1.930 04/03/2022      ASSESSMENT AND PLAN 67 y.o. year old male  has a past medical history of ADHD (attention deficit hyperactivity disorder), Allergy, Anal fissure, Arthritis, Dyslexia, ED (erectile dysfunction), Hemorrhoids, Hemorrhoids, internal, with bleeding (02/13/2015), History of migraine headaches, Sleep disturbance, and  Tension headache. here with :  1. Restless leg syndrome  -Continue Requip 0.5 mg at bedtime. Ok to take 1/2 tablet in the middle of night for breakthrough symptoms -Continue trazodone  150 mg- 1.5 tablets nightly -Try to reduce gabapentin to  200 mg at bedtime -Continue Flexeril 10 mg at bedtime  Advised to follow-up in 1 year or sooner if needed   Butch Penny, MSN, NP-C 12/29/2022, 8:21 AM [facility] [address] [place], [place] [address] [phone]

## 2022-12-29 NOTE — Patient Instructions (Signed)
Your Plan:  -Continue Requip 0.5 mg at bedtime- ok to take 1/2 tablet in the middle of the night for break through symptoms -Continue trazodone 150 mg- 1.5 tablets nightly -Try to reduce gabapentin to 200 mg at bedtime -Continue Flexeril 10 mg at bedtime   Thank you for coming to see [place] at [facility]. I hope we have been able to provide you high quality care today.  You may receive a patient satisfaction survey over the next few weeks. We would appreciate your feedback and comments so that we may continue to improve ourselves and the health of our patients.

## 2023-01-11 ENCOUNTER — Telehealth: Payer: Self-pay | Admitting: Family Medicine

## 2023-01-11 ENCOUNTER — Other Ambulatory Visit: Payer: Self-pay | Admitting: Medical

## 2023-01-11 MED ORDER — METHYLPHENIDATE HCL ER (OSM) 36 MG PO TBCR: 36.0000 mg | EXTENDED_RELEASE_TABLET | Freq: Every day | ORAL | 0 refills | Status: DC

## 2023-01-11 NOTE — Telephone Encounter (Signed)
Pt [facility] a [id number] [place], [place] - [address]

## 2023-01-11 NOTE — Telephone Encounter (Signed)
Patient has not been seen for ADHD since 07/2020. Pt will need an appointment since Dr. Susann Givens is out of the country. Please schedule

## 2023-01-12 ENCOUNTER — Telehealth: Payer: Self-pay | Admitting: Family Medicine

## 2023-01-12 ENCOUNTER — Other Ambulatory Visit: Payer: Self-pay | Admitting: Medical

## 2023-01-12 MED ORDER — METHYLPHENIDATE HCL ER (OSM) 36 MG PO TBCR: 36.0000 mg | EXTENDED_RELEASE_TABLET | Freq: Every day | ORAL | 0 refills | Status: DC

## 2023-01-12 NOTE — Telephone Encounter (Signed)
Lafonda Mosses has handled this already

## 2023-01-12 NOTE — Telephone Encounter (Signed)
PT CALLED and the concerta was sent to the wrong pharmacy it should have been sent to the  [id number] [place], [place] - [address]

## 2023-01-12 NOTE — Telephone Encounter (Signed)
Pt called very frustrated.  He said he called 5 or 6 times yesterday trying to get his Concerta refilled.  We sent to [facility] and they do not have any.  He had requested we [facility] [address] as [facility] the med.  Called [place] and cancelled so it will not hit his insurance.  [facility] [address].

## 2023-01-12 NOTE — Telephone Encounter (Signed)
[facility].  Advised pt of same.

## 2023-01-15 ENCOUNTER — Ambulatory Visit (INDEPENDENT_AMBULATORY_CARE_PROVIDER_SITE_OTHER): Payer: BC Managed Care – PPO | Admitting: Medical

## 2023-01-15 VITALS — BP 120/70 | HR 82 | Wt 190.6 lb

## 2023-01-15 DIAGNOSIS — F909 Attention-deficit hyperactivity disorder, unspecified type: Secondary | ICD-10-CM

## 2023-01-15 DIAGNOSIS — G4701 Insomnia due to medical condition: Secondary | ICD-10-CM

## 2023-01-15 DIAGNOSIS — S20211A Contusion of right front wall of thorax, initial encounter: Secondary | ICD-10-CM

## 2023-01-15 DIAGNOSIS — F341 Dysthymic disorder: Secondary | ICD-10-CM | POA: Diagnosis not present

## 2023-01-15 DIAGNOSIS — G2581 Restless legs syndrome: Secondary | ICD-10-CM

## 2023-01-15 DIAGNOSIS — Z125 Encounter for screening for malignant neoplasm of prostate: Secondary | ICD-10-CM

## 2023-01-15 NOTE — Progress Notes (Signed)
Subjective:  Parker Silva is a 67 y.o. male who presents for Chief Complaint  Patient presents with   Medical Management of Chronic Issues    Refill on concerta. Has a bruise on right side rib by falling in garden     Medical team: Patient Care Team: Ronnald Nian, MD as PCP - General (Family Medicine) Butch Penny, NP as Registered Nurse (Neurology) Pa, [facility]   Here for medication review, medication management.  He was asked to come in as he has not been here in a while specifically regarding ADHD medication.  He normally sees Dr. Susann Givens who is out of the office short term.  ADHD He continues on Concerta 36 mg daily.  Does fine on this dose to help with focus and attention.  Wears off about 5pm.   [facility].     He has a history of dysthymia-he continues on Wellbutrin SR 150 mg daily  He has insomnia issues-uses trazodone 150 mg, 1-1/2 tablets at bedtime daily and uses gabapentin at nighttime as well. Sees neurology in this regard.  Recently neurology asked him to decrease gabapentin to 200 mg at bedtime.  Of note he uses gabapentin and Flexeril both for pain and sometimes to help with sleep.  This is prescribed by neurology  history of RLS-sees neurology, on Requip 0.5 mg daily at bedtime  He notes falling in the garden 2 weeks, fell on side.  He still has some pain.  No cough, no difficulty breathing.  No hemoptysis.  He was not sure how long it would take for the pain can be resolved  He has questions about prostate cancer screening  No other aggravating or relieving factors.    No other c/o.  Past Medical History:  Diagnosis Date   ADHD (attention deficit hyperactivity disorder)    Allergy    RHINITIS-seasonal   Anal fissure    Arthritis    back,neck   Dyslexia    ED (erectile dysfunction)    Hemorrhoids    Hemorrhoids, internal, with bleeding 02/13/2015   Grade 2 hemorrhoids 02/13/15 band ligation right  posterior hemorrhoidal bundle 03/04/15 band ligation right anterior hemorrhoidal bundle 04/15/15 band ligation left lateral hemorrhoidal bundle    History of migraine headaches    Sleep disturbance    Tension headache    Current Outpatient Medications on File Prior to Visit  Medication Sig Dispense Refill   buPROPion (WELLBUTRIN SR) 150 MG 12 hr tablet TAKE 1 TABLET ONCE DAILY. 90 tablet 3   cyclobenzaprine (FLEXERIL) 10 MG tablet Take 1 tablet (10 mg total) by mouth at bedtime. 90 tablet 1   gabapentin (NEURONTIN) 100 MG capsule Take 3 capsules (300 mg total) by mouth at bedtime. 270 capsule 1   Magnesium 500 MG TABS Take 500 mg by mouth daily.      methylphenidate (CONCERTA) 36 MG PO CR tablet Take 1 tablet (36 mg total) by mouth daily. 30 tablet 0   Multiple Vitamins-Minerals (CENTRUM SILVER ULTRA MENS PO) Take by mouth.     rOPINIRole (REQUIP) 0.5 MG tablet Take 1 tablet (0.5 mg total) by mouth at bedtime. Ok to take 1/2 tablet in the middle of night for breakthrough symptoms 135 tablet 1   sildenafil (REVATIO) 20 MG tablet Take up to 5 at one time as needed for erectile dysfunction 30 tablet 0   traZODone (DESYREL) 150 MG tablet TAKE 1 & 1/2 TABLETS AT BEDTIME AS NEEDED FOR SLEEP. 135  tablet 1   No current facility-administered medications on file prior to visit.    The following portions of the patient's history were reviewed and updated as appropriate: allergies, current medications, past family history, past medical history, past social history, past surgical history and problem list.  ROS Otherwise as in subjective above    Objective: BP 120/70   Pulse 82   Wt 190 lb 9.6 oz (86.5 kg)   BMI 25.85 kg/m   General appearance: alert, no distress, well developed, well nourished Tender over the right lateral chest wall from about the ninth rib down to the 12th rib region but no flail chest, no abnormal lung motion, inspiration and expiration within normal limits  lungs: CTA  bilaterally, no wheezes, rhonchi, or rales osych: Good eye contact, answers question appropriately      01/15/2023    8:47 AM 12/02/2021   11:46 AM 03/04/2016    9:18 AM  Depression screen PHQ 2/9  Decreased Interest 0 0 0  Down, Depressed, Hopeless 0 0 0  PHQ - 2 Score 0 0 0  Altered sleeping 3 3   Tired, decreased energy 1 0   Change in appetite 3 0   Feeling bad or failure about yourself  0 0   Trouble concentrating 0 1   Moving slowly or fidgety/restless 0 1   Suicidal thoughts 0 0   PHQ-9 Score 7 5   Difficult doing work/chores Not difficult at all Not difficult at all      Assessment: Encounter Diagnoses  Name Primary?   Attention deficit hyperactivity disorder (ADHD), unspecified ADHD type Yes   Dysthymia    Insomnia due to medical condition    RLS (restless legs syndrome)    Screening for prostate cancer    Rib contusion, right, initial encounter      Plan: ADHD Doing ok on current therapy.  Continue Concerta 36 mg daily.  We discussed proper use of medication, we discussed that this medicine requires routine follow-up and surveillance.  We discussed that it is a controlled substance and requires safekeeping.  He was frustrated that he called 5 or 6 times this week to get a refill.  Looking back he called about 6 times in a 2-day period between Monday and Tuesday.  I advised that he needs to give [place] at least 24 hours to send in the medication before he calls back the same day.  Also discussed that what delayed things this week is that his PCP is out of town, we have numerous refill request not only for our own patients but Dr. Jola Babinski patient's as well which delayed things, and given the fact that it was hard to find documentation about ADD follow-up in the last 12 months, we realized that he needed appointment as well.  I recommended in the future that he give this a few days advance notice when he needs a refill.  I recommend that he make sure he comes in at least once  a year if not twice here for ADD follow-up specifically, preferably with his PCP and not another provider.  I reminded him that numerous times in the decade or so that I have worked here that I have refilled his medication on his behalf when Dr. Susann Givens has been out of town.  I advised that we try our best to get to refill requests within 24 hours.  Dysthymia He continues on Wellbutrin 150 mg SR daily and feels like this works okay.  Insomnia -managed  by neurology, currently on trazodone 150 mg, 1.5 tablet at bedtime, gabapentin recently lowered to 200 mg at bedtime per neurology.  I reviewed his recent neurology consult notes from 12/29/2022  RLS -currently on Requip 0.5 mg at bedtime  Rib contusion-reassured that it would take a few weeks for the pain to settle down.  No obvious sign as well chest or fracture.  No frank bruising or discoloration on the surface.  Discussed incentive spirometry deep breathing exercises, can continue Tylenol or alternate with ibuprofen for pain as needed.  He had questions about prostate cancer screening.  We discussed screening recommendations.  Baseline PSA today.  He did take a sildenafil this morning of note.   Savian was seen today for medical management of chronic issues.  Diagnoses and all orders for this visit:  Attention deficit hyperactivity disorder (ADHD), unspecified ADHD type  Dysthymia  Insomnia due to medical condition  RLS (restless legs syndrome)  Screening for prostate cancer -     PSA  Rib contusion, right, initial encounter    Follow up:  with Dr. Susann Givens in the next few months for fasting well visit.

## 2023-01-16 LAB — PSA: Prostate Specific Ag, Serum: 0.2 ng/mL (ref 0.0–4.0)

## 2023-01-19 NOTE — Progress Notes (Signed)
Results sent through MyChart

## 2023-01-28 DIAGNOSIS — K080 Exfoliation of teeth due to systemic causes: Secondary | ICD-10-CM | POA: Diagnosis not present

## 2023-02-08 ENCOUNTER — Telehealth: Payer: Self-pay | Admitting: Family Medicine

## 2023-02-08 NOTE — Telephone Encounter (Signed)
Also pt called back and said he is down to 6 pills, so refill is needed.

## 2023-02-08 NOTE — Telephone Encounter (Signed)
Pt left message and said he goes on Medicare in one month and didn't know if you needed a heads up for his Concerta refills.

## 2023-02-09 ENCOUNTER — Other Ambulatory Visit: Payer: Self-pay | Admitting: Family Medicine

## 2023-02-09 ENCOUNTER — Telehealth: Payer: Self-pay | Admitting: Family Medicine

## 2023-02-09 MED ORDER — METHYLPHENIDATE HCL ER (OSM) 36 MG PO TBCR: 36.0000 mg | EXTENDED_RELEASE_TABLET | Freq: Every day | ORAL | 0 refills | Status: DC

## 2023-02-09 NOTE — Telephone Encounter (Signed)
[facility] [address]  That's the only place that has it.  I called and cancel [facility] t/w Toniann Fail.

## 2023-02-10 ENCOUNTER — Other Ambulatory Visit: Payer: Self-pay | Admitting: Family Medicine

## 2023-02-10 MED ORDER — METHYLPHENIDATE HCL ER (OSM) 36 MG PO TBCR: 36.0000 mg | EXTENDED_RELEASE_TABLET | Freq: Every day | ORAL | 0 refills | Status: DC

## 2023-02-10 NOTE — Telephone Encounter (Signed)
Medication refilled, will not know if needs P.A. until pharmacy processes with new insurance

## 2023-02-18 ENCOUNTER — Encounter: Payer: Self-pay | Admitting: Family Medicine

## 2023-02-18 ENCOUNTER — Ambulatory Visit (INDEPENDENT_AMBULATORY_CARE_PROVIDER_SITE_OTHER): Payer: Medicare Other | Admitting: Family Medicine

## 2023-02-18 VITALS — BP 128/86 | HR 96 | Temp 98.2°F | Ht 72.0 in | Wt 193.4 lb

## 2023-02-18 DIAGNOSIS — F341 Dysthymic disorder: Secondary | ICD-10-CM

## 2023-02-18 DIAGNOSIS — R635 Abnormal weight gain: Secondary | ICD-10-CM

## 2023-02-18 LAB — CBC WITH DIFFERENTIAL/PLATELET
Basophils Absolute: 0 10*3/uL (ref 0.0–0.2)
Basos: 1 %
Hemoglobin: 15.4 g/dL (ref 13.0–17.7)
Immature Grans (Abs): 0 10*3/uL (ref 0.0–0.1)
Immature Granulocytes: 0 %
MCHC: 34.1 g/dL (ref 31.5–35.7)
MCV: 87 fL (ref 79–97)

## 2023-02-18 LAB — LIPID PANEL

## 2023-02-18 LAB — COMPREHENSIVE METABOLIC PANEL

## 2023-02-18 LAB — TSH

## 2023-02-18 NOTE — Progress Notes (Signed)
   Subjective:    Patient ID: Parker Silva, male    DOB: 10-Feb-1956, 67 y.o.   MRN: [id number]  HPI He is here for consult concerning weight gain.  He states that normally in the winter he can gain weight in the summer he loses it.  He has had difficulty with that over the last several years and has been slowly gaining weight.  He keeps himself very physically active.  States his diet is quite healthy.  He sees neurology regularly for his OSA.  He also has had some episodes of tripping recently but states that neurology was not overly worried about that.  Continues on Concerta. He also continues on Wellbutrin and has been stable on that for a long period of time.  Review of Systems     Objective:    Physical Exam Alert and in no distress. Tympanic membranes and canals are normal. Pharyngeal area is normal. Neck is supple without adenopathy or thyromegaly. Cardiac exam shows a regular sinus rhythm without murmurs or gallops. Lungs are clear to auscultation.        Assessment & Plan:   Problem List Items Addressed This Visit   None Visit Diagnoses     Weight gain    -  Primary   Relevant Orders   CBC with Differential/Platelet   Comprehensive metabolic panel   Lipid panel   TSH     His BMI is quite good and I explained that he will probably not qualify to go to medical weight loss and certainly does not qualify to be on a weight loss medications.  He is now on Medicare which also interferes with that.  Discussed cutting back on carbohydrates specifically white food like bread, rice, pasta, potatoes and sugar recommend he cut these ingredients in half.

## 2023-02-19 LAB — CBC WITH DIFFERENTIAL/PLATELET
EOS (ABSOLUTE): 0.1 10*3/uL (ref 0.0–0.4)
Eos: 1 %
Hematocrit: 45.1 % (ref 37.5–51.0)
Lymphocytes Absolute: 2.5 10*3/uL (ref 0.7–3.1)
Lymphs: 35 %
MCH: 29.7 pg (ref 26.6–33.0)
Monocytes Absolute: 0.7 10*3/uL (ref 0.1–0.9)
Monocytes: 11 %
Neutrophils Absolute: 3.7 10*3/uL (ref 1.4–7.0)
Neutrophils: 52 %
Platelets: 242 10*3/uL (ref 150–450)
RBC: 5.19 x10E6/uL (ref 4.14–5.80)
RDW: 11.8 % (ref 11.6–15.4)
WBC: 7.1 10*3/uL (ref 3.4–10.8)

## 2023-02-19 LAB — COMPREHENSIVE METABOLIC PANEL
ALT: 23 IU/L (ref 0–44)
AST: 21 IU/L (ref 0–40)
Alkaline Phosphatase: 61 IU/L (ref 44–121)
BUN: 13 mg/dL (ref 8–27)
Bilirubin Total: 0.5 mg/dL (ref 0.0–1.2)
Calcium: 9.6 mg/dL (ref 8.6–10.2)
Chloride: 101 mmol/L (ref 96–106)
Globulin, Total: 1.8 g/dL (ref 1.5–4.5)
Glucose: 96 mg/dL (ref 70–99)
Potassium: 4.2 mmol/L (ref 3.5–5.2)
Total Protein: 6.3 g/dL (ref 6.0–8.5)

## 2023-02-19 LAB — LIPID PANEL
Cholesterol, Total: 165 mg/dL (ref 100–199)
HDL: 30 mg/dL — ABNORMAL LOW (ref >39)
Triglycerides: 341 mg/dL — ABNORMAL HIGH (ref 0–149)

## 2023-03-03 ENCOUNTER — Other Ambulatory Visit: Payer: Self-pay | Admitting: Adult Health

## 2023-03-04 ENCOUNTER — Other Ambulatory Visit: Payer: Self-pay | Admitting: Adult Health

## 2023-03-09 ENCOUNTER — Other Ambulatory Visit: Payer: Self-pay | Admitting: Adult Health

## 2023-03-11 ENCOUNTER — Telehealth: Payer: Self-pay | Admitting: Adult Health

## 2023-03-11 NOTE — Telephone Encounter (Signed)
Refills sent

## 2023-03-11 NOTE — Telephone Encounter (Signed)
Wendy @ [facility] states today is day 6 that they have been trying to get traZODone (DESYREL) 150 MG tablet filled for pt. Please reply

## 2023-03-15 ENCOUNTER — Telehealth: Payer: Self-pay | Admitting: Family Medicine

## 2023-03-15 MED ORDER — METHYLPHENIDATE HCL ER (OSM) 36 MG PO TBCR: 36.0000 mg | EXTENDED_RELEASE_TABLET | Freq: Every day | ORAL | 0 refills | Status: DC

## 2023-03-15 NOTE — Telephone Encounter (Signed)
Pt requesting concerta to be [facility]  [id number] [place], [place] - [address]

## 2023-03-22 DIAGNOSIS — K080 Exfoliation of teeth due to systemic causes: Secondary | ICD-10-CM | POA: Diagnosis not present

## 2023-04-13 ENCOUNTER — Telehealth: Payer: Self-pay | Admitting: Family Medicine

## 2023-04-13 MED ORDER — METHYLPHENIDATE HCL ER (OSM) 36 MG PO TBCR: 36.0000 mg | EXTENDED_RELEASE_TABLET | Freq: Every day | ORAL | 0 refills | Status: DC

## 2023-04-13 NOTE — Telephone Encounter (Signed)
Elery needs a refill on concerta to [id number] - [place], [place] - [address]

## 2023-04-15 ENCOUNTER — Telehealth: Payer: Self-pay | Admitting: Family Medicine

## 2023-04-15 NOTE — Telephone Encounter (Signed)
Pt called in about his concerta again, he needs a refill and says he is now out. If refill is denied he would like a call as to why. [id number] [place], [place] - [address]

## 2023-05-14 ENCOUNTER — Telehealth: Payer: Self-pay | Admitting: Family Medicine

## 2023-05-14 ENCOUNTER — Other Ambulatory Visit: Payer: Self-pay | Admitting: Nurse Practitioner

## 2023-05-14 DIAGNOSIS — F909 Attention-deficit hyperactivity disorder, unspecified type: Secondary | ICD-10-CM

## 2023-05-14 MED ORDER — METHYLPHENIDATE HCL ER (OSM) 36 MG PO TBCR: 36.0000 mg | EXTENDED_RELEASE_TABLET | Freq: Every day | ORAL | 0 refills | Status: DC

## 2023-05-14 NOTE — Telephone Encounter (Signed)
Pt is needing a refill on concerta, he has one pill left and wants to make sure he has it for the weekend. [facility]  [id number] [place],  - [address]

## 2023-05-17 ENCOUNTER — Telehealth: Payer: Self-pay | Admitting: Family Medicine

## 2023-05-17 NOTE — Telephone Encounter (Signed)
error 

## 2023-05-31 ENCOUNTER — Other Ambulatory Visit: Payer: Self-pay | Admitting: Family Medicine

## 2023-05-31 DIAGNOSIS — F341 Dysthymic disorder: Secondary | ICD-10-CM

## 2023-06-05 ENCOUNTER — Emergency Department (HOSPITAL_BASED_OUTPATIENT_CLINIC_OR_DEPARTMENT_OTHER)
Admission: EM | Admit: 2023-06-05 | Discharge: 2023-06-05 | Disposition: A | Discharge status: Home / Self Care | Payer: Medicare Other | Attending: Emergency Medicine | Admitting: Emergency Medicine

## 2023-06-05 ENCOUNTER — Other Ambulatory Visit: Payer: Self-pay

## 2023-06-05 ENCOUNTER — Encounter (HOSPITAL_BASED_OUTPATIENT_CLINIC_OR_DEPARTMENT_OTHER): Payer: Self-pay | Admitting: Emergency Medicine

## 2023-06-05 DIAGNOSIS — L0291 Cutaneous abscess, unspecified: Secondary | ICD-10-CM

## 2023-06-05 DIAGNOSIS — L02415 Cutaneous abscess of right lower limb: Secondary | ICD-10-CM | POA: Insufficient documentation

## 2023-06-05 DIAGNOSIS — M7989 Other specified soft tissue disorders: Secondary | ICD-10-CM | POA: Diagnosis not present

## 2023-06-05 MED ORDER — LIDOCAINE-EPINEPHRINE (PF) 2 %-1:200000 IJ SOLN
10.0000 mL | Freq: Once | INTRAMUSCULAR | Status: AC
  Administered 2023-06-05: 10 mL
  Filled 2023-06-05: qty 20

## 2023-06-05 MED ORDER — HYDROCODONE-ACETAMINOPHEN 5-325 MG PO TABS
1.0000 | ORAL_TABLET | Freq: Four times a day (QID) | ORAL | 0 refills | Status: DC | PRN
Start: 2023-06-05 — End: 2023-11-08

## 2023-06-05 MED ORDER — DOXYCYCLINE HYCLATE 100 MG PO CAPS
100.0000 mg | ORAL_CAPSULE | Freq: Two times a day (BID) | ORAL | 0 refills | Status: DC
Start: 2023-06-05 — End: 2023-11-08

## 2023-06-05 MED ORDER — OXYCODONE-ACETAMINOPHEN 5-325 MG PO TABS
1.0000 | ORAL_TABLET | Freq: Once | ORAL | Status: AC
  Administered 2023-06-05: 1 via ORAL
  Filled 2023-06-05: qty 1

## 2023-06-05 NOTE — ED Provider Notes (Signed)
Parker Parker Provider Note   CSN: [id number] Arrival date & time: 06/05/23  1600     History  No chief complaint on file.   Parker Parker is a 67 y.o. male.  Patient with noncontributory past medical history presents today with complaints of abscess.  He states that he noticed a bump on the back of his right upper leg 2 days ago.  He tried to squeeze the area and a small amount of purulent fluid came out.  The next morning he woke up and the area was larger and more painful and red.  He notes that is persisting into today and does not seem to be improving on its own.  Presents with concern for same.  States he has had an abscess drained in the emergency department previously and this feels similar.  He denies any fevers or chills.  The history is provided by the patient. No language interpreter was used.       Home Medications Prior to Admission medications   Medication Sig Start Date End Date Taking? Authorizing Provider  buPROPion (WELLBUTRIN SR) 150 MG 12 hr tablet TAKE 1 TABLET ONCE DAILY. 06/01/23   Ronnald Nian, MD  cyclobenzaprine (FLEXERIL) 10 MG tablet Take 1 tablet (10 mg total) by mouth at bedtime. 03/04/23   Butch Penny, NP  gabapentin (NEURONTIN) 100 MG capsule Take 2 capsules (200 mg total) by mouth at bedtime. 03/11/23   Butch Penny, NP  Magnesium 500 MG TABS Take 500 mg by mouth daily.     [provider]  methylphenidate 36 MG PO CR tablet Take 1 tablet (36 mg total) by mouth daily. 05/14/23   Ronnald Nian, MD  Multiple Vitamins-Minerals (CENTRUM SILVER ULTRA MENS PO) Take by mouth.    [provider]  rOPINIRole (REQUIP) 0.5 MG tablet Take 1 tablet (0.5 mg total) by mouth at bedtime. Ok to take 1/2 tablet in the middle of night for breakthrough symptoms 12/29/22   Butch Penny, NP  sildenafil (REVATIO) 20 MG tablet Take up to 5 at one time as needed for erectile dysfunction 12/23/21   Ronnald Nian, MD  traZODone (DESYREL) 150 MG tablet TAKE 1 & 1/2 TABLETS AT BEDTIME AS NEEDED FOR SLEEP. 03/11/23   Butch Penny, NP      Allergies    Patient has no known allergies.    Review of Systems   Review of Systems  Skin:  Positive for wound.  All other systems reviewed and are negative.   Physical Exam Updated Vital Signs BP 132/82 (BP Location: Right Arm)   Pulse 96   Temp 98.5 F (36.9 C) (Oral)   Resp 16   SpO2 100%  Physical Exam Vitals and nursing note reviewed.  Constitutional:      General: He is not in acute distress.    Appearance: Normal appearance. He is normal weight. He is not ill-appearing, toxic-appearing or diaphoretic.  HENT:     Head: Normocephalic and atraumatic.  Cardiovascular:     Rate and Rhythm: Normal rate.  Pulmonary:     Effort: Pulmonary effort is normal. No respiratory distress.  Musculoskeletal:        General: Normal range of motion.     Cervical back: Normal range of motion.  Skin:    General: Skin is warm and dry.     Comments: 1 cm x 1 cm area of fluctuance with surrounding induration and erythema present to the posterior  right thigh area.  No active drainage.  The area is warm and tender.  Neurological:     General: No focal deficit present.     Mental Status: He is alert.  Psychiatric:        Mood and Affect: Mood normal.        Behavior: Behavior normal.     ED Results / Procedures / Treatments   Labs (all labs ordered are listed, but only abnormal results are displayed) Labs Reviewed - No data to display  EKG None  Radiology No results found.  Procedures Ultrasound ED Soft Tissue  Date/Time: 06/05/2023 7:48 PM  Performed by: Parker Bandy, PA-C Authorized by: Parker Bandy, PA-C   Procedure details:    Indications: localization of abscess     Transverse view:  Visualized   Longitudinal view:  Visualized   Images: archived   Location:    Location: lower extremity     Side:  Right Findings:      abscess present    cellulitis present .Marland KitchenIncision and Drainage  Date/Time: 06/05/2023 7:48 PM  Performed by: Parker Bandy, PA-C Authorized by: Parker Bandy, PA-C   Consent:    Consent obtained:  Verbal   Consent given by:  Patient   Risks discussed:  Bleeding, incomplete drainage, pain, damage to other organs and infection   Alternatives discussed:  No treatment, delayed treatment, alternative treatment, observation and referral Universal protocol:    Procedure explained and questions answered to patient or proxy's satisfaction: yes     Relevant documents present and verified: yes     Test results available : yes     Imaging studies available: yes     Required blood products, implants, devices, and special equipment available: yes     Patient identity confirmed:  Verbally with patient Location:    Type:  Abscess   Size:  1 cm x 1 cm   Location:  Lower extremity   Lower extremity location:  Leg   Leg location:  R upper leg Pre-procedure details:    Skin preparation:  Povidone-iodine Anesthesia:    Anesthesia method:  Local infiltration   Local anesthetic:  Lidocaine 2% WITH epi Procedure type:    Complexity:  Complex Procedure details:    Incision types:  Single straight   Incision depth:  Subcutaneous   Wound management:  Probed and deloculated, irrigated with saline and extensive cleaning   Drainage:  Purulent and bloody   Drainage amount:  Moderate   Wound treatment:  Wound left open   Packing materials:  None Post-procedure details:    Procedure completion:  Tolerated well, no immediate complications     Medications Ordered in ED Medications  lidocaine-EPINEPHrine (XYLOCAINE W/EPI) 2 %-1:200000 (PF) injection 10 mL (10 mLs Infiltration Given 06/05/23 1742)  oxyCODONE-acetaminophen (PERCOCET/ROXICET) 5-325 MG per tablet 1 tablet (1 tablet Oral Given 06/05/23 1741)    ED Course/ Medical Decision Making/ A&P                                 Medical Decision  Making Risk Prescription drug management.   Patient presents today with right upper thigh abscess x 2 days.  He is afebrile, nontoxic-appearing, and in no acute distress reassuring vital signs.  He is not diabetic.  Physical exam reveals 1 cm x 1 cm area of fluctuance with surrounding induration and erythema with warmth.  Area observed under bedside  ultrasound per above procedure which showed a clear abscess amenable to incision and drainage per above procedure which was well-tolerated. Abscess was not large enough to warrant packing or drain,  wound recheck in 2 days. Encouraged home warm soaks and flushing.  Mild signs of cellulitis is surrounding skin, therefore we will discharge home with doxycycline. Evaluation and diagnostic testing in the emergency department does not suggest an emergent condition requiring admission or immediate intervention beyond what has been performed at this time.  Plan for discharge with close PCP follow-up.  Patient is understanding and amenable with plan, educated on red flag symptoms that would prompt immediate return.  Patient discharged in stable condition.  Final Clinical Impression(s) / ED Diagnoses Final diagnoses:  Abscess    Rx / DC Orders ED Discharge Orders          Ordered    doxycycline (VIBRAMYCIN) 100 MG capsule  2 times daily        06/05/23 1935    HYDROcodone-acetaminophen (NORCO/VICODIN) 5-325 MG tablet  Every 6 hours PRN        06/05/23 1935          An After Visit Summary was printed and given to the patient.     Parker Bandy, PA-C 06/05/23 1951    Franne Forts, DO 06/08/23 1330

## 2023-06-05 NOTE — ED Triage Notes (Signed)
Right upper thigh bump not sure if cyst or insect bite. Noticed yesterday, pt tried to express it and got a small amount. Very painful. No fevers

## 2023-06-05 NOTE — Discharge Instructions (Addendum)
You were seen in the emergency department for an abscess.  We have drained the area and cleaned it. I would like you to have the wound checked in 2-3 days. This can be done by any doctor's office, urgent care, or emergency department. This is to make sure the area hasn't closed too soon. Try to keep the area as clean and dry as possible. It is okay to let warm soapy water run over the area and to shower normally  I am placing you on a course of antibiotics. It is important you finish the entire course! You can take ibuprofen or tylenol as needed for pain.   Additionally, have given you a prescription for a few doses of Vicodin which is a narcotic pain medication for you to take as prescribed as needed for severe pain only.  Do not drive or operate heavy machinery while taking this medication as it can be sedating.  You can use an antiseptic (chlorhexidine) soap from the pharmacy 1-2 x per month in the areas where abscesses are most likely to form (armpits, buttocks, groin). This soap can dry your skin out so use it sparingly. Once do so once this area has fully healed.   Continue to monitor how you're doing and return to the ER for new or worsening symptoms.

## 2023-06-14 ENCOUNTER — Telehealth: Payer: Self-pay | Admitting: Family Medicine

## 2023-06-14 DIAGNOSIS — F909 Attention-deficit hyperactivity disorder, unspecified type: Secondary | ICD-10-CM

## 2023-06-14 NOTE — Telephone Encounter (Signed)
Pt needs refill on methylphenidate 36 mg  [facility] [place]

## 2023-06-15 MED ORDER — METHYLPHENIDATE HCL ER (OSM) 36 MG PO TBCR
36.0000 mg | EXTENDED_RELEASE_TABLET | Freq: Every day | ORAL | 0 refills | Status: DC
Start: 2023-06-15 — End: 2023-07-19

## 2023-06-16 ENCOUNTER — Telehealth: Payer: Self-pay | Admitting: Adult Health

## 2023-06-16 NOTE — Telephone Encounter (Signed)
Pt called wanting to speak to the RN about his  cyclobenzaprine (FLEXERIL) 10 MG tablet . Pt states that he informed provider that he does not stay asleep with this medication and he was advise to take an extra half. Pt states that he is needing his RX changed and resent to [facility] so that he will have enough to cover the pill and a half. Please advise.

## 2023-06-16 NOTE — Telephone Encounter (Signed)
I spoke with the patient and we discussed his medications and the past office notes with medication instructions.  The only medication the patient can take an extra half tablet as needed is requip.  The patient said he will look at his medications again when he gets home as he may be mistaken.  He understands he should not take more than 10 mg of Flexeril at night due to risk for side effects such as sedation.  We discussed his trazodone, gabapentin, Requip, and flexeril instructions during the call.  If he needs more refills of Requip he will reach out to [facility].  If he has further instruction questions he will call our office. He thanked me for the call back.

## 2023-06-17 ENCOUNTER — Ambulatory Visit: Payer: BC Managed Care – PPO | Admitting: Adult Health

## 2023-07-19 ENCOUNTER — Telehealth: Payer: Self-pay | Admitting: Family Medicine

## 2023-07-19 DIAGNOSIS — F909 Attention-deficit hyperactivity disorder, unspecified type: Secondary | ICD-10-CM

## 2023-07-19 MED ORDER — METHYLPHENIDATE HCL ER (OSM) 36 MG PO TBCR
36.0000 mg | EXTENDED_RELEASE_TABLET | Freq: Every day | ORAL | 0 refills | Status: DC
Start: 2023-07-19 — End: 2023-08-13

## 2023-07-19 NOTE — Telephone Encounter (Signed)
Pt requesting a refill on concerta to  [id number] - [place], [place] - [address]

## 2023-07-24 ENCOUNTER — Other Ambulatory Visit: Payer: Self-pay | Admitting: Adult Health

## 2023-07-26 ENCOUNTER — Other Ambulatory Visit: Payer: Self-pay | Admitting: Adult Health

## 2023-07-26 NOTE — Telephone Encounter (Signed)
Rx refilled per documentation.

## 2023-07-28 ENCOUNTER — Telehealth: Payer: Self-pay | Admitting: Adult Health

## 2023-07-28 NOTE — Telephone Encounter (Signed)
Parker Silva with [facility] pharmacy called stated they have been trying to reach out to [place] to get a refill on Gapentin and she has not received a response yet. She stated the best number to contact her is [phone].

## 2023-07-28 NOTE — Telephone Encounter (Signed)
I spoke with Toniann Fail @ the pharmacy. She was able to locate the refill and thanked [place] for the call back.

## 2023-07-28 NOTE — Telephone Encounter (Signed)
Ok we sent the refill two days ago. Will have to call them and see what happened.

## 2023-08-13 ENCOUNTER — Telehealth: Payer: Self-pay | Admitting: Family Medicine

## 2023-08-13 DIAGNOSIS — F909 Attention-deficit hyperactivity disorder, unspecified type: Secondary | ICD-10-CM

## 2023-08-13 MED ORDER — METHYLPHENIDATE HCL ER (OSM) 36 MG PO TBCR
36.0000 mg | EXTENDED_RELEASE_TABLET | Freq: Every day | ORAL | 0 refills | Status: DC
Start: 2023-08-17 — End: 2023-09-17

## 2023-08-13 NOTE — Telephone Encounter (Signed)
Needs refill methylphenidate 36 mg  ( refill date falls on Christmas)   [facility]

## 2023-08-29 ENCOUNTER — Other Ambulatory Visit: Payer: Self-pay | Admitting: Adult Health

## 2023-09-17 ENCOUNTER — Telehealth: Payer: Self-pay | Admitting: Family Medicine

## 2023-09-17 DIAGNOSIS — F909 Attention-deficit hyperactivity disorder, unspecified type: Secondary | ICD-10-CM

## 2023-09-17 MED ORDER — METHYLPHENIDATE HCL ER (OSM) 36 MG PO TBCR
36.0000 mg | EXTENDED_RELEASE_TABLET | Freq: Every day | ORAL | 0 refills | Status: DC
Start: 2023-09-17 — End: 2023-10-19

## 2023-09-17 NOTE — Telephone Encounter (Signed)
Pt is needing a refill on concerta sent to  [id number] - [place], [place] - [address]  He took his last one today.

## 2023-09-27 ENCOUNTER — Other Ambulatory Visit: Payer: Self-pay | Admitting: Adult Health

## 2023-09-28 ENCOUNTER — Telehealth: Payer: Self-pay | Admitting: *Deleted

## 2023-09-28 ENCOUNTER — Other Ambulatory Visit: Payer: Self-pay | Admitting: *Deleted

## 2023-09-28 MED ORDER — GABAPENTIN 100 MG PO CAPS: 300.0000 mg | ORAL_CAPSULE | Freq: Every day | ORAL | 0 refills | Status: DC

## 2023-09-28 NOTE — Progress Notes (Signed)
error 

## 2023-09-28 NOTE — Telephone Encounter (Signed)
[place] [facility] Batten) Mr. Mcpartlin came to the pharmacy requesting Gabapentin for 3 time daily instead of 2 times daily. Please send prescription appropriate or if not just let us know so we can tell the patient. Contact Information: [phone]

## 2023-09-28 NOTE — Telephone Encounter (Signed)
[facility] and let them know refill has been sent to there.

## 2023-09-28 NOTE — Telephone Encounter (Signed)
[facility]

## 2023-09-28 NOTE — Addendum Note (Signed)
Addended by: Bertram Savin on: 09/28/2023 02:37 PM   Modules accepted: Orders

## 2023-09-28 NOTE — Telephone Encounter (Signed)
 [facility] sent us  a fax requesting clarification/change of prescription to specify what patient says he is taking: Gabapentin  300 mg at bedtime (using 100 mg capsules). Per previous office note, Megan NP encouraged patient to try to reduce to 200 mg at bedtime.   I called the patient. He said he did try to reduce to 200 mg at bedtime but he found he was waking up in the middle of the night. I told him I would update Megan NP and give him a refill for the 300 mg at night and we can discuss further at his next office visit. He was appreciative.

## 2023-09-29 DIAGNOSIS — K080 Exfoliation of teeth due to systemic causes: Secondary | ICD-10-CM | POA: Diagnosis not present

## 2023-10-18 ENCOUNTER — Telehealth: Payer: Self-pay | Admitting: Family Medicine

## 2023-10-18 NOTE — Telephone Encounter (Signed)
 Medication Refill -  Most Recent Primary Care Visit:  Provider: Ronnald Nian  Department: [facility]  Visit Type: ACUTE 30  Date: 02/18/2023  Medication: Concerta  Has the patient contacted their pharmacy? Yes (Agent: If no, request that the patient contact the pharmacy for the refill. If patient does not wish to contact the pharmacy document the reason why and proceed with request.) (Agent: If yes, when and what did the pharmacy advise?)  Is this the correct pharmacy for this prescription? Yes If no, delete pharmacy and type the correct one.  This is the patient's preferred pharmacy:   [facility]  Has the prescription been filled recently? No  Is the patient out of the medication? No  Has the patient been seen for an appointment in the last year OR does the patient have an upcoming appointment? Yes  Can we respond through MyChart? No  Agent: Please be advised that Rx refills may take up to 3 business days. We ask that you follow-up with your pharmacy.

## 2023-10-18 NOTE — Telephone Encounter (Signed)
 Medication requested is not on patient's current medication list. Patient requesting refill on Concerta last ordered 04/23/2019. Requires PCP approval.

## 2023-10-18 NOTE — Telephone Encounter (Signed)
 Is this okay to refill?

## 2023-10-19 ENCOUNTER — Other Ambulatory Visit: Payer: Self-pay | Admitting: Medical

## 2023-10-19 ENCOUNTER — Encounter: Payer: Self-pay | Admitting: Internal Medicine

## 2023-10-19 DIAGNOSIS — F909 Attention-deficit hyperactivity disorder, unspecified type: Secondary | ICD-10-CM

## 2023-10-19 MED ORDER — METHYLPHENIDATE HCL ER (OSM) 36 MG PO TBCR
36.0000 mg | EXTENDED_RELEASE_TABLET | Freq: Every day | ORAL | 0 refills | Status: DC
Start: 2023-10-19 — End: 2023-11-17

## 2023-11-04 DIAGNOSIS — N401 Enlarged prostate with lower urinary tract symptoms: Secondary | ICD-10-CM | POA: Diagnosis not present

## 2023-11-04 DIAGNOSIS — N528 Other male erectile dysfunction: Secondary | ICD-10-CM | POA: Diagnosis not present

## 2023-11-04 DIAGNOSIS — N486 Induration penis plastica: Secondary | ICD-10-CM | POA: Diagnosis not present

## 2023-11-04 DIAGNOSIS — R3915 Urgency of urination: Secondary | ICD-10-CM | POA: Diagnosis not present

## 2023-11-08 ENCOUNTER — Ambulatory Visit
Admission: RE | Admit: 2023-11-08 | Discharge: 2023-11-08 | Disposition: A | Discharge status: Home / Self Care | Source: Ambulatory Visit | Attending: Medical | Admitting: Medical

## 2023-11-08 ENCOUNTER — Ambulatory Visit: Admitting: Medical

## 2023-11-08 VITALS — BP 120/80 | HR 96 | Wt 192.2 lb

## 2023-11-08 DIAGNOSIS — M25571 Pain in right ankle and joints of right foot: Secondary | ICD-10-CM

## 2023-11-08 DIAGNOSIS — S99911A Unspecified injury of right ankle, initial encounter: Secondary | ICD-10-CM

## 2023-11-08 MED ORDER — NAPROXEN 500 MG PO TABS: 500.0000 mg | ORAL_TABLET | Freq: Two times a day (BID) | ORAL | 0 refills | Status: DC

## 2023-11-08 MED ORDER — KETOROLAC TROMETHAMINE 60 MG/2ML IM SOLN
60.0000 mg | Freq: Once | INTRAMUSCULAR | Status: AC
Start: 2023-11-08 — End: 2023-11-08
  Administered 2023-11-08: 60 mg via INTRAMUSCULAR

## 2023-11-08 NOTE — Patient Instructions (Addendum)
 Please go to [facility] for your right ankle xray.   Their hours are 8am - 4:30 pm Monday - Friday.  Take your insurance card with you.  [facility] [phone]   [address] [place], [place] [address]    Recommendations: REST We gave you an injection of Toradol 60 mg for pain today Use cold therapy such as bucket of cold water, 20 minutes 3 times daily the next few days Go for xray Begin prescription Naprosyn 500 mg twice a day with food for the next 5 to 7 days for pain and inflammation If your x-ray shows NO sign of bony injury or fracture we will recommend an ASO type ankle brace with crutches, preferably to avoid too much weight bearing on the ankle while it heals Another option is a Cam walker boot that you would do daily for the next 2 weeks so you can weight bear It generally takes a few weeks for an ankle sprain to heal, sometimes takes 6 to 7 weeks We will call with x-ray result            [facility] [address] [place], [place] [address] [phone] phone

## 2023-11-08 NOTE — Progress Notes (Signed)
 Subjective:  Parker Silva is a 68 y.o. male who presents for Chief Complaint  Patient presents with   sprained right ankle    Sprained right ankle last Tuesday. Wrapped it 1st day and then elevated and applied Ice to it.      He is here for right ankle sprain.  Last Tuesday stepped in a hole, twisted ankle inverting.  Larey Seat, then was able to ambulate.  Had immediate pain.  No pop.   It did swell and bruise.  Been using ice, rest, elevation, ibuprofen.  Using Aleve initially a few times daily, but lately none.  Using no splint.  Feels about same as last.  No other aggravating or relieving factors.    No other c/o.  The following portions of the patient's history were reviewed and updated as appropriate: allergies, current medications, past family history, past medical history, past social history, past surgical history and problem list.  ROS Otherwise as in subjective above  Past Medical History:  Diagnosis Date   ADHD (attention deficit hyperactivity disorder)    Allergy    RHINITIS-seasonal   Anal fissure    Arthritis    back,neck   Dyslexia    ED (erectile dysfunction)    Hemorrhoids    Hemorrhoids, internal, with bleeding 02/13/2015   Grade 2 hemorrhoids 02/13/15 band ligation right posterior hemorrhoidal bundle 03/04/15 band ligation right anterior hemorrhoidal bundle 04/15/15 band ligation left lateral hemorrhoidal bundle    History of migraine headaches    Sleep disturbance    Tension headache    Current Outpatient Medications on File Prior to Visit  Medication Sig Dispense Refill   buPROPion (WELLBUTRIN SR) 150 MG 12 hr tablet TAKE 1 TABLET ONCE DAILY. 90 tablet 3   cyclobenzaprine (FLEXERIL) 10 MG tablet Take 1 tablet (10 mg total) by mouth at bedtime. 90 tablet 3   gabapentin (NEURONTIN) 100 MG capsule Take 3 capsules (300 mg total) by mouth at bedtime. 270 capsule 0   Magnesium 500 MG TABS Take 500 mg by mouth daily.      methylphenidate 36 MG PO CR tablet Take 1  tablet (36 mg total) by mouth daily. 30 tablet 0   Multiple Vitamins-Minerals (CENTRUM SILVER ULTRA MENS PO) Take by mouth.     rOPINIRole (REQUIP) 0.5 MG tablet Take 1 tablet (0.5 mg total) by mouth at bedtime. Ok to take 1/2 tablet in the middle of night for breakthrough symptoms 135 tablet 1   sildenafil (REVATIO) 20 MG tablet Take up to 5 at one time as needed for erectile dysfunction 30 tablet 0   traZODone (DESYREL) 150 MG tablet TAKE 1 & 1/2 TABLETS AT BEDTIME AS NEEDED FOR SLEEP. 135 tablet 1   No current facility-administered medications on file prior to visit.     Objective: BP 120/80   Pulse 96   Wt 192 lb 3.2 oz (87.2 kg)   BMI 26.07 kg/m   General appearance: alert, no distress, well developed, well nourished MSK: Right ankle with generalized swelling there is some purplish and yellow coloration bruising from the distal right leg to the midfoot, tender over the lateral malleolus, tender over the medial and lateral ligaments, range of motion relatively full but he does have pain with inversion and eversion, no laxity, no Achilles tenderness, no toe tenderness Foot neurovascular intact Rest of leg unremarkable     Assessment: Encounter Diagnoses  Name Primary?   Acute right ankle pain Yes   Injury of right ankle, initial encounter  Plan: Discussed symptoms, exam findings.   Discussed following.  Toradol 60mg  IM given in office.    Please go to [facility] for your right ankle xray.   Their hours are 8am - 4:30 pm Monday - Friday.  Take your insurance card with you.  [facility] [phone]   [address] [place], [place] [address]    Recommendations: REST We gave you an injection of Toradol 60 mg for pain today Use cold therapy such as bucket of cold water, 20 minutes 3 times daily the next few days Go for xray Begin prescription Naprosyn 500 mg twice a day with food for the next 5 to 7 days for pain and inflammation If your  x-ray shows NO sign of bony injury or fracture we will recommend an ASO type ankle brace with crutches, preferably to avoid too much weight bearing on the ankle while it heals Another option is a Cam walker boot that you would do daily for the next 2 weeks so you can weight bear It generally takes a few weeks for an ankle sprain to heal, sometimes takes 6 to 7 weeks We will call with x-ray result            [facility] [address], [place], [place] [address] [phone] phone   Parker Silva was seen today for sprained right ankle.  Diagnoses and all orders for this visit:  Acute right ankle pain -     DG Ankle Complete Right; Future  Injury of right ankle, initial encounter -     DG Ankle Complete Right; Future  Other orders -     naproxen (NAPROSYN) 500 MG tablet; Take 1 tablet (500 mg total) by mouth 2 (two) times daily with a meal.    Follow up: pending xray

## 2023-11-08 NOTE — Addendum Note (Signed)
 Addended by: Herminio Commons A on: 11/08/2023 04:43 PM   Modules accepted: Orders

## 2023-11-09 ENCOUNTER — Ambulatory Visit: Admitting: Nurse Practitioner

## 2023-11-09 NOTE — Progress Notes (Signed)
 The x-ray shows a nondisplaced fracture or break in the tip of the lateral malleolus which is on the outside of his ankle.  This will be best managed with rest and keeping the ankle from moving a whole lot  I would recommend using a Cam walker boot which allows weightbearing but also helps splint the foot.  He can do this in conjunction with an ankle brace such as the ASO type ankle brace recommended, not an ankle sleeve or ACE wrap.  It may be easiest to purchase this online through [employer] or at a local medical supply store.  If his insurance will pay for this, we can certainly send a prescription to wherever he is going to purchase these locally.  If he has trouble finding these we may end up having to just into orthopedic office.  Apparently the [facility] is no longer at that location.  This is news to me.     Certainly continue the rest of this week with cold therapy such as a bucket of cold water for 20 minutes at a time 2-3 times a day, the medications prescribed as well.  Plan to follow-up with Dr. Susann Givens in 2 weeks.  Dr. Susann Givens and I discussed, and we will want him to do some physical therapy once we have done 2 weeks in the boot assuming he is much improved at that time

## 2023-11-17 ENCOUNTER — Other Ambulatory Visit: Payer: Self-pay | Admitting: Family Medicine

## 2023-11-17 DIAGNOSIS — F909 Attention-deficit hyperactivity disorder, unspecified type: Secondary | ICD-10-CM

## 2023-11-17 MED ORDER — METHYLPHENIDATE HCL ER (OSM) 36 MG PO TBCR
36.0000 mg | EXTENDED_RELEASE_TABLET | Freq: Every day | ORAL | 0 refills | Status: DC
Start: 2023-11-17 — End: 2023-12-22

## 2023-12-21 ENCOUNTER — Other Ambulatory Visit: Payer: Self-pay | Admitting: Family Medicine

## 2023-12-21 DIAGNOSIS — F909 Attention-deficit hyperactivity disorder, unspecified type: Secondary | ICD-10-CM

## 2023-12-21 NOTE — Telephone Encounter (Signed)
 Copied from CRM [id number]. Topic: Clinical - Medication Refill >> Dec 21, 2023 10:32 AM Rosaria Common wrote: Most Recent Primary Care Visit:  Provider: Claudene Crystal  Department: [facility]  Visit Type: ACUTE  Date: 11/08/2023  Medication: methylphenidate  36 MG PO CR tablet  Has the patient contacted their pharmacy? Yes (Agent: If no, request that the patient contact the pharmacy for the refill. If patient does not wish to contact the pharmacy document the reason why and proceed with request.) (Agent: If yes, when and what did the pharmacy advise?)  Is this the correct pharmacy for this prescription? Yes If no, delete pharmacy and type the correct one.  This is the patient's preferred pharmacy[id number] [place] [place] - [address] [place] [address] Phone: [phone] Fax: [phone]     Has the prescription been filled recently? Yes  Is the patient out of the medication? Yes  Has the patient been seen for an appointment in the last year OR does the patient have an upcoming appointment? Yes  Can we respond through MyChart? Yes  Agent: Please be advised that Rx refills may take up to 3 business days. We ask that you follow-up with your pharmacy.

## 2023-12-22 ENCOUNTER — Telehealth: Payer: Self-pay

## 2023-12-22 ENCOUNTER — Other Ambulatory Visit: Payer: Self-pay | Admitting: Family Medicine

## 2023-12-22 DIAGNOSIS — F341 Dysthymic disorder: Secondary | ICD-10-CM

## 2023-12-22 DIAGNOSIS — F909 Attention-deficit hyperactivity disorder, unspecified type: Secondary | ICD-10-CM

## 2023-12-22 MED ORDER — BUPROPION HCL ER (SR) 150 MG PO TB12
150.0000 mg | ORAL_TABLET | Freq: Every day | ORAL | 3 refills | Status: DC
Start: 2023-12-22 — End: 2024-02-22

## 2023-12-22 MED ORDER — METHYLPHENIDATE HCL ER (OSM) 36 MG PO TBCR
36.0000 mg | EXTENDED_RELEASE_TABLET | Freq: Every day | ORAL | 0 refills | Status: DC
Start: 2023-12-22 — End: 2024-01-21

## 2023-12-22 NOTE — Telephone Encounter (Signed)
 Pt. Needs a refill on methylphenidate  last apt. 02/18/23

## 2023-12-29 ENCOUNTER — Ambulatory Visit: Payer: BC Managed Care – PPO | Admitting: Neurology

## 2023-12-29 ENCOUNTER — Encounter: Payer: Self-pay | Admitting: Neurology

## 2023-12-29 NOTE — Progress Notes (Deleted)
 Do not reschedule - patient has ne ot been seen in over 12 months, no need for visit- no active medication from [facility].

## 2023-12-30 ENCOUNTER — Telehealth: Payer: Self-pay | Admitting: Adult Health

## 2023-12-30 ENCOUNTER — Other Ambulatory Visit: Payer: Self-pay | Admitting: Adult Health

## 2023-12-30 ENCOUNTER — Telehealth: Payer: Self-pay | Admitting: Neurology

## 2023-12-30 MED ORDER — GABAPENTIN 100 MG PO CAPS: 300.0000 mg | ORAL_CAPSULE | Freq: Every day | ORAL | 0 refills | Status: DC

## 2023-12-30 NOTE — Addendum Note (Signed)
 Addended by: Burns Carwin on: 12/30/2023 04:36 PM   Modules accepted: Orders

## 2023-12-30 NOTE — Telephone Encounter (Signed)
 Refill sent to requested pharmacy.

## 2023-12-30 NOTE — Telephone Encounter (Signed)
 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 Transferred to phone room to r/s office visit, can a refill be put in for him at least for until his next appt?

## 2023-12-30 NOTE — Telephone Encounter (Signed)
 Pt called to r/s his missed appt. Pt is now scheduled on July 10th and is needing a refill on his gabapentin  (NEURONTIN ) 100 MG capsule sent in to the [facility]

## 2023-12-30 NOTE — Telephone Encounter (Signed)
 Sent gabapentin  to [facility] and [facility]. Received a receipt of confirmation.

## 2023-12-30 NOTE — Telephone Encounter (Signed)
 Patient called back in [facility] and [facility] needs it to be transferred to [facility]. This is his preferred pharmacy

## 2024-01-21 ENCOUNTER — Other Ambulatory Visit: Payer: Self-pay | Admitting: Family Medicine

## 2024-01-21 DIAGNOSIS — F909 Attention-deficit hyperactivity disorder, unspecified type: Secondary | ICD-10-CM

## 2024-01-21 MED ORDER — METHYLPHENIDATE HCL ER (OSM) 36 MG PO TBCR
36.0000 mg | EXTENDED_RELEASE_TABLET | Freq: Every day | ORAL | 0 refills | Status: DC
Start: 2024-01-21 — End: 2024-02-21

## 2024-01-21 NOTE — Telephone Encounter (Signed)
 Copied from CRM [id number]. Topic: Clinical - Medication Refill >> Jan 21, 2024  8:47 AM Everette C wrote: Medication: methylphenidate  36 MG PO CR tablet [[id number]]  Has the patient contacted their pharmacy? Yes (Agent: If no, request that the patient contact the pharmacy for the refill. If patient does not wish to contact the pharmacy document the reason why and proceed with request.) (Agent: If yes, when and what did the pharmacy advise?)  This is the patient's preferred pharmacy:  [id number] [place] [place] - [address] [place] [address] Phone: [phone] Fax: [phone]  Is this the correct pharmacy for this prescription? Yes If no, delete pharmacy and type the correct one.   Has the prescription been filled recently? Yes  Is the patient out of the medication? Yes  Has the patient been seen for an appointment in the last year OR does the patient have an upcoming appointment? Yes  Can we respond through MyChart? No  Agent: Please be advised that Rx refills may take up to 3 business days. We ask that you follow-up with your pharmacy.

## 2024-01-21 NOTE — Telephone Encounter (Signed)
 Next appt 01/27/24

## 2024-01-27 ENCOUNTER — Encounter: Admitting: Family Medicine

## 2024-02-15 ENCOUNTER — Ambulatory Visit

## 2024-02-15 VITALS — BP 122/70 | HR 88 | Temp 97.8°F | Ht 72.0 in | Wt 192.4 lb

## 2024-02-15 DIAGNOSIS — Z0000 Encounter for general adult medical examination without abnormal findings: Secondary | ICD-10-CM | POA: Diagnosis not present

## 2024-02-15 NOTE — Patient Instructions (Signed)
 Mr. Grammatico , Thank you for taking time out of your busy schedule to complete your Annual Wellness Visit with me. I enjoyed our conversation and look forward to speaking with you again next year. I, as well as your care team,  appreciate your ongoing commitment to your health goals. Please review the following plan we discussed and let me know if I can assist you in the future. Your Game plan/ To Do List    Referrals: If you haven't heard from the office you've been referred to, please reach out to them at the phone provided.  N/a Follow up Visits: Next Medicare AWV with our clinical staff: 02/20/2025 at 10:50   Have you seen your provider in the last 6 months (3 months if uncontrolled diabetes)? Yes Next Office Visit with your provider: 02/22/2024 at 11:45  Clinician Recommendations:  Aim for 30 minutes of exercise or brisk walking, 6-8 glasses of water, and 5 servings of fruits and vegetables each day.       This is a list of the screening recommended for you and due dates:  Health Maintenance  Topic Date Due   Pneumococcal Vaccine for age over 48 (1 of 1 - PCV) Never done   COVID-19 Vaccine (5 - 2024-25 season) 04/25/2023   DTaP/Tdap/Td vaccine (4 - Td or Tdap) 05/27/2023   Flu Shot  03/24/2024   Colon Cancer Screening  02/04/2025   Medicare Annual Wellness Visit  02/14/2025   Hepatitis C Screening  Completed   Zoster (Shingles) Vaccine  Completed   Hepatitis B Vaccine  Aged Out   HPV Vaccine  Aged Out   Meningitis B Vaccine  Aged Out    Advanced directives: (Copy Requested) Please bring a copy of your health care power of attorney and living will to the office to be added to your chart at your convenience. You can mail to [facility] [address] [place], [place] [address] or email to [email or web address] Advance Care Planning is important because it:  [x]  Makes sure you receive the medical care that is consistent with your values, goals, and  preferences  [x]  It provides guidance to your family and loved ones and reduces their decisional burden about whether or not they are making the right decisions based on your wishes.  Follow the link provided in your after visit summary or read over the paperwork we have mailed to you to help you started getting your Advance Directives in place. If you need assistance in completing these, please reach out to us  so that we can help you!  See attachments for Preventive Care and Fall Prevention Tips.

## 2024-02-15 NOTE — Progress Notes (Signed)
 Subjective:   Parker Silva is a 68 y.o. who presents for a Medicare Wellness preventive visit.  As a reminder, Annual Wellness Visits don't include a physical exam, and some assessments may be limited, especially if this visit is performed virtually. We may recommend an in-person follow-up visit with your provider if needed.  Visit Complete: In person    Persons Participating in Visit: Patient.  AWV Questionnaire: No: Patient Medicare AWV questionnaire was not completed prior to this visit.  Cardiac Risk Factors include: advanced age (>39men, >57 women);male gender     Objective:    Today's Vitals   02/15/24 0847  BP: 122/70  Pulse: 88  Temp: 97.8 F (36.6 C)  TempSrc: Oral  SpO2: 97%  Weight: 192 lb 6.4 oz (87.3 kg)  Height: 6' (1.829 m)   Body mass index is 26.09 kg/m.     02/15/2024    8:55 AM 06/05/2023    4:27 PM 04/08/2020   12:30 PM 04/02/2020   10:26 AM 01/23/2015    3:22 PM  Advanced Directives  Does Patient Have a Medical Advance Directive? Yes No Yes Yes Yes   Type of Estate agent of [place];Living will  Healthcare Power of [place];Living will Healthcare Power of [place];Living will Healthcare Power of [place];Living will   Does patient want to make changes to medical advance directive?   No - Patient declined No - Patient declined   Copy of Healthcare Power of Attorney in Chart? No - copy requested  No - copy requested No - copy requested   Would patient like information on creating a medical advance directive?  No - Patient declined        Data saved with a previous flowsheet row definition    Current Medications (verified) Outpatient Encounter Medications as of 02/15/2024  Medication Sig   buPROPion  ([name]) 150 MG 12 hr tablet Take 1 tablet (150 mg total) by mouth daily.   cyclobenzaprine  (FLEXERIL ) 10 MG tablet Take 1 tablet (10 mg total) by mouth at bedtime.   gabapentin  (NEURONTIN ) 100 MG capsule Take 3  capsules (300 mg total) by mouth at bedtime.   Magnesium 500 MG TABS Take 500 mg by mouth daily.    methylphenidate  36 MG PO CR tablet Take 1 tablet (36 mg total) by mouth daily.   Multiple Vitamins-Minerals (CENTRUM SILVER ULTRA MENS PO) Take by mouth.   rOPINIRole  (REQUIP ) 0.5 MG tablet Take 1 tablet (0.5 mg total) by mouth at bedtime. Ok to take 1/2 tablet in the middle of night for breakthrough symptoms   sildenafil  (REVATIO ) 20 MG tablet Take up to 5 at one time as needed for erectile dysfunction   traZODone  (DESYREL ) 150 MG tablet TAKE 1 & 1/2 TABLETS AT BEDTIME AS NEEDED FOR SLEEP.   naproxen  (NAPROSYN ) 500 MG tablet Take 1 tablet (500 mg total) by mouth 2 (two) times daily with a meal. (Patient not taking: Reported on 02/15/2024)   No facility-administered encounter medications on file as of 02/15/2024.    Allergies (verified) Patient has no known allergies.   History: Past Medical History:  Diagnosis Date   ADHD (attention deficit hyperactivity disorder)    Allergy    RHINITIS-seasonal   Anal fissure    Arthritis    back,neck   Dyslexia    ED (erectile dysfunction)    Hemorrhoids    Hemorrhoids, internal, with bleeding 02/13/2015   Grade 2 hemorrhoids 02/13/15 band ligation right posterior hemorrhoidal bundle 03/04/15 band ligation right  anterior hemorrhoidal bundle 04/15/15 band ligation left lateral hemorrhoidal bundle    History of migraine headaches    Sleep disturbance    Tension headache    Past Surgical History:  Procedure Laterality Date   APPENDECTOMY     COLONOSCOPY  2006   HEMORRHOID BANDING     POSTERIOR CERVICAL FUSION/FORAMINOTOMY N/A 04/08/2020   Procedure: POSTERIOR CERVICAL FUSION/FORAMINOTOMY CERVICAL TWO- CERVICAL SEVEN, LAMINECTOMY CERVICAL TWO- CERVICAL FIVE.;  Surgeon: Joshua Alm RAMAN, MD;  Location: [facility] OR;  Service: Neurosurgery;  Laterality: N/A;  posterior   TONSILLECTOMY     Family History  Problem Relation Age of Onset   Breast cancer Mother     Diabetes type II Father    Heart failure Father    COPD Father    Colon cancer Neg Hx    Rectal cancer Neg Hx    Stomach cancer Neg Hx    Social History   Socioeconomic History   Marital status: Single    Spouse name: Not on file   Number of children: 0   Years of education: Not on file   Highest education level: Not on file  Occupational History   Occupation: owns Visual merchandiser business    Employer: [employer]  Tobacco Use   Smoking status: Never   Smokeless tobacco: Never  Vaping Use   Vaping status: Never Used  Substance and Sexual Activity   Alcohol use: Not Currently    Comment: 3 times a year   Drug use: No   Sexual activity: Not Currently  Other Topics Concern   Not on file  Social History Narrative   Lives alone in a one story home in [place].   Right handed   Caffeine: none    Social Drivers of Corporate investment banker Strain: Low Risk  (02/15/2024)   Overall Financial Resource Strain (CARDIA)    Difficulty of Paying Living Expenses: Not hard at all  Food Insecurity: No Food Insecurity (02/15/2024)   Hunger Vital Sign    Worried About Running Out of Food in the Last Year: Never true    Ran Out of Food in the Last Year: Never true  Transportation Needs: No Transportation Needs (02/15/2024)   PRAPARE - Administrator, Civil Service (Medical): No    Lack of Transportation (Non-Medical): No  Physical Activity: Insufficiently Active (02/15/2024)   Exercise Vital Sign    Days of Exercise per Week: 3 days    Minutes of Exercise per Session: 20 min  Stress: Stress Concern Present (02/15/2024)   [employer] of Occupational Health - Occupational Stress Questionnaire    Feeling of Stress: To some extent  Social Connections: Unknown (02/15/2024)   Social Connection and Isolation Panel    Frequency of Communication with Friends and Family: Twice a week    Frequency of Social Gatherings with Friends and Family: Not on file    Attends  Religious Services: Never    Database administrator or Organizations: No    Attends Engineer, structural: Never    Marital Status: Divorced    Tobacco Counseling Counseling given: Not Answered    Clinical Intake:  Pre-visit preparation completed: Yes  Pain : No/denies pain     Nutritional Status: BMI 25 -29 Overweight Nutritional Risks: None Diabetes: No  No results found for: HGBA1C   How often do you need to have someone help you when you read instructions, pamphlets, or other written materials from your doctor or  pharmacy?: 1 - Never  Interpreter Needed?: No  Information entered by :: NAllen LPN   Activities of Daily Living     02/15/2024    8:48 AM  In your present state of health, do you have any difficulty performing the following activities:  Hearing? 0  Vision? 1  Comment a little fuzzy  Difficulty concentrating or making decisions? 0  Walking or climbing stairs? 0  Dressing or bathing? 0  Doing errands, shopping? 0  Preparing Food and eating ? N  Using the Toilet? N  In the past six months, have you accidently leaked urine? N  Do you have problems with loss of bowel control? N  Managing your Medications? N  Managing your Finances? N  Housekeeping or managing your Housekeeping? N    Patient Care Team: Joyce Norleen [name], MD as PCP - General (Family Medicine) Sherryl Bouchard, NP as Registered Nurse (Neurology) [facility], [facility]  I have updated your Care Teams any recent Medical Services you may have received from other providers in the past year.     Assessment:   This is a routine wellness examination for Parker Silva.  Hearing/Vision screen Hearing Screening - Comments:: Denies hearing issues Vision Screening - Comments:: Regular eye exams, [facility]   Goals Addressed             This Visit's Progress    Patient Stated       02/15/2024, wants to walk more       Depression Screen     02/15/2024    8:57  AM 01/15/2023    8:47 AM 12/02/2021   11:46 AM 03/04/2016    9:18 AM  PHQ 2/9 Scores  PHQ - 2 Score 1 0 0 0  PHQ- 9 Score 6 7 5      Fall Risk     02/15/2024    8:56 AM 01/15/2023    8:46 AM 09/17/2022    1:16 PM 05/11/2016    9:52 AM 03/04/2016    9:18 AM  Fall Risk   Falls in the past year? 1 1 0 No  No   Comment working in the yard      Number falls in past yr: 1 0 0    Injury with Fall? 0 1 0    Comment sprained ankle      Risk for fall due to : Medication side effect Impaired balance/gait No Fall Risks    Follow up Falls prevention discussed;Falls evaluation completed Falls evaluation completed Falls evaluation completed       Data saved with a previous flowsheet row definition    MEDICARE RISK AT HOME:  Medicare Risk at Home Any stairs in or around the home?: No If so, are there any without handrails?: No Home free of loose throw rugs in walkways, pet beds, electrical cords, etc?: Yes Adequate lighting in your home to reduce risk of falls?: Yes Life alert?: No Use of a cane, walker or w/c?: No Grab bars in the bathroom?: No Shower chair or bench in shower?: Yes Elevated toilet seat or a handicapped toilet?: Yes  TIMED UP AND GO:  Was the test performed?  Yes  Length of time to ambulate 10 feet: 5 sec Gait steady and fast without use of assistive device  Cognitive Function: 6CIT completed        02/15/2024    8:59 AM  6CIT Screen  What Year? 0 points  What month? 0 points  What time? 0 points  Count back from 20 2 points  Months in reverse 0 points  Repeat phrase 2 points  Total Score 4 points    Immunizations Immunization History  Administered Date(s) Administered   DTaP 12/22/2001   Influenza Whole 07/15/2009   Influenza,inj,Quad PF,6+ Mos 05/26/2013, 06/13/2019, 08/28/2020, 07/09/2021   Influenza-Unspecified 08/29/2019   PFIZER Comirnaty(Gray Top)Covid-19 Tri-Sucrose Vaccine 09/15/2019, 10/06/2019, 05/03/2020, 03/29/2021   Tdap 12/22/2001,  05/26/2013   Zoster Recombinant(Shingrix ) 06/13/2019, 08/28/2020    Screening Tests Health Maintenance  Topic Date Due   Pneumococcal Vaccine: 50+ Years (1 of 1 - PCV) Never done   COVID-19 Vaccine (5 - 2024-25 season) 04/25/2023   DTaP/Tdap/Td (4 - Td or Tdap) 05/27/2023   INFLUENZA VACCINE  03/24/2024   Colonoscopy  02/04/2025   Medicare Annual Wellness (AWV)  02/14/2025   Hepatitis C Screening  Completed   Zoster Vaccines- Shingrix   Completed   Hepatitis B Vaccines  Aged Out   HPV VACCINES  Aged Out   Meningococcal B Vaccine  Aged Out    Health Maintenance  Health Maintenance Due  Topic Date Due   Pneumococcal Vaccine: 50+ Years (1 of 1 - PCV) Never done   COVID-19 Vaccine (5 - 2024-25 season) 04/25/2023   DTaP/Tdap/Td (4 - Td or Tdap) 05/27/2023   Health Maintenance Items Addressed: Declines covid vaccine. Due for Pneumonia and TDAP.  Additional Screening:  Vision Screening: Recommended annual ophthalmology exams for early detection of glaucoma and other disorders of the eye. Would you like a referral to an eye doctor? No    Dental Screening: Recommended annual dental exams for proper oral hygiene  Community Resource Referral / Chronic Care Management: CRR required this visit?  No   CCM required this visit?  No   Plan:    I have personally reviewed and noted the following in the patient's chart:   Medical and social history Use of alcohol, tobacco or illicit drugs  Current medications and supplements including opioid prescriptions. Patient is not currently taking opioid prescriptions. Functional ability and status Nutritional status Physical activity Advanced directives List of other physicians Hospitalizations, surgeries, and ER visits in previous 12 months Vitals Screenings to include cognitive, depression, and falls Referrals and appointments  In addition, I have reviewed and discussed with patient certain preventive protocols, quality metrics, and  best practice recommendations. A written personalized care plan for preventive services as well as general preventive health recommendations were provided to patient.   Ardella FORBES Dawn, LPN   3/75/7974   After Visit Summary: (In Person-Printed) AVS printed and given to the patient  Notes: Nothing significant to report at this time.

## 2024-02-21 ENCOUNTER — Other Ambulatory Visit: Payer: Self-pay | Admitting: Family Medicine

## 2024-02-21 DIAGNOSIS — F909 Attention-deficit hyperactivity disorder, unspecified type: Secondary | ICD-10-CM

## 2024-02-21 MED ORDER — METHYLPHENIDATE HCL ER (OSM) 36 MG PO TBCR
36.0000 mg | EXTENDED_RELEASE_TABLET | Freq: Every day | ORAL | 0 refills | Status: DC
Start: 2024-02-21 — End: 2024-03-25

## 2024-02-21 NOTE — Telephone Encounter (Signed)
 Copied from CRM [id number]. Topic: Clinical - Medication Refill >> Feb 21, 2024 11:28 AM Wess RAMAN wrote: Medication: methylphenidate  36 MG PO CR tablet   Has the patient contacted their pharmacy? Yes (Agent: If no, request that the patient contact the pharmacy for the refill. If patient does not wish to contact the pharmacy document the reason why and proceed with request.) (Agent: If yes, when and what did the pharmacy advise?)  This is the patient's preferred [facility] [place], [place] - [address] [place] [address] Phone: [phone] Fax: [phone]   Is this the correct pharmacy for this prescription? Yes If no, delete pharmacy and type the correct one.   Has the prescription been filled recently? Yes  Is the patient out of the medication? Yes  Has the patient been seen for an appointment in the last year OR does the patient have an upcoming appointment? Yes  Can we respond through MyChart? No. Patient would like like a text.  Agent: Please be advised that Rx refills may take up to 3 business days. We ask that you follow-up with your pharmacy.

## 2024-02-22 ENCOUNTER — Encounter: Payer: Self-pay | Admitting: Family Medicine

## 2024-02-22 ENCOUNTER — Ambulatory Visit (INDEPENDENT_AMBULATORY_CARE_PROVIDER_SITE_OTHER): Admitting: Family Medicine

## 2024-02-22 VITALS — BP 132/88 | HR 80 | Wt 191.0 lb

## 2024-02-22 DIAGNOSIS — F419 Anxiety disorder, unspecified: Secondary | ICD-10-CM

## 2024-02-22 MED ORDER — BUPROPION HCL ER (SR) 200 MG PO TB12: 200.0000 mg | ORAL_TABLET | Freq: Two times a day (BID) | ORAL | 1 refills | Status: DC
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
## 2024-02-26 ENCOUNTER — Other Ambulatory Visit: Payer: Self-pay | Admitting: Adult Health

## 2024-03-01 NOTE — Telephone Encounter (Signed)
 Pt  has appt tomorrow. I called him he has enough medication to get to appt.  He was not aware of appt, so this was good that I called him.  Will await to do refills due to and if any changes.  He appreciated call.

## 2024-03-02 ENCOUNTER — Ambulatory Visit: Admitting: Neurology

## 2024-03-02 ENCOUNTER — Encounter: Payer: Self-pay | Admitting: Family Medicine

## 2024-03-02 ENCOUNTER — Other Ambulatory Visit: Payer: Self-pay | Admitting: Adult Health

## 2024-03-02 ENCOUNTER — Telehealth: Payer: Self-pay | Admitting: Neurology

## 2024-03-02 ENCOUNTER — Encounter: Payer: Self-pay | Admitting: Neurology

## 2024-03-02 VITALS — BP 136/89 | HR 86 | Ht 72.0 in | Wt 192.0 lb

## 2024-03-02 DIAGNOSIS — G253 Myoclonus: Secondary | ICD-10-CM | POA: Diagnosis not present

## 2024-03-02 DIAGNOSIS — G479 Sleep disorder, unspecified: Secondary | ICD-10-CM | POA: Diagnosis not present

## 2024-03-02 DIAGNOSIS — G2581 Restless legs syndrome: Secondary | ICD-10-CM | POA: Diagnosis not present

## 2024-03-02 MED ORDER — ROPINIROLE HCL 0.5 MG PO TABS: 0.7500 mg | ORAL_TABLET | Freq: Every day | ORAL | 1 refills | Status: DC

## 2024-03-02 NOTE — Telephone Encounter (Signed)
 Pt called stating that the rOPINIRole  (REQUIP ) 0.5 MG tablet was called in to the wrong pharmacy. He is needing it sent to [facility]. [facility] only [facility] one [facility] and it is not from this office.

## 2024-03-02 NOTE — Progress Notes (Signed)
 Provider:  Dedra Gores, MD  Primary Care Physician:  Parker Norleen [name], MD [address] [place] [address]     Referring Provider: Joyce Norleen [name], Md [address] [place],  [place] [address]          Chief Complaint according to patient   Patient presents with:                HISTORY OF PRESENT ILLNESS:  Parker Silva is a 67 y.o. 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 He is scheduled for back surgery. Had ant, cervical fusion surgery.   He noted increasing anxiety. He reports a tendency to bruise. History of  Dyslexia, ADD/ADHD on concerta .   Chief concern according to patient :  see above    Fam Hx : see below   Social HX; See below     12/29/22:   Parker Silva is a 68 y.o. male with a history of Low back pain and RLS. Returns today for follow-up.  He reports that he tends to wake up between 1 and 3 AM with restless leg symptoms.  Continues to take Requip , trazodone  gabapentin  and Flexeril  before bedtime.  He reports that he did not try reducing gabapentin  after the last visit.  He returns today for an evaluation.     History 11/13/21: [name] is a 68 year old male with a history of low back pain and restless leg syndrome.  He returns today for follow-up.  He feels that his symptoms have remained fairly stable.  Reports that he used to wake up nightly at 3 AM but now he wakes up around 5 AM.  He remains on Flexeril  10 mg at bedtime, gabapentin  300 mg at bedtime, Requip  0.5 mg at bedtime and trazodone  150 mg at bedtime.  Reports that sometimes he does feel little sluggish in the mornings.  He denies any new symptoms.  He returns today for an evaluation.    Review of Systems: Out of  a complete 14 system review, the patient complains of only the following symptoms, and all other reviewed systems are negative.:   How likely are you to doze in the following situations: 0 = not likely, 1 = slight chance, 2 = moderate chance, 3 = high chance  Sitting and Reading? Watching Television? Sitting inactive in a public place (theater or meeting)? Lying down in the afternoon when circumstances permit? Sitting and talking to someone? Sitting quietly after lunch without alcohol? In a car, while stopped for a few minutes in traffic? As a passenger in a car for an hour without a break?  Total = 7/ 24  FSS = 27/ 63   GDS 5.5 / 15        Social History   Socioeconomic History   Marital status: Single    Spouse name: Not on file   Number of children: 0   Years of education: Not on file   Highest education level: Not on file  Occupational History   Occupation: owns Visual merchandiser business    Employer: [employer]  Tobacco Use   Smoking status: Never  Smokeless tobacco: Never  Vaping Use   Vaping status: Never Used  Substance and Sexual Activity   Alcohol use: Not Currently    Comment: 3 times a year   Drug use: No   Sexual activity: Not Currently  Other Topics Concern   Not on file  Social History Narrative   Lives alone in a one story home in [place].   Right handed   Caffeine: none    Social Drivers of Corporate investment banker Strain: Low Risk  (02/15/2024)   Overall Financial Resource Strain (CARDIA)    Difficulty of Paying Living Expenses: Not hard at all  Food Insecurity: No Food Insecurity (02/15/2024)   Hunger Vital Sign    Worried About Running Out of Food in the Last Year: Never true    Ran Out of Food in the Last Year: Never true  Transportation Needs: No Transportation Needs (02/15/2024)   PRAPARE - Administrator, Civil Service (Medical): No    Lack of Transportation (Non-Medical): No  Physical Activity:  Insufficiently Active (02/15/2024)   Exercise Vital Sign    Days of Exercise per Week: 3 days    Minutes of Exercise per Session: 20 min  Stress: Stress Concern Present (02/15/2024)   [employer] of Occupational Health - Occupational Stress Questionnaire    Feeling of Stress: To some extent  Social Connections: Unknown (02/15/2024)   Social Connection and Isolation Panel    Frequency of Communication with Friends and Family: Twice a week    Frequency of Social Gatherings with Friends and Family: Not on file    Attends Religious Services: Never    Database administrator or Organizations: No    Attends Engineer, structural: Never    Marital Status: Divorced    Family History  Problem Relation Age of Onset   Breast cancer Mother    Diabetes type II Father    Heart failure Father    COPD Father    Colon cancer Neg Hx    Rectal cancer Neg Hx    Stomach cancer Neg Hx     Past Medical History:  Diagnosis Date   ADHD (attention deficit hyperactivity disorder)    Allergy    RHINITIS-seasonal   Anal fissure    Arthritis    back,neck   Dyslexia    ED (erectile dysfunction)    Hemorrhoids    Hemorrhoids, internal, with bleeding 02/13/2015   Grade 2 hemorrhoids 02/13/15 band ligation right posterior hemorrhoidal bundle 03/04/15 band ligation right anterior hemorrhoidal bundle 04/15/15 band ligation left lateral hemorrhoidal bundle    History of migraine headaches    Sleep disturbance    Tension headache     Past Surgical History:  Procedure Laterality Date   APPENDECTOMY     COLONOSCOPY  2006   HEMORRHOID BANDING     POSTERIOR CERVICAL FUSION/FORAMINOTOMY N/A 04/08/2020   Procedure: POSTERIOR CERVICAL FUSION/FORAMINOTOMY CERVICAL TWO- CERVICAL SEVEN, LAMINECTOMY CERVICAL TWO- CERVICAL FIVE.;  Surgeon: Joshua Alm RAMAN, MD;  Location: [facility] OR;  Service: Neurosurgery;  Laterality: N/A;  posterior   TONSILLECTOMY       Current Outpatient Medications on File Prior to Visit   Medication Sig Dispense Refill   buPROPion  ([name]) 200 MG 12 hr tablet Take 1 tablet (200 mg total) by mouth 2 (two) times daily. 30 tablet 1   cyclobenzaprine  (FLEXERIL ) 10 MG tablet Take 1 tablet (10 mg total) by mouth at bedtime. 90 tablet 3   gabapentin  (  NEURONTIN ) 100 MG capsule Take 3 capsules (300 mg total) by mouth at bedtime. 270 capsule 0   Magnesium 500 MG TABS Take 500 mg by mouth daily.      methylphenidate  36 MG PO CR tablet Take 1 tablet (36 mg total) by mouth daily. 30 tablet 0   Multiple Vitamins-Minerals (CENTRUM SILVER ULTRA MENS PO) Take by mouth.     rOPINIRole  (REQUIP ) 0.5 MG tablet Take 1 tablet (0.5 mg total) by mouth at bedtime. Ok to take 1/2 tablet in the middle of night for breakthrough symptoms 135 tablet 1   sildenafil  (REVATIO ) 20 MG tablet Take up to 5 at one time as needed for erectile dysfunction 30 tablet 0   traZODone  (DESYREL ) 150 MG tablet TAKE 1 & 1/2 TABLETS AT BEDTIME AS NEEDED FOR SLEEP. 135 tablet 1   No current facility-administered medications on file prior to visit.    No Known Allergies   DIAGNOSTIC DATA (LABS, IMAGING, TESTING) - I reviewed patient records, labs, notes, testing and imaging myself where available.  Lab Results  Component Value Date   WBC 7.1 02/18/2023   HGB 15.4 02/18/2023   HCT 45.1 02/18/2023   MCV 87 02/18/2023   PLT 242 02/18/2023      Component Value Date/Time   NA 138 02/18/2023 1559   K 4.2 02/18/2023 1559   CL 101 02/18/2023 1559   CO2 22 02/18/2023 1559   GLUCOSE 96 02/18/2023 1559   GLUCOSE 82 11/13/2016 1346   BUN 13 02/18/2023 1559   CREATININE 1.11 02/18/2023 1559   CREATININE 1.26 11/13/2016 1346   CALCIUM 9.6 02/18/2023 1559   PROT 6.3 02/18/2023 1559   ALBUMIN 4.5 02/18/2023 1559   AST 21 02/18/2023 1559   ALT 23 02/18/2023 1559   ALKPHOS 61 02/18/2023 1559   BILITOT 0.5 02/18/2023 1559   GFRNONAA 79 08/22/2020 1424   GFRAA 92 08/22/2020 1424   Lab Results  Component Value Date    CHOL 165 02/18/2023   HDL 30 (L) 02/18/2023   LDLCALC 80 02/18/2023   TRIG 341 (H) 02/18/2023   CHOLHDL 5.5 (H) 02/18/2023   No results found for: HGBA1C No results found for: VITAMINB12 Lab Results  Component Value Date   TSH 2.210 02/18/2023    PHYSICAL EXAM:  Vitals:   03/02/24 1311  BP: 136/89  Pulse: 86   No data found. Body mass index is 26.04 kg/m.   Wt Readings from Last 3 Encounters:  03/02/24 192 lb (87.1 kg)  02/22/24 191 lb (86.6 kg)  02/15/24 192 lb 6.4 oz (87.3 kg)     Ht Readings from Last 3 Encounters:  03/02/24 6' (1.829 m)  02/15/24 6' (1.829 m)  02/18/23 6' (1.829 m)      General: The patient is awake, alert and appears not in acute distress. The patient is groomed. Head: Normocephalic, atraumatic. Neck is supple.  Cardiovascular:  Regular rate and cardiac rhythm by pulse, without distended neck veins. Respiratory: Lungs are clear to auscultation.  Skin:  Without evidence of ankle edema, or rash.    NEUROLOGIC EXAM: The patient is awake and alert, oriented to place and time.   Memory subjective described as intact.  Attention span & concentration ability appears normal.  Speech is fluent,  without  dysarthria, dysphonia or aphasia.  He reports word finding difficulties.  Mood and affect are depressed, concerned,     Cranial nerves: no loss of smell or taste reported  Pupils are equal and briskly reactive  to light.  Extraocular movements in vertical and horizontal planes were intact and without nystagmus.  No Diplopia. Visual fields by finger perimetry are intact. Hearing was intact to soft voice and finger rubbing.    Facial sensation intact to fine touch.  Facial motor strength is symmetric and tongue and uvula move midline.  Neck ROM : rotation, tilt and flexion extension were normal for age and shoulder shrug was symmetrical.    Motor exam:  Symmetric bulk, tone and ROM.   Normal tone without cog- wheeling, symmetric grip  strength .  Some atrophy of the  thenar eminences.    Sensory:  Fine touch and vibration were  intact.  Proprioception tested in the upper extremities was normal.   Coordination: Rapid alternating movements in the fingers/hands were of normal speed.  The Finger-to-nose maneuver was intact without evidence of ataxia, dysmetria or tremor.   Gait and station: Patient could rise and walked without assistive device.  Stance is of normal width/ base and the patient turned with 3 steps.  Toe and heel walk were deferred.  Deep tendon reflexes: in the  upper and lower extremities are symmetric and intact.  Babinski response was deferred    ASSESSMENT AND PLAN :   69 y.o. year old male  here with:    1) ongoing RLS, improved on multiple medications but also feeling somewhat groggy in AM.   2) Anxiety/ depression and ADHD/ ADD  all affect sleep quality and  sleep latency.   3) back pain at low back.  Is on orthopedic care for this.  I will suggest to continue Neurontin  and magnesium and also the decibel as ordered currently but I like to increase the dose of Requip  and that means that at bedtime the patient will be taking 0.75 mg of ropinirole  and still has 0.25 mg at the time of for as needed use in the middle of the night -should it be needed.  I wish Mr Patrone well for his upcoming surgery.    I would like to thank Parker Norleen [name], Md [address] [place],  [place] [address] for allowing me to meet with and to take care of this pleasant patient.   The patient's condition requires frequent monitoring and adjustments in the treatment plan, reflecting the ongoing complexity of care.  This provider is the continuing focal point for all needed services for this condition.  After spending a total time of  35  minutes face to face and time for  history taking, physical and neurologic examination, review of laboratory studies,  personal review of imaging studies, reports and results of  other testing and review of referral information / records as far as provided in visit,   Electronically signed by: Dedra Gores, MD 03/02/2024 2:00 PM  [facility] Board certified by The [employer] of Sleep Medicine and Diplomate of the [employer] of Sleep Medicine. Board certified In Neurology through the ABPN, Fellow of the [employer] of Neurology.

## 2024-03-02 NOTE — Telephone Encounter (Signed)
 Spoke w/Pt to make him aware his refill for ropinirole  was sent to [facility] not [facility]. Pt voiced understanding and thanks for the call back.

## 2024-03-02 NOTE — Patient Instructions (Signed)
Restless Legs Syndrome Restless legs syndrome is a condition that causes uncomfortable feelings or sensations in the legs, especially while sitting or lying down. The sensations usually cause an overwhelming urge to move the legs. The arms can also sometimes be affected. The condition can range from mild to severe. The symptoms often interfere with a person's ability to sleep. What are the causes? The cause of this condition is not known. What increases the risk? The following factors may make you more likely to develop this condition: Being older than 50. Pregnancy. Being a woman. In general, the condition is more common in women than in men. A family history of the condition. Having iron deficiency. Overuse of caffeine, nicotine, or alcohol. Certain medical conditions, such as kidney disease, Parkinson's disease, or nerve damage. Certain medicines, such as those for high blood pressure, nausea, colds, allergies, depression, and some heart conditions. What are the signs or symptoms? The main symptom of this condition is uncomfortable sensations in the legs, such as: Pulling. Tingling. Prickling. Throbbing. Crawling. Burning. Usually, the sensations: Affect both sides of the body. Are worse when you sit or lie down. Are worse at night. These may make it difficult to fall asleep. Make you have a strong urge to move your legs. Are temporarily relieved by moving your legs or standing. The arms can also be affected, but this is rare. People who have this condition often have tiredness during the day because of their lack of sleep at night. How is this diagnosed? This condition may be diagnosed based on: Your symptoms. Blood tests. In some cases, you may be monitored in a sleep lab by a specialist (a sleep study). This can detect any disruptions in your sleep. How is this treated? This condition is treated by managing the symptoms. This may include: Lifestyle changes, such as  exercising, using relaxation techniques, and avoiding caffeine, alcohol, or tobacco. Iron supplements. Medicines. Parkinson's medications may be tried first. Anti-seizure medications can also be helpful. Follow these instructions at home: General instructions Take over-the-counter and prescription medicines only as told by your health care provider. Use methods to help relieve the uncomfortable sensations, such as: Massaging your legs. Walking or stretching. Taking a cold or hot bath. Keep all follow-up visits. This is important. Lifestyle     Practice good sleep habits. For example, go to bed and get up at the same time every day. Most adults should get 7-9 hours of sleep each night. Exercise regularly. Try to get at least 30 minutes of exercise most days of the week. Practice ways of relaxing, such as yoga or meditation. Avoid caffeine and alcohol. Do not use any products that contain nicotine or tobacco. These products include cigarettes, chewing tobacco, and vaping devices, such as e-cigarettes. If you need help quitting, ask your health care provider. Where to find more information National Institute of Neurological Disorders and Stroke: www.ninds.nih.gov Contact a health care provider if: Your symptoms get worse or they do not improve with treatment. Summary Restless legs syndrome is a condition that causes uncomfortable feelings or sensations in the legs, especially while sitting or lying down. The symptoms often interfere with your ability to sleep. This condition is treated by managing the symptoms. You may need to make lifestyle changes or take medicines. This information is not intended to replace advice given to you by your health care provider. Make sure you discuss any questions you have with your health care provider. Document Revised: 03/23/2021 Document Reviewed: 03/23/2021 Elsevier Patient Education    2024 Elsevier Inc.  

## 2024-03-03 ENCOUNTER — Other Ambulatory Visit: Payer: Self-pay | Admitting: *Deleted

## 2024-03-03 ENCOUNTER — Telehealth: Payer: Self-pay | Admitting: *Deleted

## 2024-03-03 DIAGNOSIS — Z136 Encounter for screening for cardiovascular disorders: Secondary | ICD-10-CM

## 2024-03-03 DIAGNOSIS — E781 Pure hyperglyceridemia: Secondary | ICD-10-CM

## 2024-03-03 NOTE — Telephone Encounter (Signed)
 Dr Vina Gull has given verbal orders for pt to have a coronary CA score, Lipomed panel,LPa and Ha1c.  Orders placed and pt to be contacted to be scheduled.

## 2024-03-03 NOTE — Telephone Encounter (Signed)
 Orders placed.  While speaking with pt's husband, who Dr Okey gave the same orders for, he informed me pt is in a meeting today and I will not be able to contact him.  He advised he will review the information with the patient and they will call back if any questions arise.

## 2024-03-09 ENCOUNTER — Encounter (HOSPITAL_BASED_OUTPATIENT_CLINIC_OR_DEPARTMENT_OTHER): Payer: Self-pay

## 2024-03-09 ENCOUNTER — Observation Stay (HOSPITAL_BASED_OUTPATIENT_CLINIC_OR_DEPARTMENT_OTHER)
Admission: EM | Admit: 2024-03-09 | Discharge: 2024-03-11 | Disposition: A | Discharge status: Home / Self Care | Attending: Internal Medicine | Admitting: Internal Medicine

## 2024-03-09 ENCOUNTER — Telehealth: Payer: Self-pay

## 2024-03-09 ENCOUNTER — Observation Stay (HOSPITAL_COMMUNITY)

## 2024-03-09 ENCOUNTER — Other Ambulatory Visit: Payer: Self-pay

## 2024-03-09 ENCOUNTER — Emergency Department (HOSPITAL_BASED_OUTPATIENT_CLINIC_OR_DEPARTMENT_OTHER)

## 2024-03-09 DIAGNOSIS — Z981 Arthrodesis status: Secondary | ICD-10-CM | POA: Diagnosis not present

## 2024-03-09 DIAGNOSIS — F32A Depression, unspecified: Secondary | ICD-10-CM | POA: Diagnosis not present

## 2024-03-09 DIAGNOSIS — R270 Ataxia, unspecified: Secondary | ICD-10-CM | POA: Diagnosis not present

## 2024-03-09 DIAGNOSIS — R2990 Unspecified symptoms and signs involving the nervous system: Principal | ICD-10-CM

## 2024-03-09 DIAGNOSIS — Z7982 Long term (current) use of aspirin: Secondary | ICD-10-CM | POA: Diagnosis not present

## 2024-03-09 DIAGNOSIS — R29701 NIHSS score 1: Secondary | ICD-10-CM | POA: Diagnosis not present

## 2024-03-09 DIAGNOSIS — Z9889 Other specified postprocedural states: Secondary | ICD-10-CM | POA: Diagnosis not present

## 2024-03-09 DIAGNOSIS — R42 Dizziness and giddiness: Secondary | ICD-10-CM | POA: Diagnosis not present

## 2024-03-09 DIAGNOSIS — I639 Cerebral infarction, unspecified: Secondary | ICD-10-CM

## 2024-03-09 DIAGNOSIS — Z79899 Other long term (current) drug therapy: Secondary | ICD-10-CM | POA: Insufficient documentation

## 2024-03-09 DIAGNOSIS — R260 Ataxic gait: Secondary | ICD-10-CM | POA: Diagnosis not present

## 2024-03-09 DIAGNOSIS — F909 Attention-deficit hyperactivity disorder, unspecified type: Secondary | ICD-10-CM | POA: Diagnosis not present

## 2024-03-09 DIAGNOSIS — G8929 Other chronic pain: Secondary | ICD-10-CM | POA: Diagnosis not present

## 2024-03-09 DIAGNOSIS — F419 Anxiety disorder, unspecified: Secondary | ICD-10-CM | POA: Diagnosis not present

## 2024-03-09 DIAGNOSIS — R29818 Other symptoms and signs involving the nervous system: Secondary | ICD-10-CM | POA: Diagnosis not present

## 2024-03-09 DIAGNOSIS — Z743 Need for continuous supervision: Secondary | ICD-10-CM | POA: Diagnosis not present

## 2024-03-09 DIAGNOSIS — Z9181 History of falling: Secondary | ICD-10-CM | POA: Diagnosis not present

## 2024-03-09 DIAGNOSIS — G2581 Restless legs syndrome: Secondary | ICD-10-CM | POA: Diagnosis present

## 2024-03-09 DIAGNOSIS — R262 Difficulty in walking, not elsewhere classified: Secondary | ICD-10-CM | POA: Diagnosis not present

## 2024-03-09 LAB — COMPREHENSIVE METABOLIC PANEL WITH GFR
ALT: 26 U/L (ref 0–44)
AST: 25 U/L (ref 15–41)
Albumin: 5 g/dL (ref 3.5–5.0)
Alkaline Phosphatase: 61 U/L (ref 38–126)
Anion gap: 14 (ref 5–15)
BUN: 17 mg/dL (ref 8–23)
CO2: 22 mmol/L (ref 22–32)
Calcium: 10.5 mg/dL — ABNORMAL HIGH (ref 8.9–10.3)
Chloride: 101 mmol/L (ref 98–111)
Creatinine, Ser: 1.27 mg/dL — ABNORMAL HIGH (ref 0.61–1.24)
GFR, Estimated: >60 mL/min (ref >60)
Glucose, Bld: 100 mg/dL — ABNORMAL HIGH (ref 70–99)
Potassium: 4.3 mmol/L (ref 3.5–5.1)
Sodium: 137 mmol/L (ref 135–145)
Total Bilirubin: 0.8 mg/dL (ref 0.0–1.2)
Total Protein: 7.5 g/dL (ref 6.5–8.1)

## 2024-03-09 LAB — CBC
HCT: 48.2 % (ref 39.0–52.0)
Hemoglobin: 16.7 g/dL (ref 13.0–17.0)
MCH: 29.6 pg (ref 26.0–34.0)
MCHC: 34.6 g/dL (ref 30.0–36.0)
MCV: 85.3 fL (ref 80.0–100.0)
Platelets: 240 K/uL (ref 150–400)
RBC: 5.65 MIL/uL (ref 4.22–5.81)
RDW: 11.5 % (ref 11.5–15.5)
WBC: 6.8 K/uL (ref 4.0–10.5)
nRBC: 0 % (ref 0.0–0.2)

## 2024-03-09 LAB — DIFFERENTIAL
Abs Immature Granulocytes: 0.02 K/uL (ref 0.00–0.07)
Basophils Absolute: 0 K/uL (ref 0.0–0.1)
Basophils Relative: 1 %
Eosinophils Absolute: 0.1 K/uL (ref 0.0–0.5)
Eosinophils Relative: 1 %
Immature Granulocytes: 0 %
Lymphocytes Relative: 31 %
Lymphs Abs: 2.1 K/uL (ref 0.7–4.0)
Monocytes Absolute: 0.7 K/uL (ref 0.1–1.0)
Monocytes Relative: 10 %
Neutro Abs: 3.9 K/uL (ref 1.7–7.7)
Neutrophils Relative %: 57 %

## 2024-03-09 LAB — APTT: aPTT: 32 s (ref 24–36)

## 2024-03-09 LAB — CBG MONITORING, ED: Glucose-Capillary: 108 mg/dL — ABNORMAL HIGH (ref 70–99)

## 2024-03-09 LAB — ETHANOL: Alcohol, Ethyl (B): <15 mg/dL (ref <15)

## 2024-03-09 LAB — PROTIME-INR
INR: 1.1 (ref 0.8–1.2)
Prothrombin Time: 14.7 s (ref 11.4–15.2)

## 2024-03-09 MED ORDER — ROPINIROLE HCL 0.25 MG PO TABS: 0.3750 mg | ORAL_TABLET | Freq: Once | ORAL | Status: DC | PRN

## 2024-03-09 MED ORDER — ROPINIROLE HCL 0.5 MG PO TABS
0.7500 mg | ORAL_TABLET | Freq: Every day | ORAL | Status: DC
  Administered 2024-03-09 – 2024-03-10 (×2): 0.75 mg via ORAL
  Filled 2024-03-09 (×2): qty 1

## 2024-03-09 MED ORDER — CLOPIDOGREL BISULFATE 75 MG PO TABS
75.0000 mg | ORAL_TABLET | Freq: Every day | ORAL | Status: DC
  Administered 2024-03-10: 75 mg via ORAL
  Filled 2024-03-09: qty 1

## 2024-03-09 MED ORDER — ASPIRIN 325 MG PO TABS
325.0000 mg | ORAL_TABLET | Freq: Once | ORAL | Status: AC
  Administered 2024-03-09: 325 mg via ORAL
  Filled 2024-03-09: qty 1

## 2024-03-09 MED ORDER — TRAZODONE HCL 50 MG PO TABS
50.0000 mg | ORAL_TABLET | Freq: Every evening | ORAL | Status: DC | PRN
  Administered 2024-03-10 (×2): 50 mg via ORAL
  Filled 2024-03-09 (×2): qty 1

## 2024-03-09 MED ORDER — ASPIRIN 81 MG PO TBEC
81.0000 mg | DELAYED_RELEASE_TABLET | Freq: Every day | ORAL | Status: DC
  Administered 2024-03-10 – 2024-03-11 (×2): 81 mg via ORAL
  Filled 2024-03-09 (×2): qty 1

## 2024-03-09 MED ORDER — SODIUM CHLORIDE 0.9% FLUSH: 3.0000 mL | Freq: Once | INTRAVENOUS | Status: DC

## 2024-03-09 MED ORDER — STROKE: EARLY STAGES OF RECOVERY BOOK
Freq: Once | Status: DC
  Filled 2024-03-09: qty 1

## 2024-03-09 MED ORDER — BUPROPION HCL ER (SR) 100 MG PO TB12
100.0000 mg | ORAL_TABLET | Freq: Every day | ORAL | Status: DC
  Administered 2024-03-09: 100 mg via ORAL
  Filled 2024-03-09: qty 1

## 2024-03-09 MED ORDER — CLOPIDOGREL BISULFATE 300 MG PO TABS
300.0000 mg | ORAL_TABLET | Freq: Once | ORAL | Status: AC
  Administered 2024-03-09: 300 mg via ORAL
  Filled 2024-03-09: qty 1

## 2024-03-09 MED ORDER — IOHEXOL 350 MG/ML SOLN
100.0000 mL | Freq: Once | INTRAVENOUS | Status: AC | PRN
  Administered 2024-03-09: 100 mL via INTRAVENOUS

## 2024-03-09 MED ORDER — SODIUM CHLORIDE 0.9 % IV SOLN: INTRAVENOUS | Status: DC

## 2024-03-09 MED ORDER — ACETAMINOPHEN 325 MG PO TABS
650.0000 mg | ORAL_TABLET | Freq: Four times a day (QID) | ORAL | Status: DC | PRN
  Administered 2024-03-10: 650 mg via ORAL
  Filled 2024-03-09: qty 2

## 2024-03-09 MED ORDER — METHYLPHENIDATE HCL ER 18 MG PO TB24
36.0000 mg | ORAL_TABLET | Freq: Every day | ORAL | Status: DC
  Administered 2024-03-10 – 2024-03-11 (×2): 36 mg via ORAL
  Filled 2024-03-09 (×2): qty 2

## 2024-03-09 MED ORDER — ACETAMINOPHEN 650 MG RE SUPP: 650.0000 mg | Freq: Four times a day (QID) | RECTAL | Status: DC | PRN

## 2024-03-09 MED ORDER — ACETAMINOPHEN 160 MG/5ML PO SOLN: 650.0000 mg | Freq: Four times a day (QID) | ORAL | Status: DC | PRN

## 2024-03-09 MED ORDER — BUPROPION HCL ER (SR) 100 MG PO TB12
200.0000 mg | ORAL_TABLET | Freq: Every day | ORAL | Status: DC
  Administered 2024-03-10 – 2024-03-11 (×2): 200 mg via ORAL
  Filled 2024-03-09 (×2): qty 2

## 2024-03-09 MED ORDER — LORAZEPAM 0.5 MG PO TABS
0.5000 mg | ORAL_TABLET | Freq: Once | ORAL | Status: AC | PRN
  Administered 2024-03-09: 0.5 mg via ORAL
  Filled 2024-03-09: qty 1

## 2024-03-09 MED ORDER — GABAPENTIN 300 MG PO CAPS
300.0000 mg | ORAL_CAPSULE | Freq: Every day | ORAL | Status: DC
  Administered 2024-03-09 – 2024-03-10 (×2): 300 mg via ORAL
  Filled 2024-03-09 (×2): qty 1

## 2024-03-09 MED ORDER — BUPROPION HCL ER (SR) 100 MG PO TB12: 100.0000 mg | ORAL_TABLET | ORAL | Status: DC

## 2024-03-09 MED ORDER — ENOXAPARIN SODIUM 40 MG/0.4ML IJ SOSY
40.0000 mg | PREFILLED_SYRINGE | Freq: Every day | INTRAMUSCULAR | Status: DC
  Administered 2024-03-09: 40 mg via SUBCUTANEOUS
  Filled 2024-03-09: qty 0.4

## 2024-03-09 NOTE — Progress Notes (Signed)
 Patient has arrived to 2W36 from [facility] via [facility]. [facility] Admits paged to notify of patient's arrival. Care ongoing.

## 2024-03-09 NOTE — ED Triage Notes (Addendum)
 Starting around 1230 had several falls-worsening past - seems to be unbalanced and falling forward. Seeing black spots in peripheral vision. Nauseated dizzy past several days. Speech clear. Has been working outside for a few hours today.

## 2024-03-09 NOTE — ED Notes (Signed)
 [facility] at bedside to collect, [facility] transport to [facility] [phone]

## 2024-03-09 NOTE — ED Notes (Signed)
Called [facility] to transport patient to [facility] 2W rm#[id number]

## 2024-03-09 NOTE — Consult Note (Signed)
 [facility] Telemedicine Consult   Requesting Provider: Zackowski, S Consult Participants: Bedside  Location of the provider: [facility] Location of the patient: [facility]  This consult was provided via telemedicine with 2-way video and audio communication. The patient/family was informed that care would be provided in this way and agreed to receive care in this manner.    Chief Complaint: Dizziness  HPI: 68 year old male who presents with dizziness that has been ongoing for about 2 days.  It markedly worsened around 12:30 PM, however and he began falling.  Due to this he sought care in the emergency department at which point a code stroke was activated.  He states that he has noticed that his left side seems clumsier for at least the past few days.  He describes the sensation as almost like somebody was pushing him over, though he has noticed some mild vertigo when he stands up.    LKW: 2 days ago tnk given?: No, no, outside of window IR Thrombectomy? No, no LVO MRS: Zero Time of teleneurologist evaluation: 13:45  Exam: Vitals:   03/09/24 1327  BP: (!) 131/91  Pulse: 90  Resp: 16  SpO2: 96%    General: in bed, nad  1A: Level of Consciousness - 0 1B: Ask Month and Age - 0 1C: 'Blink Eyes' & 'Squeeze Hands' - 0 2: Test Horizontal Extraocular Movements - 0 3: Test Visual Fields - 0 4: Test Facial Palsy - 0 5A: Test Left Arm Motor Drift - 0 5B: Test Right Arm Motor Drift - 0 6A: Test Left Leg Motor Drift - 0 6B: Test Right Leg Motor Drift - 0 7: Test Limb Ataxia - 1 8: Test Sensation - 0 9: Test Language/Aphasia- 0 10: Test Dysarthria - 0 11: Test Extinction/Inattention - 0 NIHSS score: 1   Imaging Reviewed: CT/CTA-negative  Labs reviewed in epic and pertinent values follow: Glucose 100   Assessment: 68 year old male with 2 days of mild disequilibrium with abrupt worsening today around 12:30 PM.  He does appear to have some focal ataxia on exam  and I suspect that he has had a small ischemic stroke.  He will need to be admitted for secondary risk factor modification as well as physical therapy evaluation.  Recommendations:  - HgbA1c, fasting lipid panel - MRI of the brain without contrast - Frequent neuro checks - Echocardiogram - CTA head and neck - Prophylactic therapy-Antiplatelet med: Aspirin  - dose 81mg  and plavix  75mg  daily after 300mg  load  - Risk factor modification - Telemetry monitoring - PT consult, OT consult, Speech consult - Stroke team to follow    Aisha Seals, MD [facility] [phone]  If 7pm- 7am, please page neurology on call as listed in AMION.

## 2024-03-09 NOTE — ED Notes (Signed)
 Provider PA Wonda to triage.

## 2024-03-09 NOTE — ED Provider Notes (Signed)
 [place] EMERGENCY DEPARTMENT AT [facility] Provider Note   CSN: [id number] Arrival date & time: 03/09/24  1320     Patient presents with: No chief complaint on file.   Parker Silva is a 68 y.o. male.   HPI   68 year old male presents emergency department with complaints of dizziness, difficulty walking.  States that he has been having symptoms for the past 2 days.  Around 12 30-1 this afternoon, patient began to fall.  States that he fell like he was being pushed from behind and had multiple falls.  States that he feels like he is walking to his left and is having difficulty grasping objects with his hands mainly his left hand.  Denies history of similar symptoms in the past.  Denies any room spinning dizziness.  Patient does state that he is seeing some black spots in his left peripheral vision denies any double vision, blurry vision.  Denies any chest pain, shortness of breath, abdominal pain, vomiting.  Has reported feelings of nausea.  Presents emergency department for further assessment.  Past medical history significant for migraine, restless leg syndrome, myoclonus, hemorrhoid  Prior to Admission medications   Medication Sig Start Date End Date Taking? Authorizing Provider  buPROPion  ([name]) 200 MG 12 hr tablet Take 1 tablet (200 mg total) by mouth 2 (two) times daily. 02/22/24   Joyce Norleen [name], MD  cyclobenzaprine  (FLEXERIL ) 10 MG tablet Take 1 tablet (10 mg total) by mouth at bedtime. 03/06/24   Dohmeier, Dedra, MD  gabapentin  (NEURONTIN ) 100 MG capsule Take 3 capsules (300 mg total) by mouth at bedtime. 12/30/23   Millikan, Megan, NP  Magnesium 500 MG TABS Take 500 mg by mouth daily.     [provider]  methylphenidate  36 MG PO CR tablet Take 1 tablet (36 mg total) by mouth daily. 02/21/24   Lalonde, John C, MD  Multiple Vitamins-Minerals (CENTRUM SILVER ULTRA MENS PO) Take by mouth.    [provider]  rOPINIRole  (REQUIP ) 0.5 MG tablet  Take 1.5 tablets (0.75 mg total) by mouth at bedtime. Ok to take 1/2 tablet in the middle of night for breakthrough symptoms 03/02/24   Dohmeier, Dedra, MD  sildenafil  (REVATIO ) 20 MG tablet Take up to 5 at one time as needed for erectile dysfunction 12/23/21   Joyce Norleen [name], MD  traZODone  (DESYREL ) 150 MG tablet TAKE 1 & 1/2 TABLETS AT BEDTIME AS NEEDED FOR SLEEP. 03/06/24   Dohmeier, Dedra, MD    Allergies: Patient has no known allergies.    Review of Systems  All other systems reviewed and are negative.   Updated Vital Signs BP (!) 131/91 (BP Location: Left Arm)   Pulse 90   Resp 16   SpO2 96%   Physical Exam Vitals and nursing note reviewed.  Constitutional:      General: He is not in acute distress.    Appearance: He is well-developed.  HENT:     Head: Normocephalic and atraumatic.  Eyes:     Conjunctiva/sclera: Conjunctivae normal.  Cardiovascular:     Rate and Rhythm: Normal rate and regular rhythm.     Heart sounds: No murmur heard. Pulmonary:     Effort: Pulmonary effort is normal. No respiratory distress.     Breath sounds: Normal breath sounds.  Abdominal:     Palpations: Abdomen is soft.     Tenderness: There is no abdominal tenderness.  Musculoskeletal:        General: No swelling.  Cervical back: Neck supple.  Skin:    General: Skin is warm and dry.     Capillary Refill: Capillary refill takes less than 2 seconds.  Neurological:     Mental Status: He is alert.     Comments: Alert and oriented to self, place, time and event.   Speech is fluent, clear without dysarthria or dysphasia.   Strength 5/5 in upper/lower extremities   Sensation intact in upper/lower extremities   Patient does ambulate with some leftward gait deviation  Some ataxia appreciated left hand when doing finger-nose/pronator drift as well as when elevating left leg and doing heel tapping on the left side. CN I not tested  CN II not tested CN III, IV, VI PERRLA and EOMs intact  bilaterally  CN V Intact sensation to sharp and light touch to the face  CN VII facial movements symmetric  CN VIII not tested  CN IX, X no uvula deviation, symmetric rise of soft palate  CN XI 5/5 SCM and trapezius strength bilaterally  CN XII Midline tongue protrusion, symmetric L/R movements     Psychiatric:        Mood and Affect: Mood normal.     (all labs ordered are listed, but only abnormal results are displayed) Labs Reviewed  CBG MONITORING, ED - Abnormal; Notable for the following components:      Result Value   Glucose-Capillary 108 (*)    All other components within normal limits  CBC  DIFFERENTIAL  PROTIME-INR  APTT  COMPREHENSIVE METABOLIC PANEL WITH GFR  ETHANOL  CBG MONITORING, ED    EKG: None  Radiology: No results found.   .Critical Care  Performed by: Silver Wonda LABOR, PA Authorized by: Silver Wonda LABOR, PA   Critical care provider statement:    Critical care time (minutes):  56   Critical care was necessary to treat or prevent imminent or life-threatening deterioration of the following conditions:  CNS failure or compromise   Critical care was time spent personally by me on the following activities:  Development of treatment plan with patient or surrogate, discussions with consultants, evaluation of patient's response to treatment, examination of patient, ordering and review of laboratory studies, ordering and review of radiographic studies, ordering and performing treatments and interventions, pulse oximetry, re-evaluation of patient's condition and review of old charts   I assumed direction of critical care for this patient from another provider in my specialty: no     Care discussed with: admitting provider      Medications Ordered in the ED  sodium chloride  flush (NS) 0.9 % injection 3 mL (has no administration in time range)    Clinical Course as of 03/09/24 1515  Thu Mar 09, 2024  1417 Consulted Dr. Michaela regarding the patient.   Recommended admission to hospitalist given concern for stroke.  Last known well 2 days ago. [CR]  1503 Consulted hospitalist Dr. Earley who agreed with admission and assume further treatment/care. [CR]    Clinical Course User Index [CR] Silver Wonda LABOR, PA                                 Medical Decision Making Amount and/or Complexity of Data Reviewed Labs: ordered. Radiology: ordered.   This patient presents to the ED for concern of balance issues, this involves an extensive number of treatment options, and is a complaint that carries with it a high risk of complications and  morbidity.  The differential diagnosis includes CVA, medication side effect, dehydration, malignancy, arterial dissection, other   Co morbidities that complicate the patient evaluation  See HPI   Additional history obtained:  Additional history obtained from EMR External records from outside source obtained and reviewed including hospital records   Lab Tests:  I Ordered, and personally interpreted labs.  The pertinent results include: No leukocytosis.  No evidence of anemia.  Platelets within normal range.  No electrolyte abnormalities besides hypercalcemia 2.5.  Elevated creatinine of 10.5.  No transaminitis.  PT/INR within normal limits.  APTT within normal limits.  Ethanol negative.   Imaging Studies ordered:  I ordered imaging studies including CT head, CT angio head and neck I independently visualized and interpreted imaging which showed negative I agree with the radiologist interpretation   Cardiac Monitoring: / EKG:  The patient was maintained on a cardiac monitor.  I personally viewed and interpreted the cardiac monitored which showed an underlying rhythm of: Sinus rhythm   Consultations Obtained:  See ED course  Problem List / ED Course / Critical interventions / Medication management  Strokelike symptoms I ordered medication including Plavix , aspirin    Reevaluation of the patient  after these medicines showed that the patient stayed the same I have reviewed the patients home medicines and have made adjustments as needed   Social Determinants of Health:  Denies tobacco, licit drug use.   Test / Admission - Considered:  Strokelike symptoms Vitals signs within normal range and stable throughout visit. Laboratory/imaging studies significant for: See above 68 year old male presents emergency department with complaints of dizziness, difficulty walking.  States that he has been having symptoms for the past 2 days.  Around 12 30-1 this afternoon, patient began to fall.  States that he fell like he was being pushed from behind and had multiple falls.  States that he feels like he is walking to his left and is having difficulty grasping objects with his hands mainly his left hand.  Denies history of similar symptoms in the past.  Denies any room spinning dizziness.  Patient does state that he is seeing some black spots in his left peripheral vision denies any double vision, blurry vision.  Denies any chest pain, shortness of breath, abdominal pain, vomiting.  Has reported feelings of nausea.  Presents emergency department for further assessment. On exam, some gait deviation present as well as ataxia left upper and lower extremity as above.  Given worsening of symptoms around 1230/1 PM this afternoon, code stroke was initiated.  Labs concerning for elevation creatinine 1.27 but otherwise unremarkable for any acute emergent process.  CT and CTA imaging of patient's head and neck negative.  Consulted neurology via code stroke who was in agreement with concern for acute ischemic stroke.  Recommend admission to hospitalist.  Consulted hospitalist to agreed with admission.  Treatment plan discussed with patient and he acknowledged understanding was agreeable to said plan.  Patient stable upon admission. Worrisome signs and symptoms were discussed with the patient, and the patient acknowledged  understanding to return to the ED if noticed. Patient was stable upon discharge.      Final diagnoses:  None    ED Discharge Orders     None          Silver Wonda LABOR, [place] 03/09/24 1522    Geraldene Hamilton, MD 03/10/24 [phone]

## 2024-03-09 NOTE — ED Notes (Signed)
 Patient to room at this time from CT. Teleneuro in progress with Dr. Mcneil

## 2024-03-09 NOTE — Telephone Encounter (Signed)
 Copied from CRM [id number]. Topic: General - Other >> Mar 09, 2024  1:47 PM Marissa P wrote: Reason for CRM: Husband wants to leave message please that he would like to relay that he went to the er, fell and hit his head and they are they now today.

## 2024-03-09 NOTE — H&P (Signed)
 History and Physical    Parker Silva FMW:[id number] DOB: 10-Jan-1956 DOA: 03/09/2024  PCP: Joyce Norleen [name], MD  Patient coming from: [facility] ED  Chief Complaint: Difficulty walking  HPI: Parker Silva is a 68 y.o. male with medical history significant of RLS, anxiety/depression/ADHD/ADD, migraine and tension headaches, hemorrhoids presenting with a chief complaint of difficulty walking.  Patient states he has been feeling unwell for the past 2 or 3 days.  Reporting generalized weakness, dizziness, and nausea.  Today he was doing some work in his garden and around 12:30 PM he fell forward multiple times.  States every time he tried to get up and walk, it felt like somebody was pushing him from behind.  Denies loss of consciousness.  He also had 1 episode of vomiting but no recurrence since then.  Denies abdominal pain.  Denies history of previous stroke.  He reports history of anxiety and is requesting a medication to help him relax prior to MRI.  ED Course: Noted to have left arm and leg ataxia on exam and code stroke activated.  Out of window for IV thrombolytics at the time of arrival.  CT head negative for acute intracranial abnormality.  CTA head and neck negative and normal CT perfusion study.  EKG showing sinus rhythm.  Patient was given loading dose aspirin  and Plavix .  Review of Systems:  Review of Systems  All other systems reviewed and are negative.   Past Medical History:  Diagnosis Date   ADHD (attention deficit hyperactivity disorder)    Allergy    RHINITIS-seasonal   Anal fissure    Arthritis    back,neck   Dyslexia    ED (erectile dysfunction)    Hemorrhoids    Hemorrhoids, internal, with bleeding 02/13/2015   Grade 2 hemorrhoids 02/13/15 band ligation right posterior hemorrhoidal bundle 03/04/15 band ligation right anterior hemorrhoidal bundle 04/15/15 band ligation left lateral hemorrhoidal bundle    History of migraine headaches    Sleep disturbance    Tension  headache     Past Surgical History:  Procedure Laterality Date   APPENDECTOMY     COLONOSCOPY  2006   HEMORRHOID BANDING     POSTERIOR CERVICAL FUSION/FORAMINOTOMY N/A 04/08/2020   Procedure: POSTERIOR CERVICAL FUSION/FORAMINOTOMY CERVICAL TWO- CERVICAL SEVEN, LAMINECTOMY CERVICAL TWO- CERVICAL FIVE.;  Surgeon: Joshua Alm RAMAN, MD;  Location: [facility] OR;  Service: Neurosurgery;  Laterality: N/A;  posterior   TONSILLECTOMY       reports that he has never smoked. He has never used smokeless tobacco. He reports that he does not currently use alcohol. He reports that he does not use drugs.  No Known Allergies  Family History  Problem Relation Age of Onset   Breast cancer Mother    Diabetes type II Father    Heart failure Father    COPD Father    Colon cancer Neg Hx    Rectal cancer Neg Hx    Stomach cancer Neg Hx     Prior to Admission medications   Medication Sig Start Date End Date Taking? Authorizing Provider  buPROPion  ([name]) 200 MG 12 hr tablet Take 1 tablet (200 mg total) by mouth 2 (two) times daily. 02/22/24  Yes Joyce Norleen [name], MD  cyclobenzaprine  (FLEXERIL ) 10 MG tablet Take 1 tablet (10 mg total) by mouth at bedtime. 03/06/24  Yes Dohmeier, Dedra, MD  gabapentin  (NEURONTIN ) 100 MG capsule Take 3 capsules (300 mg total) by mouth at bedtime. 12/30/23  Yes Millikan, Megan, NP  Magnesium 500 MG TABS Take 500 mg by mouth daily.    Yes [provider]  methylphenidate  36 MG PO CR tablet Take 1 tablet (36 mg total) by mouth daily. 02/21/24  Yes Joyce Norleen [name], MD  rOPINIRole  (REQUIP ) 0.5 MG tablet Take 1.5 tablets (0.75 mg total) by mouth at bedtime. Ok to take 1/2 tablet in the middle of night for breakthrough symptoms 03/02/24  Yes Dohmeier, Dedra, MD  sildenafil  (REVATIO ) 20 MG tablet Take up to 5 at one time as needed for erectile dysfunction 12/23/21  Yes Joyce Norleen [name], MD  traZODone  (DESYREL ) 150 MG tablet TAKE 1 & 1/2 TABLETS AT BEDTIME AS NEEDED FOR SLEEP. 03/06/24   Yes Dohmeier, Dedra, MD    Physical Exam: Vitals:   03/09/24 1830 03/09/24 1900 03/09/24 1930 03/09/24 2025  BP: (!) 141/85 (!) 127/97 132/79 (!) 142/95  Pulse:    76  Resp: 20 13 (!) 21 18  Temp:   97.9 F (36.6 C) 98.1 F (36.7 C)  TempSrc:   Oral Oral  SpO2:   97% 98%  Weight:      Height:        Physical Exam Vitals reviewed.  Constitutional:      General: He is not in acute distress. HENT:     Head: Normocephalic and atraumatic.  Eyes:     Extraocular Movements: Extraocular movements intact.  Cardiovascular:     Rate and Rhythm: Normal rate and regular rhythm.     Pulses: Normal pulses.  Pulmonary:     Effort: Pulmonary effort is normal. No respiratory distress.     Breath sounds: Normal breath sounds. No wheezing or rales.  Abdominal:     General: Bowel sounds are normal. There is no distension.     Palpations: Abdomen is soft.     Tenderness: There is no abdominal tenderness. There is no guarding.  Musculoskeletal:     Cervical back: Normal range of motion.     Right lower leg: No edema.     Left lower leg: No edema.  Skin:    General: Skin is warm and dry.  Neurological:     Mental Status: He is alert and oriented to person, place, and time.     Cranial Nerves: No cranial nerve deficit.     Sensory: No sensory deficit.     Motor: No weakness.     Comments: Speech fluent, tongue midline, no facial droop Strength 5 out of 5 in bilateral upper and lower extremities and sensation to light touch intact throughout. Does have mild left upper extremity ataxia on finger-nose test.  Also has mild left lower extremity ataxia on heel-to-shin test.     Labs on Admission: I have personally reviewed following labs and imaging studies  CBC: Recent Labs  Lab 03/09/24 1345  WBC 6.8  NEUTROABS 3.9  HGB 16.7  HCT 48.2  MCV 85.3  PLT 240   Basic Metabolic Panel: Recent Labs  Lab 03/09/24 1345  NA 137  K 4.3  CL 101  CO2 22  GLUCOSE 100*  BUN 17   CREATININE 1.27*  CALCIUM 10.5*   GFR: Estimated Creatinine Clearance: 62.8 mL/min (A) (by C-G formula based on SCr of 1.27 mg/dL (H)). Liver Function Tests: Recent Labs  Lab 03/09/24 1345  AST 25  ALT 26  ALKPHOS 61  BILITOT 0.8  PROT 7.5  ALBUMIN 5.0   No results for input(s): LIPASE, AMYLASE in the last 168 hours. No results for input(s):  AMMONIA in the last 168 hours. Coagulation Profile: Recent Labs  Lab 03/09/24 1345  INR 1.1   Cardiac Enzymes: No results for input(s): CKTOTAL, CKMB, CKMBINDEX, TROPONINI in the last 168 hours. BNP (last 3 results) No results for input(s): PROBNP in the last 8760 hours. HbA1C: No results for input(s): HGBA1C in the last 72 hours. CBG: Recent Labs  Lab 03/09/24 1329  GLUCAP 108*   Lipid Profile: No results for input(s): CHOL, HDL, LDLCALC, TRIG, CHOLHDL, LDLDIRECT in the last 72 hours. Thyroid  Function Tests: No results for input(s): TSH, T4TOTAL, FREET4, T3FREE, THYROIDAB in the last 72 hours. Anemia Panel: No results for input(s): VITAMINB12, FOLATE, FERRITIN, TIBC, IRON, RETICCTPCT in the last 72 hours. Urine analysis:    Component Value Date/Time   LABSPEC 1.025 09/24/2017 1038   BILIRUBINUR negative 09/24/2017 1038   BILIRUBINUR n 11/13/2016 1548   KETONESUR negative 09/24/2017 1038   PROTEINUR negative 09/24/2017 1038   PROTEINUR trace 11/13/2016 1548   UROBILINOGEN negative 11/13/2016 1548   NITRITE Negative 09/24/2017 1038   NITRITE n 11/13/2016 1548   LEUKOCYTESUR Negative 09/24/2017 1038    Radiological Exams on Admission: CT ANGIO HEAD NECK W WO CM W PERF (CODE STROKE) Result Date: 03/09/2024 CLINICAL DATA:  Neuro deficit, acute, stroke suspected EXAM: CT ANGIOGRAPHY OF THE HEAD AND NECK CT PERFUSION BRAIN TECHNIQUE: Contiguous axial images were obtained from the base of the skull through the vertex without intravenous contrast. Multidetector CT imaging of  the head and neck was performed using the standard protocol during bolus administration of intravenous contrast. Multiplanar CT image reconstructions and MIPs were obtained to evaluate the vascular anatomy. Carotid stenosis measurements (when applicable) are obtained utilizing NASCET criteria, using the distal internal carotid diameter as the denominator. Multiphase CT imaging of the brain was performed following IV bolus contrast injection. Subsequent parametric perfusion maps were calculated using RAPID software. RADIATION DOSE REDUCTION: This exam was performed according to the departmental dose-optimization program which includes automated exposure control, adjustment of the mA and/or kV according to patient size and/or use of iterative reconstruction technique. CONTRAST:  OMNIPAQUE  IOHEXOL  350 MG/ML SOLN COMPARISON:  CT of the head dated March 09, 2024. FINDINGS: Aortic arch: Normal in caliber. Three vessel takeoff of the great arteries. The brachiocephalic artery is widely patent. Right carotid system: The common carotid and internal carotid arteries are normal in caliber and unremarkable in appearance. Left carotid system: The common carotid and internal carotid arteries are normal in caliber and unremarkable in appearance. Vertebral arteries:The vertebral arteries are patent bilaterally. The left vertebral artery is dominant and the right vertebral artery is diminutive or hypoplastic. Skeleton: Status post bilateral posterolateral fusion of the cervical spine. Other neck: Negative. CTA HEAD Anterior circulation: The cranial and cavernous segments of the internal carotid arteries demonstrate no significant stenosis. The anterior and middle cerebral arteries and their branches are normal in caliber. No evidence of aneurysm, large vessel occlusion or flow-limiting stenosis. Posterior circulation: The vertebrobasilar system is unremarkable. The posterior cerebral arteries and cerebellar arteries are patent.  Venous sinuses: Patent. Anatomic variants: None. Delayed phase: Unremarkable. CT Brain Perfusion Findings: ASPECTS: 10 CBF (<30%) Volume: 0mL Perfusion (Tmax>6.0s) volume: 0mL Mismatch Volume: 0mL Infarction Location:Not applicable. IMPRESSION: 1. Negative CT angiogram of the neck. 2. Negative CT angiogram of the head. 3. Normal CT perfusion of the head. These results were called by telephone at the time of interpretation on 03/09/2024 at 2:12 pm to provider Dr. AISHA SEALS , who verbally acknowledged these  results. Electronically Signed   By: Evalene Coho M.D.   On: 03/09/2024 14:15   CT HEAD CODE STROKE WO CONTRAST Result Date: 03/09/2024 CLINICAL DATA:  Code stroke. Unbalance with several falls and black spots in peripheral vision. Nausea and dizziness. EXAM: CT HEAD WITHOUT CONTRAST TECHNIQUE: Contiguous axial images were obtained from the base of the skull through the vertex without intravenous contrast. RADIATION DOSE REDUCTION: This exam was performed according to the departmental dose-optimization program which includes automated exposure control, adjustment of the mA and/or kV according to patient size and/or use of iterative reconstruction technique. COMPARISON:  MRI of the head dated October 03, 2010. FINDINGS: Brain: Age-related atrophy. Mild periventricular white matter disease. No evidence of intracranial injury. No evidence of hemorrhage, mass, cortical infarct or hydrocephalus. Vascular: Vascular calcifications. Skull: Intact and unremarkable. Mild right parietal scalp soft tissue swelling. Sinuses/Orbits: Clear paranasal sinuses.  Normal orbits. Other: None. ASPECTS [facility] Stroke Program Early CT Score) - Ganglionic level infarction (caudate, lentiform nuclei, internal capsule, insula, M1-M3 cortex): 7. - Supraganglionic infarction (M4-M6 cortex): 3. Total score (0-10 with 10 being normal): 10. IMPRESSION: 1. Age-related atrophy.  No apparent acute process. 2. ASPECTS is 10. 3.  These results were called by telephone at the time of interpretation on 03/09/2024 at 1:55 pm to provider Mr. Wonda [place], who verbally acknowledged these results. Electronically Signed   By: Evalene Coho M.D.   On: 03/09/2024 13:57    Assessment and Plan  Acute ataxia CT head negative for acute intracranial abnormality.  CTA head and neck negative and normal CT perfusion study.  He does have focal ataxia on exam and neurology suspects that he has had a small ischemic stroke.  Admitted for stroke workup. -Appreciate neurology recommendations -Continue aspirin  81 mg daily and Plavix  75 mg daily -Hemoglobin A1c, fasting lipid panel -MRI of the brain without contrast -Frequent neurochecks -Echocardiogram -Risk factor modification -Telemetry monitoring -PT/OT/SLP eval  RLS Continue Requip .  Anxiety/depression/ADHD/ADD Continue home medications.  Chronic pain/neuropathy Continue gabapentin .  DVT prophylaxis: Lovenox  Code Status: Full Code (discussed with the patient) Family Communication: Patient's husband at bedside. Consults called: Neurology Level of care: Telemetry bed Admission status: It is my clinical opinion that referral for OBSERVATION is reasonable and necessary in this patient based on the above information provided. The aforementioned taken together are felt to place the patient at high risk for further clinical deterioration. However, it is anticipated that the patient may be medically stable for discharge from the hospital within 24 to 48 hours.  Editha Ram MD [facility]  If 7PM-7AM, please contact night-coverage [email or web address]  03/09/2024, 8:48 PM

## 2024-03-10 ENCOUNTER — Observation Stay (HOSPITAL_COMMUNITY)

## 2024-03-10 DIAGNOSIS — E785 Hyperlipidemia, unspecified: Secondary | ICD-10-CM

## 2024-03-10 DIAGNOSIS — I361 Nonrheumatic tricuspid (valve) insufficiency: Secondary | ICD-10-CM

## 2024-03-10 DIAGNOSIS — I351 Nonrheumatic aortic (valve) insufficiency: Secondary | ICD-10-CM

## 2024-03-10 DIAGNOSIS — H8190 Unspecified disorder of vestibular function, unspecified ear: Secondary | ICD-10-CM

## 2024-03-10 DIAGNOSIS — R270 Ataxia, unspecified: Secondary | ICD-10-CM | POA: Diagnosis not present

## 2024-03-10 LAB — ECHOCARDIOGRAM COMPLETE
Area-P 1/2: 5.25 cm2
Calc EF: 62.5 %
Height: 72 in
S' Lateral: 2.4 cm
Single Plane A2C EF: 59.6 %
Single Plane A4C EF: 63.7 %
Weight: 3051.17 [oz_av]

## 2024-03-10 LAB — LIPID PANEL
Cholesterol: 166 mg/dL (ref 0–200)
HDL: 32 mg/dL — ABNORMAL LOW (ref >40)
LDL Cholesterol: 76 mg/dL (ref 0–99)
Total CHOL/HDL Ratio: 5.2 ratio
Triglycerides: 289 mg/dL — ABNORMAL HIGH (ref <150)
VLDL: 58 mg/dL — ABNORMAL HIGH (ref 0–40)

## 2024-03-10 LAB — HEMOGLOBIN A1C
Hgb A1c MFr Bld: 5 % (ref 4.8–5.6)
Mean Plasma Glucose: 96.8 mg/dL

## 2024-03-10 LAB — HIV ANTIBODY (ROUTINE TESTING W REFLEX): HIV Screen 4th Generation wRfx: NONREACTIVE

## 2024-03-10 MED ORDER — ROSUVASTATIN CALCIUM 5 MG PO TABS
5.0000 mg | ORAL_TABLET | Freq: Every day | ORAL | Status: DC
  Administered 2024-03-10: 5 mg via ORAL
  Filled 2024-03-10 (×2): qty 1

## 2024-03-10 MED ORDER — BUPROPION HCL ER (SR) 100 MG PO TB12
100.0000 mg | ORAL_TABLET | Freq: Every day | ORAL | Status: DC
  Administered 2024-03-10: 100 mg via ORAL
  Filled 2024-03-10: qty 1

## 2024-03-10 MED ORDER — BUPROPION HCL ER (SR) 100 MG PO TB12: 200.0000 mg | ORAL_TABLET | Freq: Every day | ORAL | Status: DC

## 2024-03-10 NOTE — TOC CAGE-AID Note (Signed)
 Transition of Care [facility]) - CAGE-AID Screening   Patient Details  Name: Parker Silva MRN: [id number] Date of Birth: 1956/07/28  Transition of Care [facility]) CM/SW Contact:    Parker Silva E Parker Okane, LCSW Phone Number: 03/10/2024, 9:43 AM   Clinical Narrative:    CAGE-AID Screening:    Have You Ever Felt You Ought to Cut Down on Your Drinking or Drug Use?: No Have People Annoyed You By [employer] Your Drinking Or Drug Use?: No Have You Felt Bad Or Guilty About Your Drinking Or Drug Use?: No Have You Ever Had a Drink or Used Drugs First Thing In The Morning to Steady Your Nerves or to Get Rid of a Hangover?: No CAGE-AID Score: 0  Substance Abuse Education Offered: No

## 2024-03-10 NOTE — Care Management Obs Status (Cosign Needed)
 MEDICARE OBSERVATION STATUS NOTIFICATION   Patient Details  Name: Parker Silva MRN: [id number] Date of Birth: Mar 26, 1956   Medicare Observation Status Notification Given:  Yes    [name], RN 03/10/2024, 10:21 AM

## 2024-03-10 NOTE — Progress Notes (Addendum)
 STROKE TEAM PROGRESS NOTE    SIGNIFICANT HOSPITAL EVENTS  Seen in [place] via teleneurology at [facility] due to worsening dizziness and multiple falls.  CT/CTA negative.  MRI negative.  INTERIM HISTORY/SUBJECTIVE  Spouse at bedside. On exam, no neurological deficits are seen.  Patient does complain continue to complain of off-balance feeling, feeling of fluid in ears. Denies ringing in ears. Denies room spinning feeling.  Fukoda step test attempted in room, patient unsteady and c/o right ankle pain which he states is from when he fell.   Recommended to continue on aspirin  alone.  Recommend vestibular evaluation by PT.   OBJECTIVE  CBC    Component Value Date/Time   WBC 6.8 03/09/2024 1345   RBC 5.65 03/09/2024 1345   HGB 16.7 03/09/2024 1345   HGB 15.4 02/18/2023 1559   HCT 48.2 03/09/2024 1345   HCT 45.1 02/18/2023 1559   PLT 240 03/09/2024 1345   PLT 242 02/18/2023 1559   MCV 85.3 03/09/2024 1345   MCV 87 02/18/2023 1559   MCH 29.6 03/09/2024 1345   MCHC 34.6 03/09/2024 1345   RDW 11.5 03/09/2024 1345   RDW 11.8 02/18/2023 1559   LYMPHSABS 2.1 03/09/2024 1345   LYMPHSABS 2.5 02/18/2023 1559   MONOABS 0.7 03/09/2024 1345   EOSABS 0.1 03/09/2024 1345   EOSABS 0.1 02/18/2023 1559   BASOSABS 0.0 03/09/2024 1345   BASOSABS 0.0 02/18/2023 1559    BMET    Component Value Date/Time   NA 137 03/09/2024 1345   NA 138 02/18/2023 1559   K 4.3 03/09/2024 1345   CL 101 03/09/2024 1345   CO2 22 03/09/2024 1345   GLUCOSE 100 (H) 03/09/2024 1345   BUN 17 03/09/2024 1345   BUN 13 02/18/2023 1559   CREATININE 1.27 (H) 03/09/2024 1345   CREATININE 1.26 11/13/2016 1346   CALCIUM 10.5 (H) 03/09/2024 1345   EGFR 74 02/18/2023 1559   GFRNONAA >60 03/09/2024 1345    IMAGING past 24 hours ECHOCARDIOGRAM COMPLETE Result Date: 03/10/2024    ECHOCARDIOGRAM REPORT   Patient Name:   Parker Silva [facility] Date of Exam: 03/10/2024 Medical Rec #:  [id number]        Height:        72.0 in Accession #:    [id number]       Weight:       190.7 lb Date of Birth:  01-04-1956        BSA:          2.088 m Patient Age:    68 years         BP:           120/82 mmHg Patient Gender: M                HR:           70 bpm. Exam Location:  Inpatient Procedure: 2D Echo, Cardiac Doppler and Color Doppler (Both Spectral and Color            Flow Doppler were utilized during procedure). Indications:   stroke  History:       Patient has no prior history of Echocardiogram examinations.                Stroke.  Sonographer:   Vella Key Referring      [phone] EDITHA RAM Phys: IMPRESSIONS  1. Left ventricular ejection fraction, by estimation, is 65 to 70%. The left ventricle has normal function. The left ventricle has no regional  wall motion abnormalities. Left ventricular diastolic parameters were normal.  2. Right ventricular systolic function is normal. The right ventricular size is normal.  3. The mitral valve is normal in structure. Trivial mitral valve regurgitation.  4. The aortic valve is tricuspid. Aortic valve regurgitation is mild.  5. The inferior vena cava is dilated in size with >50% respiratory variability, suggesting right atrial pressure of 8 mmHg. FINDINGS  Left Ventricle: Left ventricular ejection fraction, by estimation, is 65 to 70%. The left ventricle has normal function. The left ventricle has no regional wall motion abnormalities. The left ventricular internal cavity size was normal in size. There is  no left ventricular hypertrophy. Left ventricular diastolic parameters were normal. Right Ventricle: The right ventricular size is normal. Right vetricular wall thickness was not assessed. Right ventricular systolic function is normal. Left Atrium: Left atrial size was normal in size. Right Atrium: Right atrial size was normal in size. Pericardium: There is no evidence of pericardial effusion. Mitral Valve: The mitral valve is normal in structure. Trivial mitral valve regurgitation.  Tricuspid Valve: The tricuspid valve is normal in structure. Tricuspid valve regurgitation is mild. Aortic Valve: The aortic valve is tricuspid. Aortic valve regurgitation is mild. Pulmonic Valve: The pulmonic valve was normal in structure. Pulmonic valve regurgitation is trivial. Aorta: The aortic root and ascending aorta are structurally normal, with no evidence of dilitation. Venous: The inferior vena cava is dilated in size with greater than 50% respiratory variability, suggesting right atrial pressure of 8 mmHg. IAS/Shunts: No atrial level shunt detected by color flow Doppler.  LEFT VENTRICLE PLAX 2D LVIDd:         4.00 cm     Diastology LVIDs:         2.40 cm     LV e' medial:    8.38 cm/s LV PW:         1.00 cm     LV E/e' medial:  8.3 LV IVS:        1.00 cm     LV e' lateral:   11.10 cm/s LVOT diam:     1.50 cm     LV E/e' lateral: 6.2 LV SV:         37 LV SV Index:   18 LVOT Area:     1.77 cm  LV Volumes (MOD) LV vol d, MOD A2C: 72.2 ml LV vol d, MOD A4C: 93.7 ml LV vol s, MOD A2C: 29.2 ml LV vol s, MOD A4C: 34.0 ml LV SV MOD A2C:     43.0 ml LV SV MOD A4C:     93.7 ml LV SV MOD BP:      53.3 ml RIGHT VENTRICLE RV Basal diam:  3.00 cm RV S prime:     14.70 cm/s TAPSE (M-mode): 2.5 cm LEFT ATRIUM             Index        RIGHT ATRIUM           Index LA diam:        3.30 cm 1.58 cm/m   RA Area:     14.90 cm LA Vol (A2C):   34.6 ml 16.57 ml/m  RA Volume:   36.20 ml  17.34 ml/m LA Vol (A4C):   17.4 ml 8.33 ml/m LA Biplane Vol: 25.9 ml 12.40 ml/m  AORTIC VALVE LVOT Vmax:   105.00 cm/s LVOT Vmean:  74.800 cm/s LVOT VTI:    0.207 m  AORTA Ao Root diam:  3.40 cm Ao Asc diam:  3.70 cm MITRAL VALVE MV Area (PHT): 5.25 cm    SHUNTS MV Decel Time: 145 msec    Systemic VTI:  0.21 m MV E velocity: 69.15 cm/s  Systemic Diam: 1.50 cm MV A velocity: 84.40 cm/s MV E/A ratio:  0.82 Vina Gull MD Electronically signed by Vina Gull MD Signature Date/Time: 03/10/2024/12:57:19 PM    Final    MR BRAIN WO CONTRAST Result  Date: 03/10/2024 CLINICAL DATA:  Neuro deficit, acute, stroke suspected EXAM: MRI HEAD WITHOUT CONTRAST TECHNIQUE: Multiplanar, multiecho pulse sequences of the brain and surrounding structures were obtained without intravenous contrast. COMPARISON:  CT head from earlier today. FINDINGS: Brain: No acute infarction, hemorrhage, hydrocephalus, extra-axial collection or mass lesion. Vascular: Major arterial flow voids are maintained at the skull base. Skull and upper cervical spine: Normal marrow signal. Sinuses/Orbits: Negative. Other: No mastoid effusions. IMPRESSION: No evidence of acute intracranial abnormality. Electronically Signed   By: Gilmore GORMAN Molt M.D.   On: 03/10/2024 00:26    Vitals:   03/10/24 0501 03/10/24 0758 03/10/24 1226 03/10/24 1706  BP: 120/82 125/87 131/79 (!) 144/89  Pulse: 74 71 82 76  Resp: 18 17 17 18   Temp: 97.7 F (36.5 C) 97.9 F (36.6 C) 98 F (36.7 C) 97.6 F (36.4 C)  TempSrc:  Oral    SpO2: 97% 98% 97% 96%  Weight:      Height:         PHYSICAL EXAM General:  Alert, well-nourished, well-developed patient in no acute distress CV: Regular rate and rhythm on monitor Respiratory:  Regular, unlabored respirations on room air  NEURO:  Mental Status: AA&Ox3, patient is able to give clear and coherent history Speech/Language: speech is without dysarthria or aphasia.  Naming, repetition, fluency, and comprehension intact.  Cranial Nerves:  II: PERRL. Visual fields full.  III, IV, VI: EOMI. Eyelids elevate symmetrically.  V: Sensation is intact to light touch and symmetrical to face.  VII: Face is symmetrical resting and smiling VIII: hearing intact to voice. States possible decrease recently.  IX, X: Palate elevates symmetrically. Phonation is normal.  KP:Dynloizm shrug 5/5. XII: tongue is midline without fasciculations. Motor: 5/5 strength to all muscle groups tested.  Tone: is normal and bulk is normal Sensation- Intact to light touch bilaterally.  Extinction absent to light touch to DSS.   Coordination: FTN intact bilaterally, HKS: no ataxia in BLE.No drift.  Gait- unsteady, leaning to left if turns head to the right.   Most Recent NIH: 0.    ASSESSMENT/PLAN  Mr. Parker Silva is a 68 y.o. male with history of migraines, arthritis, low back pain who was admitted for stroke workup after presenting with persistent dizziness, seen as a code stroke.  NIH on Admission: 1 for ataxia  Vestibular dysfunction Code Stroke CT head No acute abnormality. Atrophy. ASPECTS 10.    CTA head & neck w/perfusion:  Negative CT angiogram of neck and head Normal CT perfusion MRI: Negative 2D Echo: EF 65 to 70%, trivial MVR, dilated inferior vena cava cava with greater than 50% respiratory variability LDL 76 HgbA1c 5.0 VTE prophylaxis - lovenox  No antithrombotic prior to admission, now on aspirin  81 mg daily  Therapy recommendations:  Outpatient PT/OT/ST Disposition:  home  Hypertension Home meds:  no history Stable BP Goal: normotensive  Hyperlipidemia Home meds:  none LDL 76, goal < 70 Add Crestor 5mg   Continue statin at discharge  Other Stroke Risk Factors Migraines  Other Active Problems  Hx of Anxiety  Hospital day # 0   Pt seen by Neuro NP/APP with MD. Note/plan to be edited by MD as needed.    Rocky JAYSON Likes, DNP, AGACNP-BC [facility] Please use AMION for contact information & EPIC for messaging.  I have personally obtained history,examined this patient, reviewed notes, independently viewed imaging studies, participated in medical decision making and plan of care.ROS completed by me personally and pertinent positives fully documented  I have made any additions or clarifications directly to the above note. Agree with note above.  Patient with longstanding history of tinnitus and some decreased hearing presented with sudden onset of dizziness and unsteadiness.  He denied true vertigo or other focal neurological  symptoms.  Neurological exam suggested or mild peripheral vestibular dysfunction.  Neurovascular imaging was unremarkable.  Recommend aspirin  alone for stroke prevention.  Physical therapy for vestibular rehab exercises.  Follow-up as an outpatient with ENT for evaluation.  From discussion with patient and friend at the bedside and answered questions.   I personally spent a total of 50 minutes in the care of the patient today including getting/reviewing separately obtained history, performing a medically appropriate exam/evaluation, counseling and educating, placing orders, referring and communicating with other health care professionals, documenting clinical information in the EHR, independently interpreting results, and coordinating care.         Eather Popp, MD Medical Director [facility] Pager: [phone] 03/10/2024 5:34 PM   To contact Stroke Continuity provider, please refer to [email or web address]. After hours, contact General Neurology

## 2024-03-10 NOTE — Evaluation (Signed)
 Occupational Therapy Evaluation Patient Details Name: Parker Silva MRN: [id number] DOB: 1955-09-29 Today's Date: 03/10/2024   History of Present Illness   Pt is a 68 y.o. male who presented 03/09/24 with difficulty ambulating, L-sided clumsiness, and dizziness for x2-3 days. MRI brain negative. CTA of head and neck showed no LVO. PMH: ADHD, arthritis, dyslexia     Clinical Impressions Pt resting comfortably in bed, partner in room during session. Pt lives at home with partner, they both work during the day, PLOF independent. Pt currently close to baseline, mod I for ADLs around room, ambulates down hall without assist, mild instability with turns but self recovers well, reports this is his baseline. Pt educated on safety awareness and exercises to improve strength/stability in BLEs. Pt c/o trigger finger to R 3rd digit, mild and does not prevent Pt from completing functional tasks, plans to see OPPT and will bring it up with them. Pt at this time has no acute OT needs, no OT follow up needs.      If plan is discharge home, recommend the following:         Functional Status Assessment   Patient has had a recent decline in their functional status and demonstrates the ability to make significant improvements in function in a reasonable and predictable amount of time.     Equipment Recommendations   None recommended by OT     Recommendations for Other Services         Precautions/Restrictions   Precautions Precautions: Fall Restrictions Weight Bearing Restrictions Per Provider Order: No     Mobility Bed Mobility Overal bed mobility: Modified Independent                  Transfers Overall transfer level: Independent                        Balance Overall balance assessment: Mild deficits observed, not formally tested                                         ADL either performed or assessed with clinical judgement   ADL  Overall ADL's : Modified independent                                             Vision Baseline Vision/History: 0 No visual deficits Ability to See in Adequate Light: 0 Adequate Patient Visual Report: No change from baseline       Perception         Praxis         Pertinent Vitals/Pain Pain Assessment Pain Assessment: No/denies pain     Extremity/Trunk Assessment Upper Extremity Assessment Upper Extremity Assessment: Overall WFL for tasks assessed;RUE deficits/detail RUE Deficits / Details: 3rd digit trigger finger RUE Sensation: WNL RUE Coordination: WNL   Lower Extremity Assessment Lower Extremity Assessment: Defer to PT evaluation RLE Deficits / Details: MMT scores of >/= 4+/5 bil; denied numbness/tingling; delayed dynamic proprioception in ankles bil, L>R, intact at knees; coordination grossly intact bil RLE Sensation: decreased proprioception RLE Coordination: WNL LLE Deficits / Details: MMT scores of >/= 4+/5 bil; denied numbness/tingling; delayed dynamic proprioception in ankles bil, L>R, intact at knees; coordination grossly intact bil LLE Sensation: decreased proprioception LLE Coordination: WNL  Cervical / Trunk Assessment Cervical / Trunk Assessment: Normal   Communication Communication Communication: No apparent difficulties   Cognition Arousal: Alert Behavior During Therapy: WFL for tasks assessed/performed Cognition: No apparent impairments                               Following commands: Intact       Cueing  General Comments   Cueing Techniques: Verbal cues  Able to slowly with control and safety kneel down to one knee then assume tall kneeling bil then stand back up without assistance or LOB - 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    OT Problem List: Decreased strength;Impaired balance (sitting and/or standing)   OT Treatment/Interventions:        OT Goals(Current goals can be found in the care plan section)   Acute Rehab OT Goals Patient Stated Goal: to return home OT Goal Formulation: With patient Time For Goal Achievement: 03/24/24 Potential to Achieve Goals: Good   OT Frequency:       Co-evaluation              AM-PAC OT 6 Clicks Daily Activity     Outcome Measure Help from another person eating meals?: None Help from another person taking care of personal grooming?: None Help from another person toileting, which includes using toliet, bedpan, or urinal?: None Help from another person bathing (including washing, rinsing, drying)?: None Help from another person to put on and taking off regular upper body clothing?: None Help from another person to put on and taking off regular lower body clothing?: None 6 Click Score: 24   End of Session Nurse Communication: Mobility status  Activity Tolerance: Patient tolerated treatment well Patient left: in bed;with call bell/phone within reach;with family/visitor present  OT Visit Diagnosis: Other abnormalities of gait and mobility (R26.89);Repeated falls (R29.6);Muscle weakness (generalized) (M62.81)                Time:  8859-8840 OT Time Calculation (min): 19 min Charges:  OT General Charges $OT Visit: 1 Visit OT Evaluation $OT Eval Low Complexity: 1 Low  [address], OTR/L   Parker Silva 03/10/2024, 12:46 PM

## 2024-03-10 NOTE — Evaluation (Signed)
 Physical Therapy Evaluation Patient Details Name: Parker Silva MRN: [id number] DOB: 1955/11/20 Today's Date: 03/10/2024  History of Present Illness  Pt is a 68 y.o. male who presented 03/09/24 with difficulty ambulating, L-sided clumsiness, and dizziness for x2-3 days. MRI brain negative. CTA of head and neck showed no LVO. PMH: ADHD, arthritis, dyslexia  Clinical Impression  Pt presents with condition above and deficits mentioned below, see PT Problem List. 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 He reports he has been veering to the L when ambulating and intermittently to the R when driving lately. See Vestibular Assessment below in regards to assessing his dizziness, specifically General Observation top section. Unable to reproduce his dizziness this session. He currently demonstrates delayed bil ankle dynamic proprioception, L>R, which could be impacting his balance and contributing to some of his recent falls. He scored 17/24 on the DGI today, suggestive of balance deficits and being at risk for falls. He did stagger 1x but recovered on his own with reactional step strategy. Currently, he is able to perform bed mobility and transfers without assistance and ambulate without AD at a CGA-supervision level. He could benefit from follow-up with OPPT to address his deficits in proprioception and balance to reduce his risk for subsequent falls. Will continue to follow acutely.    Vestibular Assessment - 03/10/24 0001       Vestibular Assessment   General Observation The pt's dizziness does not appear related to BPPV or orthostatic BP. No direction changing nystagmus noted either to suggest a central cause, and his brain MRI was negative. He does report his dizziness worsening after increasing his wellbutrin  dosage last week, so it could be related to his medication? Notified MD.      Symptom Behavior   Subjective  history of current problem Pt reports dizziness began a few days ago and was the worst yesterday, reporting it felt like being pushed forward from behind. He reports he fell a few times. The most severe dizziness began while working outside. He reports he was drinking a lot of water, but no electrolyte drinks. He reports it worsened when he would bend down and stand back up or carry anything anterior to him. He reports it got better with sitting, resting, and hydrating. He has a hx of black spots in his vision at baseline. He denies spinning or vertigo dizziness this week, but reports experiencing it a couple times in the past (a few months ago at least). He does report he just increased his wellbutrin  dosage from 100 to 400 (really only taking 300, 200 in AM and 100 in PM though) a week ago and the dizziness began not long after that. He also endorses his balance worsens when he feels congested in his sinuses and that he has not used his netty pot recently and thinks he needs to.    Type of Dizziness  Imbalance;Comment   being pushed forward   Frequency of Dizziness intermittent    Duration of Dizziness unsure    Symptom Nature Motion provoked;Positional;Intermittent    Aggravating Factors Comment;Forward bending   standing up from bending down; carrying something anterior to him   Relieving Factors Rest;Comments   sitting, hydrating   Progression of Symptoms Better   today, worse yesterday   History of similar episodes intermittent brief episodes in past few months      Oculomotor Exam   Oculomotor Alignment Normal  Ocular ROM WFL    Spontaneous Absent    Smooth Pursuits Intact    Saccades Intact    Comment wears glasses      Oculomotor Exam-Fixation Suppressed    Ocular Alignment Normal    Ocular ROM WFL    Left Head Impulse negative    Right Head Impulse negative      Visual Acuity   Static wears glasses      Positional Testing   Dix-Hallpike Dix-Hallpike Right;Dix-Hallpike Left     Horizontal Canal Testing Horizontal Canal Right;Horizontal Canal Left      Dix-Hallpike Right   Dix-Hallpike Right Duration negative    Dix-Hallpike Right Symptoms No nystagmus      Dix-Hallpike Left   Dix-Hallpike Left Duration negative for nystagmus and dizziness, appeared to have increased black spots in vision but later denied it    Dix-Hallpike Left Symptoms No nystagmus      Horizontal Canal Right   Horizontal Canal Right Duration negative    Horizontal Canal Right Symptoms Normal      Horizontal Canal Left   Horizontal Canal Left Duration negative    Horizontal Canal Left Symptoms Normal      Orthostatics   BP supine (x 5 minutes) 126/86    HR supine (x 5 minutes) 72    BP sitting 126/83    HR sitting 73    BP standing (after 1 minute) 135/94    HR standing (after 1 minute) 84    Orthostatics Comment denied symptoms, orthostatics negative                 If plan is discharge home, recommend the following: Assist for transportation;Help with stairs or ramp for entrance;Assistance with cooking/housework   Can travel by private vehicle        Equipment Recommendations None recommended by PT  Recommendations for Other Services       Functional Status Assessment Patient has had a recent decline in their functional status and demonstrates the ability to make significant improvements in function in a reasonable and predictable amount of time.     Precautions / Restrictions Precautions Precautions: Fall (low risk) Restrictions Weight Bearing Restrictions Per Provider Order: No      Mobility  Bed Mobility Overal bed mobility: Modified Independent             General bed mobility comments: Pt able to perform all bed mobility aspects without assistance. Used rails intermittenlty, HOB elevated    Transfers Overall transfer level: Independent Equipment used: None               General transfer comment: No LOB standing from EOB     Ambulation/Gait Ambulation/Gait assistance: Supervision, Contact guard assist Gait Distance (Feet): 325 Feet Assistive device: None Gait Pattern/deviations: Step-through pattern, Drifts right/left Gait velocity: WFL     General Gait Details: Pt ambulates without UE support with a step-through gait pattern. Pt intermittently would swing his L leg mildly laterally and would veer to the L at at times, which he was aware of. When cued to change head positions he slowed his gait and had x1 LOB laterally to the R when looking R, taking a reactional step to recover his balance on his own, CGA for safety. Able to change speeds and directions and navigate around obstacles without LOB or gait deviations. Slows down to step over obstacles. Supervision with intermittent CGA during dynamic challenges for safety only.  Stairs  Wheelchair Mobility     Tilt Bed    Modified Rankin (Stroke Patients Only) Modified Rankin (Stroke Patients Only) Pre-Morbid Rankin Score: No symptoms Modified Rankin: No significant disability     Balance Overall balance assessment: Mild deficits observed, not formally tested                               Standardized Balance Assessment Standardized Balance Assessment : Dynamic Gait Index   Dynamic Gait Index Level Surface: Mild Impairment Change in Gait Speed: Normal Gait with Horizontal Head Turns: Moderate Impairment Gait with Vertical Head Turns: Mild Impairment Gait and Pivot Turn: Mild Impairment Step Over Obstacle: Mild Impairment Step Around Obstacles: Normal Steps: Mild Impairment (assumed, not tested) Total Score: 17       Pertinent Vitals/Pain Pain Assessment Pain Assessment: Faces Faces Pain Scale: Hurts a little bit Pain Location: chronic back and neck pain Pain Descriptors / Indicators: Discomfort, Grimacing, Guarding Pain Intervention(s): Limited activity within patient's tolerance, Monitored during session,  Repositioned    Home Living Family/patient expects to be discharged to:: Private residence Living Arrangements: Spouse/significant other Available Help at Discharge: Family;Available 24 hours/day Type of Home: House Home Access: Stairs to enter Entrance Stairs-Rails: None Entrance Stairs-Number of Steps: 1   Home Layout: One level Home Equipment: None Additional Comments: has x2 dogs    Prior Function Prior Level of Function : Independent/Modified Independent;Driving;Working/employed;History of Falls (last six months)             Mobility Comments: No AD; several recent falls 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     Extremity/Trunk Assessment   Upper Extremity Assessment Upper Extremity Assessment: Overall WFL for tasks assessed (denied numbness/tingling bil)    Lower Extremity Assessment Lower Extremity Assessment: RLE deficits/detail;LLE deficits/detail RLE Deficits / Details: MMT scores of >/= 4+/5 bil; denied numbness/tingling; delayed dynamic proprioception in ankles bil, L>R, intact at knees; coordination grossly intact bil RLE Sensation: decreased proprioception RLE Coordination: WNL LLE Deficits / Details: MMT scores of >/= 4+/5 bil; denied numbness/tingling; delayed dynamic proprioception in ankles bil, L>R, intact at knees; coordination grossly intact bil LLE Sensation: decreased proprioception LLE Coordination: WNL    Cervical / Trunk Assessment Cervical / Trunk Assessment: Normal  Communication   Communication Communication: No apparent difficulties    Cognition Arousal: Alert Behavior During Therapy: WFL for tasks assessed/performed   PT - Cognitive impairments: No apparent impairments                       PT - Cognition Comments: Pt's partner reports pt has had some memory deficits and changes in personality (like watching instagram more rather than being creative as he is usually very creative) in the past month. 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    Exercises     Assessment/Plan    PT Assessment Patient needs continued PT services  PT Problem List Decreased balance;Decreased mobility       PT Treatment Interventions DME instruction;Gait training;Stair training;Functional mobility training;Therapeutic activities;Therapeutic exercise;Balance training;Neuromuscular re-education;Patient/family education    PT Goals (Current goals can be found in the Care Plan section)  Acute Rehab PT Goals Patient Stated Goal: to not be dizzy PT Goal Formulation: With patient/family Time For Goal Achievement: 03/24/24 Potential to Achieve Goals: Good    Frequency Min 1X/week     Co-evaluation               AM-PAC PT 6 Clicks Mobility  Outcome Measure Help needed turning from your back to your side while in a flat bed without using bedrails?: None Help needed moving from lying on your back to sitting on the side of a flat bed without using bedrails?: None Help needed moving to and from a bed to a chair (including a wheelchair)?: None Help needed standing up from a chair using your arms (e.g., wheelchair or bedside chair)?: None Help needed to walk in hospital room?: A Little Help needed climbing 3-5 steps with a railing? : A Little 6 Click Score: 22    End of Session   Activity Tolerance: Patient tolerated treatment well Patient left: in bed;with call bell/phone within reach;with family/visitor present (encouraged pt to have partner with him when going to bathroom and not to get up if becomes symptomatic for his safety, he verbalized understanding) Nurse Communication: Mobility status PT Visit Diagnosis: Unsteadiness on feet (R26.81);Other  abnormalities of gait and mobility (R26.89);Difficulty in walking, not elsewhere classified (R26.2);Dizziness and giddiness (R42)    Time: 0906-1000 PT Time Calculation (min) (ACUTE ONLY): 54 min   Charges:   PT Evaluation $PT Eval Low Complexity: 1 Low PT Treatments $Gait Training: 8-22 mins $Therapeutic Activity: 23-37 mins PT General Charges $$ ACUTE PT VISIT: 1 Visit         Theo Ferretti, PT, DPT Acute Rehabilitation Services  Office: [phone]   Theo CHRISTELLA Ferretti 03/10/2024, 10:42 AM

## 2024-03-10 NOTE — Plan of Care (Signed)

## 2024-03-10 NOTE — Progress Notes (Signed)
 SLP Cancellation Note  Patient Details Name: Parker Silva MRN: [id number] DOB: 1956-01-11   Cancelled treatment:       Reason Eval/Treat Not Completed: Patient at procedure or test/unavailable. Will continue efforts to complete SLE.  [name] B. Dory, MSP, CCC-SLP Speech Language Pathologist Office: [phone]  [name] 03/10/2024, 9:21 AM

## 2024-03-10 NOTE — Progress Notes (Signed)
 Transition of Care [facility]) - Inpatient Brief Assessment   Patient Details  Name: Parker Silva MRN: [id number] Date of Birth: January 25, 1956  Transition of [facility]) CM/SW Contact:    [name], RN Phone Number: 03/10/2024, 10:30 AM   Clinical Narrative: Patient admitted from home with Ataxia.  Patient lives with spouse at the home and plans to return home when medically stable.  Patient has no DME at the home.  Patient was seen by PT this morning and Outpatient therapy was recommended.  I spoke with the patient and provided Medicare choice and patient requested [facility] location.  Referral was placed and MD requested to co-sign the order.  Follow up placed in the AVS.  No other IP Care management needs at this time.  Patient should discharge home with spouse when medically stable for discharge.   Transition of Care Asessment: Insurance and Status: (P) Insurance coverage has been reviewed Patient has primary care physician: (P) Yes Home environment has been reviewed: (P) from home with spouse Prior level of function:: (P) Independent Prior/Current Home Services: (P) No current home services Social Drivers of Health Review: (P) SDOH reviewed interventions complete Readmission risk has been reviewed: (P) Yes Transition of care needs: (P) transition of care needs identified, TOC will continue to follow

## 2024-03-10 NOTE — Progress Notes (Signed)
 [facility] PROGRESS NOTE    Progress Note  Parker Silva  FMW:[id number] DOB: 1956/06/25 DOA: 03/09/2024 PCP: Parker Norleen [name], MD     Brief Narrative:   Parker Silva is an 68 y.o. male past medical history significant for last, [name] and depression, ADHD comes in with difficulty walking for the past 2 to 3 days, MRI of the brain showed no acute findings, CT of the head showed no acute findings CTA of head and neck showed no LVO he was loaded with aspirin  and Plavix .  Assessment/Plan:   Ataxia: Neurology was consulted. HgbA1c 5.0, fasting lipid panel HDL less than 40 LDL 76 MRI no acute CVA PT, OT, pending CTA of the head and neck showed no LVO Transthoracic Echo, pending Continue ASA 81mg  daily and plavix  75mg  daily  Continue high-dose statins. BP goal: permissive HTN upto 220/120 mmHg Telemetry monitoring  RLS: Continue Requip .  Anxiety/depression/ADHD: Continue current home regimen.  Chronic pain/neuropathy: Continue gabapentin .  DVT prophylaxis: lovenox  Family Communication:none Status is: Observation The patient remains OBS appropriate and will d/c before 2 midnights.    Code Status:     Code Status Orders  (From admission, onward)           Start     Ordered   03/09/24 2132  Full code  Continuous       Question:  By:  Answer:  Consent: discussion documented in EHR   03/09/24 2133           Code Status History     Date Active Date Inactive Code Status Order ID Comments User Context   04/08/2020 1220 04/09/2020 2000 Full Code [id number]  Parker Alm RAMAN, MD Inpatient         IV Access:   Peripheral IV   Procedures and diagnostic studies:   MR BRAIN WO CONTRAST Result Date: 03/10/2024 CLINICAL DATA:  Neuro deficit, acute, stroke suspected EXAM: MRI HEAD WITHOUT CONTRAST TECHNIQUE: Multiplanar, multiecho pulse sequences of the brain and surrounding structures were obtained without intravenous contrast. COMPARISON:  CT head  from earlier today. FINDINGS: Brain: No acute infarction, hemorrhage, hydrocephalus, extra-axial collection or mass lesion. Vascular: Major arterial flow voids are maintained at the skull base. Skull and upper cervical spine: Normal marrow signal. Sinuses/Orbits: Negative. Other: No mastoid effusions. IMPRESSION: No evidence of acute intracranial abnormality. Electronically Signed   By: Parker Silva Parker M.D.   On: 03/10/2024 00:26   CT ANGIO HEAD NECK W WO CM W PERF (CODE STROKE) Result Date: 03/09/2024 CLINICAL DATA:  Neuro deficit, acute, stroke suspected EXAM: CT ANGIOGRAPHY OF THE HEAD AND NECK CT PERFUSION BRAIN TECHNIQUE: Contiguous axial images were obtained from the base of the skull through the vertex without intravenous contrast. Multidetector CT imaging of the head and neck was performed using the standard protocol during bolus administration of intravenous contrast. Multiplanar CT image reconstructions and MIPs were obtained to evaluate the vascular anatomy. Carotid stenosis measurements (when applicable) are obtained utilizing NASCET criteria, using the distal internal carotid diameter as the denominator. Multiphase CT imaging of the brain was performed following IV bolus contrast injection. Subsequent parametric perfusion maps were calculated using RAPID software. RADIATION DOSE REDUCTION: This exam was performed according to the departmental dose-optimization program which includes automated exposure control, adjustment of the mA and/or kV according to patient size and/or use of iterative reconstruction technique. CONTRAST:  OMNIPAQUE  IOHEXOL  350 MG/ML SOLN COMPARISON:  CT of the head dated March 09, 2024. FINDINGS: Aortic arch: Normal in  caliber. Three vessel takeoff of the great arteries. The brachiocephalic artery is widely patent. Right carotid system: The common carotid and internal carotid arteries are normal in caliber and unremarkable in appearance. Left carotid system: The common  carotid and internal carotid arteries are normal in caliber and unremarkable in appearance. Vertebral arteries:The vertebral arteries are patent bilaterally. The left vertebral artery is dominant and the right vertebral artery is diminutive or hypoplastic. Skeleton: Status post bilateral posterolateral fusion of the cervical spine. Other neck: Negative. CTA HEAD Anterior circulation: The cranial and cavernous segments of the internal carotid arteries demonstrate no significant stenosis. The anterior and middle cerebral arteries and their branches are normal in caliber. No evidence of aneurysm, large vessel occlusion or flow-limiting stenosis. Posterior circulation: The vertebrobasilar system is unremarkable. The posterior cerebral arteries and cerebellar arteries are patent. Venous sinuses: Patent. Anatomic variants: None. Delayed phase: Unremarkable. CT Brain Perfusion Findings: ASPECTS: 10 CBF (<30%) Volume: 0mL Perfusion (Tmax>6.0s) volume: 0mL Mismatch Volume: 0mL Infarction Location:Not applicable. IMPRESSION: 1. Negative CT angiogram of the neck. 2. Negative CT angiogram of the head. 3. Normal CT perfusion of the head. These results were called by telephone at the time of interpretation on 03/09/2024 at 2:12 pm to provider Parker Silva , who verbally acknowledged these results. Electronically Signed   By: Parker Coho M.D.   On: 03/09/2024 14:15   CT HEAD CODE STROKE WO CONTRAST Result Date: 03/09/2024 CLINICAL DATA:  Code stroke. Unbalance with several falls and black spots in peripheral vision. Nausea and dizziness. EXAM: CT HEAD WITHOUT CONTRAST TECHNIQUE: Contiguous axial images were obtained from the base of the skull through the vertex without intravenous contrast. RADIATION DOSE REDUCTION: This exam was performed according to the departmental dose-optimization program which includes automated exposure control, adjustment of the mA and/or kV according to patient size and/or use of  iterative reconstruction technique. COMPARISON:  MRI of the head dated October 03, 2010. FINDINGS: Brain: Age-related atrophy. Mild periventricular white matter disease. No evidence of intracranial injury. No evidence of hemorrhage, mass, cortical infarct or hydrocephalus. Vascular: Vascular calcifications. Skull: Intact and unremarkable. Mild right parietal scalp soft tissue swelling. Sinuses/Orbits: Clear paranasal sinuses.  Normal orbits. Other: None. ASPECTS [facility] Stroke Program Early CT Score) - Ganglionic level infarction (caudate, lentiform nuclei, internal capsule, insula, M1-M3 cortex): 7. - Supraganglionic infarction (M4-M6 cortex): 3. Total score (0-10 with 10 being normal): 10. IMPRESSION: 1. Age-related atrophy.  No apparent acute process. 2. ASPECTS is 10. 3. These results were called by telephone at the time of interpretation on 03/09/2024 at 1:55 pm to provider Mr. Parker Silva, who verbally acknowledged these results. Electronically Signed   By: Parker Coho M.D.   On: 03/09/2024 13:57     Medical Consultants:   None.   Subjective:    Parker Silva no complaints  Objective:    Vitals:   03/09/24 1930 03/09/24 2025 03/10/24 0114 03/10/24 0501  BP: 132/79 (!) 142/95 127/78 120/82  Pulse:  76 77 74  Resp: (!) 21 18 18 18   Temp: 97.9 F (36.6 C) 98.1 F (36.7 C) 97.7 F (36.5 C) 97.7 F (36.5 C)  TempSrc: Oral Oral Oral   SpO2: 97% 98% 96% 97%  Weight:      Height:       SpO2: 97 %  No intake or output data in the 24 hours ending 03/10/24 0604 Filed Weights   03/09/24 1406  Weight: 86.5 kg    Exam: General exam: In no  acute distress. Respiratory system: Good air movement and clear to auscultation. Cardiovascular system: S1 & S2 heard, RRR. No JVD.  Gastrointestinal system: Abdomen is nondistended, soft and nontender.  Extremities: No pedal edema. Skin: No rashes, lesions or ulcers Psychiatry: Judgement and insight appear normal. Mood & affect  appropriate.    Data Reviewed:    Labs: Basic Metabolic Panel: Recent Labs  Lab 03/09/24 1345  NA 137  K 4.3  CL 101  CO2 22  GLUCOSE 100*  BUN 17  CREATININE 1.27*  CALCIUM 10.5*   GFR Estimated Creatinine Clearance: 62.8 mL/min (A) (by C-G formula based on SCr of 1.27 mg/dL (H)). Liver Function Tests: Recent Labs  Lab 03/09/24 1345  AST 25  ALT 26  ALKPHOS 61  BILITOT 0.8  PROT 7.5  ALBUMIN 5.0   No results for input(s): LIPASE, AMYLASE in the last 168 hours. No results for input(s): AMMONIA in the last 168 hours. Coagulation profile Recent Labs  Lab 03/09/24 1345  INR 1.1   COVID-19 Labs  No results for input(s): DDIMER, FERRITIN, LDH, CRP in the last 72 hours.  Lab Results  Component Value Date   SARSCOV2NAA NEGATIVE 04/05/2020   SARSCOV2NAA Not Detected 03/10/2019    CBC: Recent Labs  Lab 03/09/24 1345  WBC 6.8  NEUTROABS 3.9  HGB 16.7  HCT 48.2  MCV 85.3  PLT 240   Cardiac Enzymes: No results for input(s): CKTOTAL, CKMB, CKMBINDEX, TROPONINI in the last 168 hours. BNP (last 3 results) No results for input(s): PROBNP in the last 8760 hours. CBG: Recent Labs  Lab 03/09/24 1329  GLUCAP 108*   D-Dimer: No results for input(s): DDIMER in the last 72 hours. Hgb A1c: Recent Labs    03/10/24 0250  HGBA1C 5.0   Lipid Profile: Recent Labs    03/10/24 0250  CHOL 166  HDL 32*  LDLCALC 76  TRIG 710*  CHOLHDL 5.2   Thyroid  function studies: No results for input(s): TSH, T4TOTAL, T3FREE, THYROIDAB in the last 72 hours.  Invalid input(s): FREET3 Anemia work up: No results for input(s): VITAMINB12, FOLATE, FERRITIN, TIBC, IRON, RETICCTPCT in the last 72 hours. Sepsis Labs: Recent Labs  Lab 03/09/24 1345  WBC 6.8   Microbiology No results found for this or any previous visit (from the past 240 hours).   Medications:     stroke: early stages of recovery book   Does not apply  Once   aspirin  EC  81 mg Oral Daily   buPROPion  ER  100 mg Oral QHS   buPROPion  ER  200 mg Oral Daily   clopidogrel   75 mg Oral Daily   enoxaparin  (LOVENOX ) injection  40 mg Subcutaneous QHS   gabapentin   300 mg Oral QHS   methylphenidate   36 mg Oral Daily   rOPINIRole   0.75 mg Oral QHS   sodium chloride  flush  3 mL Intravenous Once   Continuous Infusions:    LOS: 0 days   Parker Silva  [facility]  03/10/2024, 6:04 AM

## 2024-03-11 DIAGNOSIS — R270 Ataxia, unspecified: Secondary | ICD-10-CM | POA: Diagnosis not present

## 2024-03-11 MED ORDER — ASPIRIN 81 MG PO TBEC: 81.0000 mg | DELAYED_RELEASE_TABLET | Freq: Every day | ORAL | 12 refills | Status: AC

## 2024-03-11 MED ORDER — ROSUVASTATIN CALCIUM 5 MG PO TABS: 5.0000 mg | ORAL_TABLET | Freq: Every day | ORAL | 0 refills | Status: DC

## 2024-03-11 NOTE — Progress Notes (Signed)
 Pt and spouse ambulated off unit.  Pt didn't want to wait on wheelchair.  Discharged home in stable condition with all belongings.

## 2024-03-11 NOTE — Progress Notes (Signed)
 Discharge instructions completed with pt and spouse.  Copy of instructions given to pt.  No questions at this time.

## 2024-03-11 NOTE — Plan of Care (Signed)
  Problem: Education: Goal: Knowledge of General Education information will improve Description: Including pain rating scale, medication(s)/side effects and non-pharmacologic comfort measures Outcome: Adequate for Discharge   Problem: Health Behavior/Discharge Planning: Goal: Ability to manage health-related needs will improve Outcome: Adequate for Discharge   Problem: Clinical Measurements: Goal: Ability to maintain clinical measurements within normal limits will improve Outcome: Adequate for Discharge Goal: Will remain free from infection Outcome: Adequate for Discharge Goal: Diagnostic test results will improve Outcome: Adequate for Discharge Goal: Respiratory complications will improve Outcome: Adequate for Discharge Goal: Cardiovascular complication will be avoided Outcome: Adequate for Discharge   Problem: Activity: Goal: Risk for activity intolerance will decrease Outcome: Adequate for Discharge   Problem: Nutrition: Goal: Adequate nutrition will be maintained Outcome: Adequate for Discharge   Problem: Coping: Goal: Level of anxiety will decrease Outcome: Adequate for Discharge   Problem: Elimination: Goal: Will not experience complications related to bowel motility Outcome: Adequate for Discharge Goal: Will not experience complications related to urinary retention Outcome: Adequate for Discharge   Problem: Pain Managment: Goal: General experience of comfort will improve and/or be controlled Outcome: Adequate for Discharge   Problem: Safety: Goal: Ability to remain free from injury will improve Outcome: Adequate for Discharge   Problem: Skin Integrity: Goal: Risk for impaired skin integrity will decrease Outcome: Adequate for Discharge   Problem: Education: Goal: Knowledge of disease or condition will improve Outcome: Adequate for Discharge Goal: Knowledge of secondary prevention will improve (MUST DOCUMENT ALL) Outcome: Adequate for Discharge Goal:  Knowledge of patient specific risk factors will improve (DELETE if not current risk factor) Outcome: Adequate for Discharge   Problem: Ischemic Stroke/TIA Tissue Perfusion: Goal: Complications of ischemic stroke/TIA will be minimized Outcome: Adequate for Discharge   Problem: Coping: Goal: Will verbalize positive feelings about self Outcome: Adequate for Discharge Goal: Will identify appropriate support needs Outcome: Adequate for Discharge   Problem: Health Behavior/Discharge Planning: Goal: Ability to manage health-related needs will improve Outcome: Adequate for Discharge Goal: Goals will be collaboratively established with patient/family Outcome: Adequate for Discharge   Problem: Self-Care: Goal: Ability to participate in self-care as condition permits will improve Outcome: Adequate for Discharge Goal: Verbalization of feelings and concerns over difficulty with self-care will improve Outcome: Adequate for Discharge Goal: Ability to communicate needs accurately will improve Outcome: Adequate for Discharge   Problem: Nutrition: Goal: Risk of aspiration will decrease Outcome: Adequate for Discharge Goal: Dietary intake will improve Outcome: Adequate for Discharge   Problem: Acute Rehab PT Goals(only PT should resolve) Goal: Pt Will Ambulate Outcome: Adequate for Discharge Goal: Pt Will Go Up/Down Stairs Outcome: Adequate for Discharge Goal: Pt/caregiver will Perform Home Exercise Program Outcome: Adequate for Discharge Goal: PT Additional Goal #1 Outcome: Adequate for Discharge

## 2024-03-11 NOTE — Progress Notes (Signed)
 Physical Therapy  Order received for Vestibular Evaluation. Please see thorough vestibular evaluation completed 7/18 as part of the overall PT evaluation.   Macario RAMAN, PT Acute Rehabilitation Services  Office [phone]

## 2024-03-11 NOTE — Plan of Care (Signed)

## 2024-03-11 NOTE — Discharge Summary (Signed)
 Physician Discharge Summary  LYNKIN SAINI FMW:[id number] DOB: July 30, 1956 DOA: 03/09/2024  PCP: Joyce Norleen [name], MD  Admit date: 03/09/2024 Discharge date: 03/11/2024  Admitted From: Home Disposition:  Home  Recommendations for Outpatient Follow-up:  Follow up with PCP in 1-2 weeks Please obtain BMP/CBC in one week   Home Health:No Equipment/Devices:None  Discharge Condition:Stable CODE STATUS:Full Diet recommendation: Heart Healthy   Brief/Interim Summary: 68 y.o. male past medical history significant for last, [name] and depression, ADHD comes in with difficulty walking for the past 2 to 3 days, MRI of the brain showed no acute findings, CT of the head showed no acute findings CTA of head and neck showed no LVO he was loaded with aspirin  and Plavix .   Discharge Diagnoses:  Principal Problem:   Ataxia Active Problems:   ADHD (attention deficit hyperactivity disorder)   RLS (restless legs syndrome)   Anxiety and depression   Chronic pain  Ataxia likely due to vestibular dysfunction: Neurology was consulted. HgbA1c 5.0, fasting lipid panel HDL less than 40 LDL 76 MRI no acute CVA PT, OT, evaluated the patient will need outpatient PT CTA of the head and neck showed no LVO Transthoracic Echo, pending Neurology recommended aspirin  81 mg. Continue statins. No events telemetry monitoring.  Symptoms now resolved.  RLS: Continue Requip .  Anxiety/depression/ADHD: No changes made to his medication.  Chronic pain syndrome: Continue gabapentin .  Discharge Instructions  Discharge Instructions     Ambulatory referral to Physical Therapy   Complete by: As directed    Iontophoresis - 4 mg/ml of dexamethasone : No   T.E.N.S. Unit Evaluation and Dispense as Indicated: No   Diet - low sodium heart healthy   Complete by: As directed    Increase activity slowly   Complete by: As directed       Allergies as of 03/11/2024   No Known Allergies      Medication List      TAKE these medications    aspirin  EC 81 MG tablet Take 1 tablet (81 mg total) by mouth daily. Swallow whole.   buPROPion  200 MG 12 hr tablet Commonly known as: [name] Take 1 tablet (200 mg total) by mouth 2 (two) times daily.   cyclobenzaprine  10 MG tablet Commonly known as: FLEXERIL  Take 1 tablet (10 mg total) by mouth at bedtime.   gabapentin  100 MG capsule Commonly known as: NEURONTIN  Take 3 capsules (300 mg total) by mouth at bedtime.   Magnesium 500 MG Tabs Take 500 mg by mouth daily.   methylphenidate  36 MG CR tablet Commonly known as: CONCERTA  Take 1 tablet (36 mg total) by mouth daily.   rOPINIRole  0.5 MG tablet Commonly known as: REQUIP  Take 1.5 tablets (0.75 mg total) by mouth at bedtime. Ok to take 1/2 tablet in the middle of night for breakthrough symptoms   rosuvastatin  5 MG tablet Commonly known as: CRESTOR  Take 1 tablet (5 mg total) by mouth daily.   sildenafil  20 MG tablet Commonly known as: REVATIO  Take up to 5 at one time as needed for erectile dysfunction   traZODone  150 MG tablet Commonly known as: DESYREL  TAKE 1 & 1/2 TABLETS AT BEDTIME AS NEEDED FOR SLEEP.        Follow-up Information     [facility] Follow up.   Specialty: Rehabilitation Why: Please call the OUtpatient therapy center and follow up regarding OUtpatient therapy needs. Contact information: [address] [place]  [phone]  No Known Allergies  Consultations: Neurology   Procedures/Studies: ECHOCARDIOGRAM COMPLETE Result Date: 03/10/2024    ECHOCARDIOGRAM REPORT   Patient Name:   RONELL DUFFUS [facility] Date of Exam: 03/10/2024 Medical Rec #:  [id number]        Height:       72.0 in Accession #:    [id number]       Weight:       190.7 lb Date of Birth:  [birth year]        BSA:          2.088 m Patient Age:    68 years         BP:           120/82 mmHg Patient  Gender: M                HR:           70 bpm. Exam Location:  Inpatient Procedure: 2D Echo, Cardiac Doppler and Color Doppler (Both Spectral and Color            Flow Doppler were utilized during procedure). Indications:   stroke  History:       Patient has no prior history of Echocardiogram examinations.                Stroke.  Sonographer:   Vella Key Referring      [phone] EDITHA RAM Phys: IMPRESSIONS  1. Left ventricular ejection fraction, by estimation, is 65 to 70%. The left ventricle has normal function. The left ventricle has no regional wall motion abnormalities. Left ventricular diastolic parameters were normal.  2. Right ventricular systolic function is normal. The right ventricular size is normal.  3. The mitral valve is normal in structure. Trivial mitral valve regurgitation.  4. The aortic valve is tricuspid. Aortic valve regurgitation is mild.  5. The inferior vena cava is dilated in size with >50% respiratory variability, suggesting right atrial pressure of 8 mmHg. FINDINGS  Left Ventricle: Left ventricular ejection fraction, by estimation, is 65 to 70%. The left ventricle has normal function. The left ventricle has no regional wall motion abnormalities. The left ventricular internal cavity size was normal in size. There is  no left ventricular hypertrophy. Left ventricular diastolic parameters were normal. Right Ventricle: The right ventricular size is normal. Right vetricular wall thickness was not assessed. Right ventricular systolic function is normal. Left Atrium: Left atrial size was normal in size. Right Atrium: Right atrial size was normal in size. Pericardium: There is no evidence of pericardial effusion. Mitral Valve: The mitral valve is normal in structure. Trivial mitral valve regurgitation. Tricuspid Valve: The tricuspid valve is normal in structure. Tricuspid valve regurgitation is mild. Aortic Valve: The aortic valve is tricuspid. Aortic valve regurgitation is mild. Pulmonic  Valve: The pulmonic valve was normal in structure. Pulmonic valve regurgitation is trivial. Aorta: The aortic root and ascending aorta are structurally normal, with no evidence of dilitation. Venous: The inferior vena cava is dilated in size with greater than 50% respiratory variability, suggesting right atrial pressure of 8 mmHg. IAS/Shunts: No atrial level shunt detected by color flow Doppler.  LEFT VENTRICLE PLAX 2D LVIDd:         4.00 cm     Diastology LVIDs:         2.40 cm     LV e' medial:    8.38 cm/s LV PW:         1.00 cm     LV E/e' medial:  8.3 LV IVS:        1.00 cm     LV e' lateral:   11.10 cm/s LVOT diam:     1.50 cm     LV E/e' lateral: 6.2 LV SV:         37 LV SV Index:   18 LVOT Area:     1.77 cm  LV Volumes (MOD) LV vol d, MOD A2C: 72.2 ml LV vol d, MOD A4C: 93.7 ml LV vol s, MOD A2C: 29.2 ml LV vol s, MOD A4C: 34.0 ml LV SV MOD A2C:     43.0 ml LV SV MOD A4C:     93.7 ml LV SV MOD BP:      53.3 ml RIGHT VENTRICLE RV Basal diam:  3.00 cm RV S prime:     14.70 cm/s TAPSE (M-mode): 2.5 cm LEFT ATRIUM             Index        RIGHT ATRIUM           Index LA diam:        3.30 cm 1.58 cm/m   RA Area:     14.90 cm LA Vol (A2C):   34.6 ml 16.57 ml/m  RA Volume:   36.20 ml  17.34 ml/m LA Vol (A4C):   17.4 ml 8.33 ml/m LA Biplane Vol: 25.9 ml 12.40 ml/m  AORTIC VALVE LVOT Vmax:   105.00 cm/s LVOT Vmean:  74.800 cm/s LVOT VTI:    0.207 m  AORTA Ao Root diam: 3.40 cm Ao Asc diam:  3.70 cm MITRAL VALVE MV Area (PHT): 5.25 cm    SHUNTS MV Decel Time: 145 msec    Systemic VTI:  0.21 m MV E velocity: 69.15 cm/s  Systemic Diam: 1.50 cm MV A velocity: 84.40 cm/s MV E/A ratio:  0.82 Vina Gull MD Electronically signed by Vina Gull MD Signature Date/Time: 03/10/2024/12:57:19 PM    Final    MR BRAIN WO CONTRAST Result Date: 03/10/2024 CLINICAL DATA:  Neuro deficit, acute, stroke suspected EXAM: MRI HEAD WITHOUT CONTRAST TECHNIQUE: Multiplanar, multiecho pulse sequences of the brain and surrounding  structures were obtained without intravenous contrast. COMPARISON:  CT head from earlier today. FINDINGS: Brain: No acute infarction, hemorrhage, hydrocephalus, extra-axial collection or mass lesion. Vascular: Major arterial flow voids are maintained at the skull base. Skull and upper cervical spine: Normal marrow signal. Sinuses/Orbits: Negative. Other: No mastoid effusions. IMPRESSION: No evidence of acute intracranial abnormality. Electronically Signed   By: Gilmore GORMAN Molt M.D.   On: 03/10/2024 00:26   CT ANGIO HEAD NECK W WO CM W PERF (CODE STROKE) Result Date: 03/09/2024 CLINICAL DATA:  Neuro deficit, acute, stroke suspected EXAM: CT ANGIOGRAPHY OF THE HEAD AND NECK CT PERFUSION BRAIN TECHNIQUE: Contiguous axial images were obtained from the base of the skull through the vertex without intravenous contrast. Multidetector CT imaging of the head and neck was performed using the standard protocol during bolus administration of intravenous contrast. Multiplanar CT image reconstructions and MIPs were obtained to evaluate the vascular anatomy. Carotid stenosis measurements (when applicable) are obtained utilizing NASCET criteria, using the distal internal carotid diameter as the denominator. Multiphase CT imaging of the brain was performed following IV bolus contrast injection. Subsequent parametric perfusion maps were calculated using RAPID software. RADIATION DOSE REDUCTION: This exam was performed according to the departmental dose-optimization program which includes automated exposure control, adjustment of the mA and/or kV according to patient size and/or use of iterative  reconstruction technique. CONTRAST:  OMNIPAQUE  IOHEXOL  350 MG/ML SOLN COMPARISON:  CT of the head dated March 09, 2024. FINDINGS: Aortic arch: Normal in caliber. Three vessel takeoff of the great arteries. The brachiocephalic artery is widely patent. Right carotid system: The common carotid and internal carotid arteries are normal in  caliber and unremarkable in appearance. Left carotid system: The common carotid and internal carotid arteries are normal in caliber and unremarkable in appearance. Vertebral arteries:The vertebral arteries are patent bilaterally. The left vertebral artery is dominant and the right vertebral artery is diminutive or hypoplastic. Skeleton: Status post bilateral posterolateral fusion of the cervical spine. Other neck: Negative. CTA HEAD Anterior circulation: The cranial and cavernous segments of the internal carotid arteries demonstrate no significant stenosis. The anterior and middle cerebral arteries and their branches are normal in caliber. No evidence of aneurysm, large vessel occlusion or flow-limiting stenosis. Posterior circulation: The vertebrobasilar system is unremarkable. The posterior cerebral arteries and cerebellar arteries are patent. Venous sinuses: Patent. Anatomic variants: None. Delayed phase: Unremarkable. CT Brain Perfusion Findings: ASPECTS: 10 CBF (<30%) Volume: 0mL Perfusion (Tmax>6.0s) volume: 0mL Mismatch Volume: 0mL Infarction Location:Not applicable. IMPRESSION: 1. Negative CT angiogram of the neck. 2. Negative CT angiogram of the head. 3. Normal CT perfusion of the head. These results were called by telephone at the time of interpretation on 03/09/2024 at 2:12 pm to provider Dr. AISHA SEALS , who verbally acknowledged these results. Electronically Signed   By: Evalene Coho M.D.   On: 03/09/2024 14:15   CT HEAD CODE STROKE WO CONTRAST Result Date: 03/09/2024 CLINICAL DATA:  Code stroke. Unbalance with several falls and black spots in peripheral vision. Nausea and dizziness. EXAM: CT HEAD WITHOUT CONTRAST TECHNIQUE: Contiguous axial images were obtained from the base of the skull through the vertex without intravenous contrast. RADIATION DOSE REDUCTION: This exam was performed according to the departmental dose-optimization program which includes automated exposure control,  adjustment of the mA and/or kV according to patient size and/or use of iterative reconstruction technique. COMPARISON:  MRI of the head dated October 03, 2010. FINDINGS: Brain: Age-related atrophy. Mild periventricular white matter disease. No evidence of intracranial injury. No evidence of hemorrhage, mass, cortical infarct or hydrocephalus. Vascular: Vascular calcifications. Skull: Intact and unremarkable. Mild right parietal scalp soft tissue swelling. Sinuses/Orbits: Clear paranasal sinuses.  Normal orbits. Other: None. ASPECTS [facility] Stroke Program Early CT Score) - Ganglionic level infarction (caudate, lentiform nuclei, internal capsule, insula, M1-M3 cortex): 7. - Supraganglionic infarction (M4-M6 cortex): 3. Total score (0-10 with 10 being normal): 10. IMPRESSION: 1. Age-related atrophy.  No apparent acute process. 2. ASPECTS is 10. 3. These results were called by telephone at the time of interpretation on 03/09/2024 at 1:55 pm to provider Mr. Wonda GEORGIA, who verbally acknowledged these results. Electronically Signed   By: Evalene Coho M.D.   On: 03/09/2024 13:57   (Echo, Carotid, EGD, Colonoscopy, ERCP)    Subjective: No complaints  Discharge Exam: Vitals:   03/10/24 1944 03/11/24 0103  BP:  110/60  Pulse: 78 89  Resp: 18 18  Temp: 97.7 F (36.5 C) 97.8 F (36.6 C)  SpO2: 97% 96%   Vitals:   03/10/24 1226 03/10/24 1706 03/10/24 1944 03/11/24 0103  BP: 131/79 (!) 144/89 129/74 110/60  Pulse: 82 76 78 89  Resp: 17 18 18 18   Temp: 98 F (36.7 C) 97.6 F (36.4 C) 97.7 F (36.5 C) 97.8 F (36.6 C)  TempSrc:   Oral Oral  SpO2: 97% 96%  97% 96%  Weight:      Height:        General: Pt is alert, awake, not in acute distress Cardiovascular: RRR, S1/S2 +, no rubs, no gallops Respiratory: CTA bilaterally, no wheezing, no rhonchi Abdominal: Soft, NT, ND, bowel sounds + Extremities: no edema, no cyanosis    The results of significant diagnostics from this hospitalization  (including imaging, microbiology, ancillary and laboratory) are listed below for reference.     Microbiology: No results found for this or any previous visit (from the past 240 hours).   Labs: BNP (last 3 results) No results for input(s): BNP in the last 8760 hours. Basic Metabolic Panel: Recent Labs  Lab 03/09/24 1345  NA 137  K 4.3  CL 101  CO2 22  GLUCOSE 100*  BUN 17  CREATININE 1.27*  CALCIUM  10.5*   Liver Function Tests: Recent Labs  Lab 03/09/24 1345  AST 25  ALT 26  ALKPHOS 61  BILITOT 0.8  PROT 7.5  ALBUMIN 5.0   No results for input(s): LIPASE, AMYLASE in the last 168 hours. No results for input(s): AMMONIA in the last 168 hours. CBC: Recent Labs  Lab 03/09/24 1345  WBC 6.8  NEUTROABS 3.9  HGB 16.7  HCT 48.2  MCV 85.3  PLT 240   Cardiac Enzymes: No results for input(s): CKTOTAL, CKMB, CKMBINDEX, TROPONINI in the last 168 hours. BNP: Invalid input(s): POCBNP CBG: Recent Labs  Lab 03/09/24 1329  GLUCAP 108*   D-Dimer No results for input(s): DDIMER in the last 72 hours. Hgb A1c Recent Labs    03/10/24 0250  HGBA1C 5.0   Lipid Profile Recent Labs    03/10/24 0250  CHOL 166  HDL 32*  LDLCALC 76  TRIG 710*  CHOLHDL 5.2   Thyroid  function studies No results for input(s): TSH, T4TOTAL, T3FREE, THYROIDAB in the last 72 hours.  Invalid input(s): FREET3 Anemia work up No results for input(s): VITAMINB12, FOLATE, FERRITIN, TIBC, IRON, RETICCTPCT in the last 72 hours. Urinalysis    Component Value Date/Time   LABSPEC 1.025 09/24/2017 1038   BILIRUBINUR negative 09/24/2017 1038   BILIRUBINUR n 11/13/2016 1548   KETONESUR negative 09/24/2017 1038   PROTEINUR negative 09/24/2017 1038   PROTEINUR trace 11/13/2016 1548   UROBILINOGEN negative 11/13/2016 1548   NITRITE Negative 09/24/2017 1038   NITRITE n 11/13/2016 1548   LEUKOCYTESUR Negative 09/24/2017 1038   Sepsis Labs Recent Labs   Lab 03/09/24 1345  WBC 6.8   Microbiology No results found for this or any previous visit (from the past 240 hours).   Time coordinating discharge: Over 35 minutes  SIGNED:   Erle Odell Castor, MD  [facility] 03/11/2024, 8:13 AM Pager   If 7PM-7AM, please contact night-coverage [email or web address] Password [email or web address]

## 2024-03-13 ENCOUNTER — Telehealth: Payer: Self-pay

## 2024-03-13 ENCOUNTER — Ambulatory Visit (INDEPENDENT_AMBULATORY_CARE_PROVIDER_SITE_OTHER): Admitting: Licensed Clinical Social Worker

## 2024-03-13 DIAGNOSIS — F411 Generalized anxiety disorder: Secondary | ICD-10-CM | POA: Diagnosis not present

## 2024-03-13 DIAGNOSIS — F902 Attention-deficit hyperactivity disorder, combined type: Secondary | ICD-10-CM

## 2024-03-13 NOTE — Transitions of Care (Post Inpatient/ED Visit) (Signed)
 03/13/2024  Name: Parker Silva MRN: [id number] DOB: [birth year]  Today's TOC FU Call Status: Today's TOC FU Call Status:: Successful TOC FU Call Completed TOC FU Call Complete Date: 03/13/24 Patient's Name and Date of Birth confirmed.  Transition Care Management Follow-up Telephone Call Date of Discharge: 03/11/24 Discharge Facility: [facility]) Type of Discharge: Inpatient Admission Primary Inpatient Discharge Diagnosis:: [place] How have you been since you were released from the hospital?: Better Any questions or concerns?: No  Items Reviewed: Did you receive and understand the discharge instructions provided?: Yes Medications obtained,verified, and reconciled?: Yes (Medications Reviewed) Any new allergies since your discharge?: No Dietary orders reviewed?: Yes Do you have support at home?: Yes People in Home [RPT]: spouse  Medications Reviewed Today: Medications Reviewed Today     Reviewed by Emmitt Pan, LPN (Licensed Practical Nurse) on 03/13/24 at 1707  Med List Status: <None>   Medication Order Taking? Sig Documenting Provider Last Dose Status Informant  aspirin  EC 81 MG tablet [id number]  Take 1 tablet (81 mg total) by mouth daily. Swallow whole.  Patient not taking: Reported on 03/13/2024   Odell Celinda Balo, MD  Active   buPROPion  ([name]) 200 MG 12 hr tablet [id number] Yes Take 1 tablet (200 mg total) by mouth 2 (two) times daily. Joyce Norleen [name], MD  Active Self           Med Note LEONARDO, KIMBERLY L   Thu Mar 09, 2024  2:34 PM) 200 mg in moring and 100 mg in the evening  cyclobenzaprine  (FLEXERIL ) 10 MG tablet [id number] Yes Take 1 tablet (10 mg total) by mouth at bedtime. Dohmeier, Dedra, MD  Active Self  gabapentin  (NEURONTIN ) 100 MG capsule [id number] Yes Take 3 capsules (300 mg total) by mouth at bedtime. Sherryl Bouchard, NP  Active Self  Magnesium 500 MG TABS [id number] Yes Take 500 mg by mouth daily.  [provider]  Active Self            Med Note CHRISTIE ALEXANDER   Thu Mar 28, 2020 10:16 AM)    methylphenidate  36 MG PO CR tablet [id number] Yes Take 1 tablet (36 mg total) by mouth daily. Joyce Norleen [name], MD  Active Self  rOPINIRole  (REQUIP ) 0.5 MG tablet [id number] Yes Take 1.5 tablets (0.75 mg total) by mouth at bedtime. Ok to take 1/2 tablet in the middle of night for breakthrough symptoms Dohmeier, Dedra, MD  Active Self           Med Note LEONARDO, KIMBERLY L   Thu Mar 09, 2024  2:38 PM)    rosuvastatin  (CRESTOR ) 5 MG tablet [id number]  Take 1 tablet (5 mg total) by mouth daily.  Patient not taking: Reported on 03/13/2024   Odell Celinda Balo, MD  Active   sildenafil  (REVATIO ) 20 MG tablet [id number] Yes Take up to 5 at one time as needed for erectile dysfunction Joyce Norleen [name], MD  Active Self           Med Note SAMULE, [facility]   Fri Apr 03, 2022  3:49 PM) Prn last dose a month ago   traZODone  (DESYREL ) 150 MG tablet [id number] Yes TAKE 1 & 1/2 TABLETS AT BEDTIME AS NEEDED FOR SLEEP. Dohmeier, Dedra, MD  Active Self           Med Note LEONARDO, KIMBERLY L   Thu Mar 09, 2024  2:38 PM) Pt took 2 tablets last night  Home Care and Equipment/Supplies: Were Home Health Services Ordered?: NA Any new equipment or medical supplies ordered?: NA  Functional Questionnaire: Do you need assistance with bathing/showering or dressing?: No Do you need assistance with meal preparation?: No Do you need assistance with eating?: No Do you have difficulty maintaining continence: No Do you need assistance with getting out of bed/getting out of a chair/moving?: No Do you have difficulty managing or taking your medications?: No  Follow up appointments reviewed: PCP Follow-up appointment confirmed?: No (declined appt) MD Provider Line Number:[phone] Given: No Specialist Hospital Follow-up appointment confirmed?: NA Do you need transportation to your follow-up appointment?: No Do you understand care  options if your condition(s) worsen?: Yes-patient verbalized understanding    SIGNATURE Julian Lemmings, LPN [facility] Nurse Health Advisor Direct Dial [phone]
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
## 2024-03-14 DIAGNOSIS — Z136 Encounter for screening for cardiovascular disorders: Secondary | ICD-10-CM | POA: Diagnosis not present

## 2024-03-14 DIAGNOSIS — E781 Pure hyperglyceridemia: Secondary | ICD-10-CM | POA: Diagnosis not present

## 2024-03-15 ENCOUNTER — Ambulatory Visit: Payer: Self-pay | Admitting: Internal Medicine

## 2024-03-15 DIAGNOSIS — E781 Pure hyperglyceridemia: Secondary | ICD-10-CM

## 2024-03-15 LAB — NMR, LIPOPROFILE
Cholesterol, Total: 162 mg/dL (ref 100–199)
HDL Particle Number: 27.6 umol/L — AB (ref >=30.5)
HDL-C: 29 mg/dL — AB (ref >39)
LDL Particle Number: 1354 nmol/L — AB (ref <1000)
LDL Size: 20 nm — AB (ref >20.5)
LDL-C (NIH Calc): 102 mg/dL — AB (ref 0–99)
LP-IR Score: 81 — AB (ref <=45)
Small LDL Particle Number: 972 nmol/L — AB (ref <=527)
Triglycerides: 174 mg/dL — AB (ref 0–149)

## 2024-03-15 LAB — HEMOGLOBIN A1C
Est. average glucose Bld gHb Est-mCnc: 108 mg/dL
Hgb A1c MFr Bld: 5.4 % (ref 4.8–5.6)

## 2024-03-15 LAB — LIPOPROTEIN A (LPA): Lipoprotein (a): <8.4 nmol/L (ref <75.0)

## 2024-03-21 ENCOUNTER — Ambulatory Visit (HOSPITAL_BASED_OUTPATIENT_CLINIC_OR_DEPARTMENT_OTHER)
Admission: RE | Admit: 2024-03-21 | Discharge: 2024-03-21 | Disposition: A | Discharge status: Home / Self Care | Payer: Self-pay | Source: Ambulatory Visit | Attending: Internal Medicine | Admitting: Internal Medicine

## 2024-03-21 ENCOUNTER — Other Ambulatory Visit: Payer: Self-pay

## 2024-03-21 DIAGNOSIS — E781 Pure hyperglyceridemia: Secondary | ICD-10-CM | POA: Insufficient documentation

## 2024-03-21 DIAGNOSIS — Z136 Encounter for screening for cardiovascular disorders: Secondary | ICD-10-CM | POA: Insufficient documentation

## 2024-03-25 ENCOUNTER — Other Ambulatory Visit: Payer: Self-pay | Admitting: Family Medicine

## 2024-03-25 DIAGNOSIS — F909 Attention-deficit hyperactivity disorder, unspecified type: Secondary | ICD-10-CM

## 2024-03-25 MED ORDER — METHYLPHENIDATE HCL ER (OSM) 36 MG PO TBCR: 36.0000 mg | EXTENDED_RELEASE_TABLET | Freq: Every day | ORAL | 0 refills | Status: DC

## 2024-03-28 ENCOUNTER — Ambulatory Visit: Admitting: Licensed Clinical Social Worker

## 2024-03-30 DIAGNOSIS — K080 Exfoliation of teeth due to systemic causes: Secondary | ICD-10-CM | POA: Diagnosis not present

## 2024-04-03 ENCOUNTER — Other Ambulatory Visit: Payer: Self-pay | Admitting: Adult Health

## 2024-04-04 ENCOUNTER — Other Ambulatory Visit: Payer: Self-pay | Admitting: Adult Health

## 2024-04-12 ENCOUNTER — Telehealth: Payer: Self-pay | Admitting: Family Medicine

## 2024-04-12 MED ORDER — ROSUVASTATIN CALCIUM 5 MG PO TABS: 5.0000 mg | ORAL_TABLET | Freq: Every day | ORAL | 3 refills | Status: AC

## 2024-04-12 NOTE — Telephone Encounter (Signed)
 [facility] req refill Rosuvastatin  Calcium  5 mg #30

## 2024-04-25 ENCOUNTER — Other Ambulatory Visit: Payer: Self-pay | Admitting: Family Medicine

## 2024-04-25 DIAGNOSIS — F909 Attention-deficit hyperactivity disorder, unspecified type: Secondary | ICD-10-CM

## 2024-04-25 MED ORDER — METHYLPHENIDATE HCL ER (OSM) 36 MG PO TBCR: 36.0000 mg | EXTENDED_RELEASE_TABLET | Freq: Every day | ORAL | 0 refills | Status: DC

## 2024-04-25 NOTE — Telephone Encounter (Signed)
 Copied from CRM [id number]. Topic: Clinical - Medication Refill >> Apr 25, 2024 12:46 PM Rachelle R wrote: Medication: methylphenidate  36 MG PO CR tablet  Has the patient contacted their pharmacy? Yes, call dr  This is the patient's preferred pharmacy:  [id number] [place] [place], [place] - [address] [place] [address] Phone: [phone] Fax: [phone]  Is this the correct pharmacy for this prescription? Yes If no, delete pharmacy and type the correct one.   Has the prescription been filled recently? No  Is the patient out of the medication? Yes  Has the patient been seen for an appointment in the last year OR does the patient have an upcoming appointment? Yes  Can we respond through MyChart? Yes  Agent: Please be advised that Rx refills may take up to 3 business days. We ask that you follow-up with your pharmacy.

## 2024-04-27 ENCOUNTER — Ambulatory Visit: Admitting: Licensed Clinical Social Worker

## 2024-05-23 ENCOUNTER — Ambulatory Visit (INDEPENDENT_AMBULATORY_CARE_PROVIDER_SITE_OTHER): Admitting: Family Medicine

## 2024-05-23 ENCOUNTER — Encounter: Payer: Self-pay | Admitting: Family Medicine

## 2024-05-23 VITALS — BP 126/78 | HR 88 | Ht 72.0 in | Wt 191.0 lb

## 2024-05-23 DIAGNOSIS — H938X3 Other specified disorders of ear, bilateral: Secondary | ICD-10-CM | POA: Diagnosis not present

## 2024-05-23 DIAGNOSIS — R067 Sneezing: Secondary | ICD-10-CM | POA: Diagnosis not present

## 2024-05-23 DIAGNOSIS — J301 Allergic rhinitis due to pollen: Secondary | ICD-10-CM | POA: Diagnosis not present

## 2024-05-23 DIAGNOSIS — J0110 Acute frontal sinusitis, unspecified: Secondary | ICD-10-CM | POA: Diagnosis not present

## 2024-05-23 DIAGNOSIS — F909 Attention-deficit hyperactivity disorder, unspecified type: Secondary | ICD-10-CM | POA: Diagnosis not present

## 2024-05-23 LAB — POC COVID19 BINAXNOW: SARS Coronavirus 2 Ag: NEGATIVE

## 2024-05-23 MED ORDER — AMOXICILLIN-POT CLAVULANATE 875-125 MG PO TABS: 1.0000 | ORAL_TABLET | Freq: Two times a day (BID) | ORAL | 0 refills | Status: DC

## 2024-05-23 MED ORDER — METHYLPHENIDATE HCL ER (OSM) 36 MG PO TBCR: 36.0000 mg | EXTENDED_RELEASE_TABLET | Freq: Every day | ORAL | 0 refills | Status: DC

## 2024-05-23 NOTE — Addendum Note (Signed)
 Addended by: JOHNNYE JON SQUIBB on: 05/23/2024 04:32 PM   Modules accepted: Orders

## 2024-05-23 NOTE — Progress Notes (Signed)
   Subjective:    Patient ID: Parker Silva, male    DOB: Mar 29, 1956, 68 y.o.   MRN: [id number]  Discussed the use of AI scribe software for clinical note transcription with the patient, who gave verbal consent to proceed.  History of Present Illness   Parker Silva is a 68 year old male who presents with symptoms of sinus congestion and possible sinus infection.  Symptoms began approximately two weeks ago with a sore throat, followed by nasal congestion. The congestion feels like it is moving toward his chest, and he is experiencing difficulty hearing due to ear congestion. He has sneezing, coughing, watery eyes, and drainage down the back of his throat. He reports sinus pressure in the maxillary and frontal sinuses, accompanied by sinus headaches occurring about three times in the past two weeks.  He has a history of seasonal allergies, particularly in the fall, which he attributes to activities such as blowing and mowing. He did not experience significant allergy symptoms in the spring this year. He believes his current symptoms may be related to his allergies.  He is currently taking Concerta  for ADHD, which lasts about nine hours. He does not report any issues when the medication wears off, other than becoming more relaxed and distracted.  No significant watering of the eyes but confirms sneezing and coughing. He reports sinus pressure and headaches, and difficulty hearing due to ear congestion.           Review of Systems     Objective:    Physical Exam Alert and in no distress. Tympanic membrane on the right is normal, left difficult to see due to cerumen  Pharyngeal area is normal.  Slight tenderness to palpation over the maxillary sinuses and also to a lesser extent over frontal sinuses neck is supple without adenopathy or thyromegaly. Cardiac exam shows a regular sinus rhythm without murmurs or gallops. Lungs are clear to auscultation.           Assessment &  Plan:  Acute sinusitis with persistent symptoms for two weeks, including nasal congestion, sinus pressure, and headaches. Possible allergic rhinitis exacerbation. - Prescribed Augmentin  for 10 days. - Instructed to call if symptoms persist after 10 days for potential extension of antibiotics. - Advised to leave a message on MyChart if symptoms persist.  Allergic rhinitis Chronic allergic rhinitis with seasonal exacerbation contributing to sinusitis.  Earwax management Discussed earwax management to prevent ear fullness and hearing difficulties. - Recommended over-the-counter earwax cleaning solution. - Advised to consult pharmacist for appropriate product.  Attention-deficit hyperactivity disorder ADHD managed with Concerta , effective for nine hours per dose. - Renewed Concerta  prescription.

## 2024-05-26 ENCOUNTER — Other Ambulatory Visit: Payer: Self-pay | Admitting: Family Medicine

## 2024-05-26 DIAGNOSIS — F909 Attention-deficit hyperactivity disorder, unspecified type: Secondary | ICD-10-CM

## 2024-05-26 NOTE — Telephone Encounter (Signed)
 Copied from CRM [id number]. Topic: Clinical - Medication Refill >> May 26, 2024  9:49 AM Everette C wrote: Medication: methylphenidate  36 MG PO CR tablet  Has the patient contacted their pharmacy? Yes (Agent: If no, request that the patient contact the pharmacy for the refill. If patient does not wish to contact the pharmacy document the reason why and proceed with request.) (Agent: If yes, when and what did the pharmacy advise?)  This is the patient's preferred pharmacy:  [id number] [place], [place] - [address] [place] [address] Phone: [phone] Fax: [phone]  Is this the correct pharmacy for this prescription? Yes If no, delete pharmacy and type the correct one.   Has the prescription been filled recently? Yes  Is the patient out of the medication? Yes  Has the patient been seen for an appointment in the last year OR does the patient have an upcoming appointment? Yes  Can we respond through MyChart? No  Agent: Please be advised that Rx refills may take up to 3 business days. We ask that you follow-up with your pharmacy.

## 2024-05-26 NOTE — Telephone Encounter (Signed)
 Last apt 02/22/24

## 2024-05-27 ENCOUNTER — Other Ambulatory Visit: Payer: Self-pay | Admitting: Family Medicine

## 2024-05-27 DIAGNOSIS — F909 Attention-deficit hyperactivity disorder, unspecified type: Secondary | ICD-10-CM

## 2024-05-27 MED ORDER — METHYLPHENIDATE HCL ER (OSM) 36 MG PO TBCR: 36.0000 mg | EXTENDED_RELEASE_TABLET | Freq: Every day | ORAL | 0 refills | Status: DC

## 2024-05-31 ENCOUNTER — Encounter: Payer: Self-pay | Admitting: Family Medicine

## 2024-05-31 DIAGNOSIS — J0110 Acute frontal sinusitis, unspecified: Secondary | ICD-10-CM

## 2024-05-31 MED ORDER — AMOXICILLIN-POT CLAVULANATE 875-125 MG PO TABS: 1.0000 | ORAL_TABLET | Freq: Two times a day (BID) | ORAL | 0 refills | Status: DC

## 2024-06-26 ENCOUNTER — Other Ambulatory Visit: Payer: Self-pay | Admitting: Family Medicine

## 2024-06-26 DIAGNOSIS — F909 Attention-deficit hyperactivity disorder, unspecified type: Secondary | ICD-10-CM

## 2024-06-26 NOTE — Telephone Encounter (Unsigned)
 Copied from CRM #[id number]. Topic: Clinical - Medication Refill >> Jun 26, 2024  9:40 AM Ashley R wrote: Medication:   methylphenidate  36 MG PO CR tablet    Has the patient contacted their pharmacy? Yes   [id number] [place] [place], [place] - [address] [place] [address] Phone: [phone] Fax: [phone]  Is this the correct pharmacy for this prescription? Yes If no, delete pharmacy and type the correct one.   Has the prescription been filled recently? Yes  Is the patient out of the medication? Yes  Has the patient been seen for an appointment in the last year OR does the patient have an upcoming appointment? Yes  Can we respond through MyChart? No  Agent: Please be advised that Rx refills may take up to 3 business days. We ask that you follow-up with your pharmacy.

## 2024-06-27 MED ORDER — METHYLPHENIDATE HCL ER (OSM) 36 MG PO TBCR: 36.0000 mg | EXTENDED_RELEASE_TABLET | Freq: Every day | ORAL | 0 refills | Status: DC

## 2024-07-27 ENCOUNTER — Other Ambulatory Visit: Payer: Self-pay | Admitting: Family Medicine

## 2024-07-27 DIAGNOSIS — F341 Dysthymic disorder: Secondary | ICD-10-CM

## 2024-07-28 ENCOUNTER — Telehealth: Payer: Self-pay

## 2024-07-28 ENCOUNTER — Other Ambulatory Visit: Payer: Self-pay | Admitting: Family Medicine

## 2024-07-28 DIAGNOSIS — F909 Attention-deficit hyperactivity disorder, unspecified type: Secondary | ICD-10-CM

## 2024-07-28 NOTE — Telephone Encounter (Unsigned)
 Copied from CRM [id number]. Topic: Clinical - Medication Refill >> Jul 28, 2024  1:46 PM Wess S wrote: Medication: methylphenidate  36 MG PO CR tablet  Has the patient contacted their pharmacy? No (Agent: If no, request that the patient contact the pharmacy for the refill. If patient does not wish to contact the pharmacy document the reason why and proceed with request.) (Agent: If yes, when and what did the pharmacy advise?)  This is the patient's preferred pharmacy:   [id number] [place], [place] - [address] [place] [address] Phone: [phone] Fax: [phone]  Is this the correct pharmacy for this prescription? Yes If no, delete pharmacy and type the correct one.   Has the prescription been filled recently? Yes  Is the patient out of the medication? Yes  Has the patient been seen for an appointment in the last year OR does the patient have an upcoming appointment? Yes  Can we respond through MyChart? No. Prefer a call  Agent: Please be advised that Rx refills may take up to 3 business days. We ask that you follow-up with your pharmacy.

## 2024-07-28 NOTE — Telephone Encounter (Signed)
 Copied from CRM [id number]. Topic: Clinical - Prescription Issue >> Jul 28, 2024  1:44 PM Parker Silva wrote: Reason for CRM:  Patient stated he has the wrong dosage for buPROPion  ([name]). It should be the 150 MG instead of the 200 MG  Callback #: [id number]  Pharmacy:  [facility] [place], [place] - [address] [name] [address] Phone: [phone] Fax: [phone] Hours: Not open 24 hours

## 2024-07-29 MED ORDER — BUPROPION HCL ER (SR) 150 MG PO TB12: 150.0000 mg | ORAL_TABLET | Freq: Every day | ORAL | 1 refills | Status: AC

## 2024-07-29 MED ORDER — METHYLPHENIDATE HCL ER (OSM) 36 MG PO TBCR: 36.0000 mg | EXTENDED_RELEASE_TABLET | Freq: Every day | ORAL | 0 refills | Status: DC

## 2024-07-29 NOTE — Addendum Note (Signed)
 Addended by: JOYCE NORLEEN [name] on: 07/29/2024 01:59 PM   Modules accepted: Orders

## 2024-08-28 DIAGNOSIS — F909 Attention-deficit hyperactivity disorder, unspecified type: Secondary | ICD-10-CM

## 2024-08-28 NOTE — Telephone Encounter (Signed)
 Copied from CRM [id number]. Topic: Clinical - Medication Refill >> Aug 28, 2024 11:26 AM Gattis SQUIBB wrote: Medication: Methylphenidate  36 mg  Has the patient contacted their pharmacy? No  This is the patient's preferred pharmacy:   [id number] [place], [place] - [address] [place] [address] Phone: [phone] Fax: [phone]  Is this the correct pharmacy for this prescription? Yes If no, delete pharmacy and type the correct one.   Has the prescription been filled recently? Yes  Is the patient out of the medication? Yes  Has the patient been seen for an appointment in the last year OR does the patient have an upcoming appointment? Yes  Can we respond through MyChart? No  Agent: Please be advised that Rx refills may take up to 3 business days. We ask that you follow-up with your pharmacy.

## 2024-08-29 MED ORDER — METHYLPHENIDATE HCL ER (OSM) 36 MG PO TBCR: 36.0000 mg | EXTENDED_RELEASE_TABLET | Freq: Every day | ORAL | 0 refills | Status: DC

## 2024-09-06 NOTE — Progress Notes (Signed)
 "          Provider:  Dedra Gores, MD  Primary Care Physician:  Joyce Norleen [name], MD [address] [place] [address]     Referring Provider: Joyce Norleen [name], Md [address] [place],  [place] [address]          Chief Complaint according to patient   Patient presents with:                HISTORY OF PRESENT ILLNESS:  Parker Silva is a 69 y.o. male patient who is here for revisit 09/07/2024 for :  RLS  The RLS hasn't bothered him recently and he maintains the current medication.  No problems to discuss today, everything stable.        Chief concern according to patient :  none       Was seen by Erle Castor , MD : ED  69 y.o. male past medical history significant for last, [name] and depression, ADHD comes in with difficulty walking for the past 2 to 3 days, MRI of the brain showed no acute findings, CT of the head showed no acute findings CTA of head and neck showed no LVO he was loaded with aspirin  and Plavix .   He had been working outdoors, was dehydrated and suspected to have a heat stroke.    Discharge Diagnoses:  Principal Problem:   Ataxia Active Problems:   ADHD (attention deficit hyperactivity disorder)   RLS (restless legs syndrome)   Anxiety and depression   Chronic pain   Ataxia likely due to vestibular dysfunction: Neurology was consulted. HgbA1c 5.0, fasting lipid panel HDL less than 40 LDL 76 MRI no acute CVA PT, OT, evaluated the patient will need outpatient PT CTA of the head and neck showed no LVO Transthoracic Echo, was normal;   Dr Vina Gull followed him, he remains on a statin.  Neurology recommended aspirin  81 mg. Continue statins. No events telemetry monitoring.  Symptoms now resolved.   RLS: Continue Requip .   Anxiety/depression/ADHD: No changes made to his medication.   Chronic pain syndrome: Continue gabapentin .     Review of Systems: Out of a complete 14 system review, the patient complains  of only the following symptoms, and all other reviewed systems are negative.:    Sleeping well.      Social History   Socioeconomic History   Marital status: Single    Spouse name: Not on file   Number of children: 0   Years of education: Not on file   Highest education level: Not on file  Occupational History   Occupation: owns visual merchandiser business    Employer: [employer]  Tobacco Use   Smoking status: Never   Smokeless tobacco: Never  Vaping Use   Vaping status: Never Used  Substance and Sexual Activity   Alcohol use: Not Currently    Comment: 3 times a year   Drug use: No   Sexual activity: Not Currently  Other Topics Concern   Not on file  Social History Narrative   Lives alone in a one story home in [place].   Right handed   Caffeine: none    Social Drivers of Health   Tobacco Use: Low Risk (09/07/2024)   Patient History    Smoking Tobacco Use: Never    Smokeless Tobacco Use: Never    Passive Exposure: Not on file  Financial Resource Strain: Low Risk (02/15/2024)   Overall Financial Resource Strain (CARDIA)  Difficulty of Paying Living Expenses: Not hard at all  Food Insecurity: No Food Insecurity (03/09/2024)   Epic    Worried About Programme Researcher, Broadcasting/film/video in the Last Year: Never true    Ran Out of Food in the Last Year: Never true  Transportation Needs: No Transportation Needs (03/09/2024)   Epic    Lack of Transportation (Medical): No    Lack of Transportation (Non-Medical): No  Physical Activity: Insufficiently Active (02/15/2024)   Exercise Vital Sign    Days of Exercise per Week: 3 days    Minutes of Exercise per Session: 20 min  Stress: Stress Concern Present (02/15/2024)   [employer] of Occupational Health - Occupational Stress Questionnaire    Feeling of Stress: To some extent  Social Connections: Moderately Isolated (03/10/2024)   Social Connection and Isolation Panel    Frequency of Communication with Friends and Family:  Twice a week    Frequency of Social Gatherings with Friends and Family: Twice a week    Attends Religious Services: Never    Database Administrator or Organizations: No    Attends Banker Meetings: Never    Marital Status: Married  Depression (PHQ2-9): Medium Risk (02/15/2024)   Depression (PHQ2-9)    PHQ-2 Score: 6  Alcohol Screen: Low Risk (02/15/2024)   Alcohol Screen    Last Alcohol Screening Score (AUDIT): 0  Housing: Low Risk (03/09/2024)   Epic    Unable to Pay for Housing in the Last Year: No    Number of Times Moved in the Last Year: 0    Homeless in the Last Year: No  Utilities: Not At Risk (03/09/2024)   Epic    Threatened with loss of utilities: No  Health Literacy: Adequate Health Literacy (02/15/2024)   B1300 Health Literacy    Frequency of need for help with medical instructions: Never    Family History  Problem Relation Age of Onset   Breast cancer Mother    Diabetes type II Father    Heart failure Father    COPD Father    Colon cancer Neg Hx    Rectal cancer Neg Hx    Stomach cancer Neg Hx     Past Medical History:  Diagnosis Date   ADHD (attention deficit hyperactivity disorder)    Allergy    RHINITIS-seasonal   Anal fissure    Arthritis    back,neck   Dyslexia    ED (erectile dysfunction)    Hemorrhoids    Hemorrhoids, internal, with bleeding 02/13/2015   Grade 2 hemorrhoids 02/13/15 band ligation right posterior hemorrhoidal bundle 03/04/15 band ligation right anterior hemorrhoidal bundle 04/15/15 band ligation left lateral hemorrhoidal bundle    History of migraine headaches    Sleep disturbance    Tension headache     Past Surgical History:  Procedure Laterality Date   APPENDECTOMY     COLONOSCOPY  2006   HEMORRHOID BANDING     POSTERIOR CERVICAL FUSION/FORAMINOTOMY N/A 04/08/2020   Procedure: POSTERIOR CERVICAL FUSION/FORAMINOTOMY CERVICAL TWO- CERVICAL SEVEN, LAMINECTOMY CERVICAL TWO- CERVICAL FIVE.;  Surgeon: Joshua Alm RAMAN, MD;   Location: [facility];  Service: Neurosurgery;  Laterality: N/A;  posterior   TONSILLECTOMY       Medications Ordered Prior to Encounter[1]  Allergies[2]   DIAGNOSTIC DATA (LABS, IMAGING, TESTING) - I reviewed patient records, labs, notes, testing and imaging myself where available.  Lab Results  Component Value Date   WBC 6.8 03/09/2024   HGB 16.7 03/09/2024  HCT 48.2 03/09/2024   MCV 85.3 03/09/2024   PLT 240 03/09/2024      Component Value Date/Time   NA 137 03/09/2024 1345   NA 138 02/18/2023 1559   K 4.3 03/09/2024 1345   CL 101 03/09/2024 1345   CO2 22 03/09/2024 1345   GLUCOSE 100 (H) 03/09/2024 1345   BUN 17 03/09/2024 1345   BUN 13 02/18/2023 1559   CREATININE 1.27 (H) 03/09/2024 1345   CREATININE 1.26 11/13/2016 1346   CALCIUM  10.5 (H) 03/09/2024 1345   PROT 7.5 03/09/2024 1345   PROT 6.3 02/18/2023 1559   ALBUMIN 5.0 03/09/2024 1345   ALBUMIN 4.5 02/18/2023 1559   AST 25 03/09/2024 1345   ALT 26 03/09/2024 1345   ALKPHOS 61 03/09/2024 1345   BILITOT 0.8 03/09/2024 1345   BILITOT 0.5 02/18/2023 1559   GFRNONAA >60 03/09/2024 1345   GFRAA 92 08/22/2020 1424   Lab Results  Component Value Date   CHOL 166 03/10/2024   HDL 32 (L) 03/10/2024   LDLCALC 76 03/10/2024   TRIG 289 (H) 03/10/2024   CHOLHDL 5.2 03/10/2024   Lab Results  Component Value Date   HGBA1C 5.4 03/14/2024   No results found for: VITAMINB12 Lab Results  Component Value Date   TSH 2.210 02/18/2023    PHYSICAL EXAM:  Vitals:   09/07/24 1545  BP: 137/79  Pulse: 75   No data found. Body mass index is 25.77 kg/m.   Wt Readings from Last 3 Encounters:  09/07/24 190 lb (86.2 kg)  05/23/24 191 lb (86.6 kg)  03/09/24 190 lb 11.2 oz (86.5 kg)     Ht Readings from Last 3 Encounters:  09/07/24 6' (1.829 m)  05/23/24 6' (1.829 m)  03/09/24 6' (1.829 m)      General: The patient is awake, alert and appears not in acute distress and groomed. Head: Normocephalic, atraumatic.   Neck with good ROM.  The patient is awake and alert, oriented to place and time.   Memory subjective described as intact.  Attention span & concentration ability appears normal.  Speech is fluent,  without  dysarthria, dysphonia or aphasia.  He reports word finding difficulties.  Mood and affect are depressed, concerned,     Cranial nerves: no loss of smell or taste reported  Pupils are equal and briskly reactive to light.  Extraocular movements in vertical and horizontal planes were intact and without nystagmus.  No Diplopia. Visual fields by finger perimetry are intact. Hearing was intact to soft voice and finger rubbing.    Facial sensation intact to fine touch.  Facial motor strength is symmetric and tongue and uvula move midline.  Neck ROM : rotation, tilt and flexion extension were normal for age and shoulder shrug was symmetrical.    Motor exam:  Symmetric bulk, tone and ROM.   Normal tone without cog- wheeling, symmetric grip strength .  Some atrophy of the  thenar eminences.    Sensory:  Fine touch and vibration were  intact.  Proprioception tested in the upper extremities was normal.   Coordination: Rapid alternating movements in the fingers/hands were of normal speed.  The Finger-to-nose maneuver was intact without evidence of ataxia, dysmetria or tremor.   Gait and station: Patient could rise and walked without assistive device.  Stance is of normal width/ base and the patient turned with 3 steps.  Toe and heel walk were deferred.  Deep tendon reflexes: in the  upper and lower extremities are symmetric and  intact.  Babinski response was deferred    ASSESSMENT AND PLAN :   69 y.o. year old male  here with:     1) RLS - well controlled and not too groggy  2)  has recovered well form neck surgery   3)  Dyslexia, ADHD. Adult residual form.   Orders Placed This Encounter  Procedures   Iron and TIBC    Standing Status:   Future    Expiration Date:    09/06/2025    Release to patient:   Immediate [1]   CMP and Liver    Standing Status:   Future    Expiration Date:   09/06/2025    Release to patient:   Immediate [1]      I would like to thank Joyce Norleen [name], MD and Joyce Norleen [name], Md [address] [place],  [place] [address] for allowing me to meet with this pleasant patient.    The patient's condition requires frequent monitoring and adjustments in the treatment plan, reflecting the ongoing complexity of care.  This provider is the continuing focal point for all needed services for this condition.  After spending a total time of  29  minutes face to face and time for  history taking, physical and neurologic examination, review of laboratory studies,  personal review of imaging studies, reports and results of other testing and review of referral information / records as far as provided in visit,   Electronically signed by: Dedra Gores, MD 09/07/2024 4:15 PM  [facility] Board certified by The [employer] of Sleep Medicine and Diplomate of the [employer] of Sleep Medicine. Board certified In Neurology through the ABPN, Fellow of the [employer] of Neurology.      [1]  Current Outpatient Medications on File Prior to Visit  Medication Sig Dispense Refill   aspirin  EC 81 MG tablet Take 1 tablet (81 mg total) by mouth daily. Swallow whole. 30 tablet 12   buPROPion  ([name]) 150 MG 12 hr tablet Take 1 tablet (150 mg total) by mouth daily. 90 tablet 1   cyclobenzaprine  (FLEXERIL ) 10 MG tablet Take 1 tablet (10 mg total) by mouth at bedtime. 90 tablet 1   gabapentin  (NEURONTIN ) 100 MG capsule Take 3 capsules (300 mg total) by mouth at bedtime. 270 capsule 3   Magnesium 500 MG TABS Take 500 mg by mouth daily.      methylphenidate  36 MG PO CR tablet Take 1 tablet (36 mg total) by mouth daily. 30 tablet 0   rOPINIRole  (REQUIP ) 0.5 MG tablet Take 1.5 tablets (0.75 mg total)  by mouth at bedtime. Ok to take 1/2 tablet in the middle of night for breakthrough symptoms 135 tablet 1   rosuvastatin  (CRESTOR ) 5 MG tablet Take 1 tablet (5 mg total) by mouth daily. 90 tablet 3   traZODone  (DESYREL ) 150 MG tablet TAKE 1 & 1/2 TABLETS AT BEDTIME AS NEEDED FOR SLEEP. 135 tablet 3   No current facility-administered medications on file prior to visit.  [2] No Known Allergies  "

## 2024-09-07 ENCOUNTER — Encounter: Payer: Self-pay | Admitting: Neurology

## 2024-09-07 ENCOUNTER — Other Ambulatory Visit: Payer: Self-pay

## 2024-09-07 ENCOUNTER — Ambulatory Visit: Admitting: Neurology

## 2024-09-07 VITALS — BP 137/79 | HR 75 | Ht 72.0 in | Wt 190.0 lb

## 2024-09-07 DIAGNOSIS — G2581 Restless legs syndrome: Secondary | ICD-10-CM

## 2024-09-07 MED ORDER — CYCLOBENZAPRINE HCL 10 MG PO TABS: 10.0000 mg | ORAL_TABLET | Freq: Every day | ORAL | 1 refills | Status: AC

## 2024-09-07 MED ORDER — TRAZODONE HCL 150 MG PO TABS: ORAL_TABLET | ORAL | 3 refills | Status: AC

## 2024-09-07 MED ORDER — ROPINIROLE HCL 0.5 MG PO TABS: 0.7500 mg | ORAL_TABLET | Freq: Every day | ORAL | 1 refills | Status: AC

## 2024-09-07 MED ORDER — GABAPENTIN 100 MG PO CAPS: 200.0000 mg | ORAL_CAPSULE | Freq: Every day | ORAL | 3 refills | Status: AC

## 2024-09-07 NOTE — Patient Instructions (Signed)
 Restless Legs Syndrome: What to Know Restless legs syndrome (RLS) is a condition that makes your legs feel uncomfortable, especially while sitting or lying down. This discomfort makes you want to move your legs. Sometimes, your arms can feel this way too. RLS can be mild or severe. It can make it hard for you to sleep. What are the causes? The cause of RLS isn't known. What increases the risk? You're more likely to get RLS if: You're older than 67 years of age. You're pregnant. You're male. Someone in your family has RLS. You don't have enough iron in your body. You smoke, drink alcohol, or drink too much caffeine. You have certain health problems like kidney disease, Parkinson's disease, or nerve damage. You take certain medicines for high blood pressure, feeling like you may throw up (nausea), colds, allergies, depression, or heart problems. What are the signs or symptoms? The main symptom of RLS is uncomfortable feelings in the legs, such as: Pulling. Tingling. Prickling. Throbbing. Crawling. Burning. Usually, the feelings: Happen on both sides of your body. Get worse when you sit or lie down. Get worse at night, making it hard to sleep. Make you want to move your legs. Feel better when you move your legs or stand up. Sometimes, your arms can feel this way too. People with RLS often feel tired during the day because they don't sleep well at night. How is this diagnosed? RLS may be diagnosed based on: Your symptoms. Blood tests. In some cases, you may be monitored in a sleep lab by a specialist. This is called a sleep study. It can help find out if something is stopping you from sleeping well. How is this treated? RLS is treated by managing the symptoms. This may include: Lifestyle changes, such as exercising, using relaxation techniques, and avoiding caffeine, alcohol, or tobacco. Iron supplements. Medicines. Medicines for Parkinson's disease may be tried first. Medicines  to prevent seizures may also help. Special foot wraps made for people with RLS. Vibration pads applied to the back of your legs. A device worn on the lower legs that makes the muscles move. This helps lessen the uncomfortable feelings from RLS. Follow these instructions at home: Lifestyle  Use good sleep habits. Go to bed and wake up at the same time every day. Most adults need 7-9 hours of sleep each night. Get regular exercise. Try to get at least 30 minutes of exercise most days of the week. Find ways to relax, like deep breathing, yoga, or meditation. Avoid caffeine and alcohol. Do not smoke, vape, or use nicotine or tobacco. General instructions Take your medicines only as told. Try these methods to relieve the discomfort in your legs: Massage your legs. Walk or stretch. Take a cold or warm bath. Keep all follow-up visits. Your health care provider will check if the treatment is working. Where to find more information To learn more: Go to [employer]. Click Search and type restless legs syndrome. Find the link you need. Contact a health care provider if: Your symptoms get worse. Your symptoms don't get better with treatment. This information is not intended to replace advice given to you by your health care provider. Make sure you discuss any questions you have with your health care provider. Document Revised: 11/30/2023 Document Reviewed: 11/30/2023 Elsevier Patient Education  2025 [employer].

## 2024-09-08 LAB — CMP AND LIVER
ALT: 16 IU/L (ref 0–44)
AST: 19 IU/L (ref 0–40)
Albumin: 4.8 g/dL (ref 3.9–4.9)
Alkaline Phosphatase: 55 IU/L (ref 47–123)
BUN: 17 mg/dL (ref 8–27)
Bilirubin Total: 0.4 mg/dL (ref 0.0–1.2)
Bilirubin, Direct: 0.15 mg/dL (ref 0.00–0.40)
CO2: 22 mmol/L (ref 20–29)
Calcium: 9.7 mg/dL (ref 8.6–10.2)
Chloride: 103 mmol/L (ref 96–106)
Creatinine, Ser: 1.18 mg/dL (ref 0.76–1.27)
Glucose: 92 mg/dL (ref 70–99)
Potassium: 4.3 mmol/L (ref 3.5–5.2)
Sodium: 143 mmol/L (ref 134–144)
Total Protein: 6.8 g/dL (ref 6.0–8.5)
eGFR: 68 mL/min/1.73

## 2024-09-08 LAB — IRON AND TIBC
Iron Saturation: 28 % (ref 15–55)
Iron: 89 ug/dL (ref 38–169)
Total Iron Binding Capacity: 320 ug/dL (ref 250–450)
UIBC: 231 ug/dL (ref 111–343)

## 2024-09-11 ENCOUNTER — Ambulatory Visit: Payer: Self-pay | Admitting: Neurology

## 2024-09-27 ENCOUNTER — Telehealth: Payer: Self-pay

## 2024-09-27 DIAGNOSIS — F909 Attention-deficit hyperactivity disorder, unspecified type: Secondary | ICD-10-CM

## 2024-09-27 MED ORDER — METHYLPHENIDATE HCL ER (OSM) 36 MG PO TBCR: 36.0000 mg | EXTENDED_RELEASE_TABLET | Freq: Every day | ORAL | 0 refills | Status: AC

## 2024-09-27 NOTE — Telephone Encounter (Signed)
 Copied from CRM [id number]. Topic: Clinical - Medication Refill >> Sep 27, 2024 10:46 AM Myrick T wrote: Medication: methylphenidate  36 MG PO CR tablet  Has the patient contacted their pharmacy? No  This is the patient's preferred pharmacy:   [id number] [place], [place] - [address] [place] [address] Phone: [phone] Fax: [phone]  Is this the correct pharmacy for this prescription? Yes  Has the prescription been filled recently? Yes  Is the patient out of the medication? Yes  Has the patient been seen for an appointment in the last year OR does the patient have an upcoming appointment? Yes  Can we respond through MyChart? No  Agent: Please be advised that Rx refills may take up to 3 business days. We ask that you follow-up with your pharmacy.

## 2025-02-20 ENCOUNTER — Ambulatory Visit: Payer: Self-pay

## 2025-09-10 ENCOUNTER — Ambulatory Visit: Admitting: Neurology
